# Patient Record
Sex: Male | Born: 1980 | Race: Black or African American | Hispanic: No | Smoking: Never smoker
Health system: Southern US, Community
[De-identification: ages and names within clinical notes are randomized; demographics above are authoritative.]

---

## 2019-09-28 DIAGNOSIS — E876 Hypokalemia: Secondary | ICD-10-CM | POA: Insufficient documentation

## 2019-09-28 DIAGNOSIS — I214 Non-ST elevation (NSTEMI) myocardial infarction: Secondary | ICD-10-CM | POA: Insufficient documentation

## 2019-09-28 DIAGNOSIS — I248 Other forms of acute ischemic heart disease: Secondary | ICD-10-CM | POA: Insufficient documentation

## 2019-09-28 HISTORY — DX: Non-ST elevation (NSTEMI) myocardial infarction: I21.4

## 2019-10-04 DIAGNOSIS — G009 Bacterial meningitis, unspecified: Secondary | ICD-10-CM | POA: Insufficient documentation

## 2019-11-03 ENCOUNTER — Encounter: Payer: Self-pay | Admitting: Neurology

## 2019-11-20 ENCOUNTER — Ambulatory Visit: Payer: Self-pay | Admitting: Neurology

## 2019-12-08 NOTE — Progress Notes (Signed)
NEUROLOGY CONSULTATION NOTE  Isaac [name] MRN: [id number] DOB: June 10, 1980  Referring provider: Andreas Blower, MD Primary care provider: No PCP  Reason for consult:  Bacterial meningitis  HISTORY OF PRESENT ILLNESS: Isaac [name]. [name] is a 39 year old right-handed male with history of cocaine use presents for bacterial meningitis.  History supplemented by hospital records.  He is accompanied by his fiancee who provides history.    He presented to the [facility] ED on 09/28/2019 with altered mental status and fever of 104 F, WBC 17.4 and lactate 4.7 after a day of generalized malaise, nausea, vomiting, weakness and headache.  He had elevated troponins over 5000.  Due to combativeness, he required sedation and subsequent intubation and transferred to [facility] where he underwent LP and diagnosed with pneumococcal meningitis.  He was started on ceftriaxone.  Due to possible seizure, he was started on Keppra and placed on continuous EEG which was negative for seizure activity.  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  He received a 5 day course of SoluMedrol for cerebral edema.  He then developed exteensor posturing with tachycardia and hypertension concerning for elevated intracranial pressure.  He underwent Bolt ICP monitoring which demonstrated no elevated ICP.  Follow up CT head and subsequent repeat MRI of brain showed small subacute hemorrhage in the right suboccipital lobe, either hemorrhagic conversion of small ischemic stroke vs extension of previously seen microhemorrhage, as well as hypoxic injury involving the bilateral caudate and putamen.  He also sustained a NSTEMI.  He was extubated on 10/10/2019 and underwent slow taper of Dilaudid to minimize withdrawal symptoms.  A G-tube was inserted on 10/16/2019 and he was discharged on 10/18/2019 under the care of his mother who is a CNA.    He has been going to outpatient PT/OT and has  been getting stronger.  He is still taking food and medication via G-tube.  He has not been able to establish care with a PCP yet.  He is easily angry and has kicked his mother.  He does not talk much and often when he speaks, it may be a curse word.  He is still incontinent.  He requires assistance with essential ADLs such as bathing, dressing and using toilet.  He sleeps well.    Testing: 07/25-26/2021 SERUM LABS:  Lactic acid 4.7, ammonia 20, ethanol negative, troponins 5105, CK 682, CK-MB 5.92, HIV nonreactive, Syphilis screen negative, COVID-19 PCR negative, Cryptococcal antigen negative, C3 and C4 102 and 29.1,  09/28/2019 Urine:  UDS positive for cannabinoids and benzodiazepines 09/28/2019 CT HEAD/CERVICAL SPINE:  1. No acute intracranial abnormalities. 2. No evidence for cervical spine fracture or dislocation.  09/28/2019 ECHOCARDIOGRAM:  1. The left ventricular systolic function is borderline, LVEF is visually estimated at 50%.  2. Definity contrast would clarify LV function.  3. Right ventricle is not well visualized but probably normal.  4. No evidence of an interatrial communication or intrapulmonary shunt.  5. No significant valvular abnormalities.  6. Technically difficult study due to body habitus.  09/29/2019 CSF:  Cell count 3560, 93% neutrophil, protein 187, glucose 13, gram stain  And culture negative, acid fast stain negative, VDRL nonreactive, cryptococcal ag negative, fungal culture negative, VZV PCR negative, ACE negative, HSV 1 & 2 PCR negative, cytology with severe acute inflammation but negative for malignant cells. 09/29/2019 CT H EAD WO:  New multifocal cerebral edema with progressive mild effacement of the ventricles. Recommend correlation with forthcoming  MRI.  09/29/2019 MRI BRAIN W WO:  1.Widespread acute microhemorrhagic leukoencephalopathy. Overall pattern is unusual, but given the perivascular distribution, is likely the result of infectious vasculitis.  2.Superimposed  findings of meningitis, including leptomeningeal signal abnormality and enhancement.  09/29/2019 CTA/CTV HEAD W WO:  1. Unremarkable CTA of the head. Specifically, no CT angiogram evidence of small or medium vessel vasculitis.  2. No venous thromboses.  3. Extensive supratentorial white matter hypodensities are noted and correspond to the extensive parenchymal abnormalities seen on recent brain MRI. 09/30/2019 VIDEO EEG:  Severe diffuse cerebral dysfunction, initially slightly worse over the left hemisphere, which is nonspecific for etiology but could be due in part to sedative effect in addition to known bilateral ischemic lesions   As the recording progressed, there is slight improvement in  background activity.  No seizures or epileptiform discharges.  There were occasional extensor posturing movements of both arms associated with up gaze lasting up to several minutes and no associated EEG change other than evidence of reactivity. Most of these episodes occurred in response to stimulation  10/11/2019 CT HEAD WO:  1.26 x 18 x 30 mm acute hemorrhage in the right parietal lobe the hemorrhage extends to the ependyma of the posterior right lateral ventricle. No intraventricular hemorrhage or subarachnoid hemorrhage is seen.  2.Tiny acute hemorrhage in the right frontal cortex and the tiny acute hemorrhage in the cortex of the posterior superior left temporal lobe. 10/12/2019 MRI BRAIN W WO:  1. Acute parenchymal hemorrhages right parietal lobe and superficial right frontal lobe similar to recent CT scan. Increase in number of superficial supratentorial microhemorrhages.  2. Development of restricted diffusion and T2/FLAIR hyperintensity bilateral basal ganglia indicating acute to early subacute insult such as hypoxic injury or toxic/metabolic insult. Appearance is not suggestive of parenchymal changes of infection.  3. Maturation of the extensive restricted diffusion throughout the supratentorial white matter now  seen as regions of enhancement and T2/FLAIR hyperintensity.  4. Leptomeningeal enhancement posteriorly at the vertex consistent with known meningitis; no abscess or space-occupying infected fluid collection.  10/15/2019 CT HEAD WO:  1. Evolving right parietal lobe parenchymal hematoma. The previously seen right frontal lobe hemorrhage is no longer definitively visualized. No new parenchymal hemorrhage.  2. Subtle areas of hypodensity within periventricular and subcortical white matter, corresponding to some of the areas of restricted diffusion and abnormal enhancement seen on prior MRI. However, these areas of white matter abnormality are much less conspicuous on CT compared to prior MR imaging.  3. No CT evidence of acute cortically based infarct.  10/15/2019 VIDEO EEG:  This EEG is abnormal secondary to moderate diffuse slowing.    PAST MEDICAL HISTORY: Past Medical History:  Diagnosis Date  . Bacterial meningitis 09/28/2019    PAST SURGICAL HISTORY: History reviewed. No pertinent surgical history.  MEDICATIONS: Keppra 750mg  twice daily  ALLERGIES: Not on File  FAMILY HISTORY: History reviewed. No pertinent family history..  SOCIAL HISTORY: Social History   Socioeconomic History  . Marital status: Significant Other    Spouse name: Not on file  . Number of children: Not on file  . Years of education: Not on file  . Highest education level: Not on file  Occupational History  . Not on file  Tobacco Use  . Smoking status: Never Smoker  . Smokeless tobacco: Never Used  Substance and Sexual Activity  . Alcohol use: Not Currently  . Drug use: Not Currently  . Sexual activity: Not on file  Other Topics Concern  .  Not on file  Social History Narrative   Right handed   Lives with mother right now in one story home.   Social Determinants of Health   Financial Resource Strain:   . Difficulty of Paying Living Expenses: Not on file  Food Insecurity:   . Worried About Community education officer in the Last Year: Not on file  . Ran Out of Food in the Last Year: Not on file  Transportation Needs:   . Lack of Transportation (Medical): Not on file  . Lack of Transportation (Non-Medical): Not on file  Physical Activity:   . Days of Exercise per Week: Not on file  . Minutes of Exercise per Session: Not on file  Stress:   . Feeling of Stress : Not on file  Social Connections:   . Frequency of Communication with Friends and Family: Not on file  . Frequency of Social Gatherings with Friends and Family: Not on file  . Attends Religious Services: Not on file  . Active Member of Clubs or Organizations: Not on file  . Attends Banker Meetings: Not on file  . Marital Status: Not on file  Intimate Partner Violence:   . Fear of Current or Ex-Partner: Not on file  . Emotionally Abused: Not on file  . Physically Abused: Not on file  . Sexually Abused: Not on file   PHYSICAL EXAM: Blood pressure 117/75, pulse 71, height 6\' 2"  (1.88 m), weight 180 lb (81.6 kg). General: No acute distress.  Patient appears well-groomed.  Head:  Normocephalic/atraumatic Eyes:  fundi examined but not visualized Neck: supple, no paraspinal tenderness, full range of motion Back: No paraspinal tenderness Heart: regular rate and rhythm Lungs: Clear to auscultation bilaterally. Vascular: No carotid bruits. Neurological Exam: Mental status: awake and alert.  Does not speak or follow commands. Cranial nerves: CN I: not tested CN II: pupils equal, round and reactive to light, blinks to threat bilaterally CN III, IV, VI:  full range of motion, no nystagmus, no ptosis CN V: unable to assess CN VII: upper and lower face symmetric CN VIII: unable to assess CN IX, X: unable to assess CN XI: unable to assess CN XII: unable to assess Bulk & Tone: mildly increased, no fasciculations. Motor:  Moves all extremities against gravity Sensation:  Unable to assess Deep Tendon Reflexes:  3+  throughout, 2+ lower extremities, toes downgoing Finger to nose testing:  Unable to assess Heel to shin:  Unable to assess Gait:  Deferred  IMPRESSION: Bacterial meningoencephalitis with chronic encephalopathy  PLAN: 1.  Continue Keppra 750mg  twice daily for now.  Will check routine EEG.  If there are epileptiform discharges, we will continue AED but would switch to lamotrigine (as Keppra may be contributing to irritability).  If EEG reveals no epileptiform discharges, we can try discontinuing AED but may also remain on it given scarring on brain from stroke (fiancee will discuss with patient's mother regarding whether to remain on AED or not) 2.  Will refer to physical medicine and rehab.  Will likely need a modified barium swallow to reassess swallowing and determine if G-tube can be discontinued. 3.  Continue PT/OT 4.  Patient must establish care with a PCP ASAP. 5.  Follow up in 4 months.  Thank you for allowing me to take part in the care of this patient.  , DO

## 2019-12-09 ENCOUNTER — Other Ambulatory Visit: Payer: Self-pay

## 2019-12-09 ENCOUNTER — Encounter: Payer: Self-pay | Admitting: Neurology

## 2019-12-09 ENCOUNTER — Ambulatory Visit (INDEPENDENT_AMBULATORY_CARE_PROVIDER_SITE_OTHER): Payer: Self-pay | Admitting: Neurology

## 2019-12-09 VITALS — BP 117/75 | HR 71 | Ht 74.0 in | Wt 180.0 lb

## 2019-12-09 DIAGNOSIS — G042 Bacterial meningoencephalitis and meningomyelitis, not elsewhere classified: Secondary | ICD-10-CM

## 2019-12-09 NOTE — Patient Instructions (Signed)
1.  Continue Keppra for now.  Will check an EEG.  If the EEG does not reveal any abnormalities that would suggest increased risk for seizures, we can stop the seizure medication.  If you wish to remain on a seizure medication, then I would switch to another seizure medication such as lamotrigine (as Keppra may cause irritability) 2.  I will refer you to physical medicine and rehabilitation 3.  Continue physical therapy and occupational therapy 4.  Follow up in 4 months.

## 2019-12-10 ENCOUNTER — Ambulatory Visit (INDEPENDENT_AMBULATORY_CARE_PROVIDER_SITE_OTHER): Payer: Self-pay | Admitting: Neurology

## 2019-12-10 DIAGNOSIS — G042 Bacterial meningoencephalitis and meningomyelitis, not elsewhere classified: Secondary | ICD-10-CM

## 2019-12-12 ENCOUNTER — Telehealth: Payer: Self-pay | Admitting: Neurology

## 2019-12-12 NOTE — Telephone Encounter (Signed)
Advised to call back Monday for results.

## 2019-12-12 NOTE — Telephone Encounter (Signed)
Patient's mom called to see if EEG results are ready from 12/10/19.

## 2019-12-15 NOTE — Progress Notes (Signed)
ELECTROENCEPHALOGRAM REPORT  Date of Study: 12/10/2019  Patient's Name: Isaac [name] MRN: [id number] Date of Birth: [birth year]   Clinical History: 39 year old male with recent bacterial meningoencephalitis and stroke.  Medications: Keppra   Technical Summary: A multichannel digital EEG recording measured by the international 10-20 system with electrodes applied with paste and impedances below 5000 ohms performed in our laboratory with EKG monitoring in an awake and drowsy patient.  Hyperventilation was not performed as patient wearing face mask due to the COVID-19 pandemic.  Photic stimulation was performed.  The digital EEG was referentially recorded, reformatted, and digitally filtered in a variety of bipolar and referential montages for optimal display.    Description: The patient is awake and drowsy during the recording.  During maximal wakefulness, there is a symmetric, medium voltage 4 Hz posterior dominant rhythm that attenuates with eye opening.  The record is symmetric with diffuse 3-4 Hz delta slowing intermixed with theta slowing.  Stage 2 sleep is not seen.  There were no epileptiform discharges or electrographic seizures seen.  Photic stimulation did not produce abnormalities.  EKG lead was unremarkable.  Impression: This awake and drowsy EEG is abnormal due to generalized slowing.    Clinical Correlation: The above findings may be seen in encephalopathy or other diffuse cerebral dysfunction.   Shon Millet, DO

## 2019-12-15 NOTE — Telephone Encounter (Signed)
Patient's mom called in again to see about getting the EEG results

## 2019-12-15 NOTE — Telephone Encounter (Signed)
EEG showed slowing of the brain waves due to the meningitis but did not reveal any seizures or abnormalities specifically suggesting increased risk for seizures.

## 2019-12-16 ENCOUNTER — Other Ambulatory Visit: Payer: Self-pay | Admitting: Neurology

## 2019-12-16 NOTE — Telephone Encounter (Signed)
Mother would like remain on AED medication, but change the [place].

## 2019-12-16 NOTE — Telephone Encounter (Signed)
Pt mother wanted  To know if DR. Everlena Cooper is stopping the AED medication or not.   Please advise?

## 2019-12-16 NOTE — Telephone Encounter (Signed)
His fiancee said that she was going to discuss it with his mother about what to do.  There is no right answer.  If everybody would rather he not take the AED, we can stop it, although it may increase risk for a seizure.  If everybody feels more comfortable that he remain on an AED, then I would change from Keppra to a different medication as the Keppra may be contributing to irritability.

## 2019-12-16 NOTE — Telephone Encounter (Signed)
Patient's mother called in and left a message. She wanted to get EEG results

## 2019-12-16 NOTE — Telephone Encounter (Signed)
He will remain on Keppra for now. Start lamotrigine 25mg  tablet:         Take 1 tablet daily for 14 days         Then 1 tablet twice daily for 14 days         Then 2 tablets twice daily 3.  Once he starts 2 tablets twice daily, they are to contact for refill and instructions on how to taper off of Keppra.

## 2019-12-17 ENCOUNTER — Other Ambulatory Visit: Payer: Self-pay | Admitting: Neurology

## 2019-12-17 MED ORDER — LEVETIRACETAM 100 MG/ML PO SOLN
ORAL | 1 refills | Status: DC
Start: 2019-12-17 — End: 2020-02-23

## 2019-12-17 NOTE — Telephone Encounter (Signed)
Pharmacy listed.

## 2019-12-17 NOTE — Telephone Encounter (Signed)
Prescription for Keppra sent.

## 2019-12-17 NOTE — Telephone Encounter (Signed)
The lamictal tablet can be crushed and given via feeding tube.  We can refill the keppra however there is no pharmacy listed.

## 2019-12-17 NOTE — Telephone Encounter (Signed)
Telephone call to the pt mother. Pt has a feeding tube. Pt unable to swallow right now. Is there a way to have the medication in a solution  form, as well as he needs a refill on the Keppra?

## 2019-12-18 ENCOUNTER — Other Ambulatory Visit: Payer: Self-pay | Admitting: Neurology

## 2019-12-18 MED ORDER — LAMOTRIGINE 25 MG PO TABS: ORAL_TABLET | ORAL | 0 refills | Status: DC

## 2019-12-18 NOTE — Telephone Encounter (Signed)
Script for lamictal sent.

## 2019-12-18 NOTE — Telephone Encounter (Signed)
Per last note Dr.Jaffe needs to send a script for Lamictal

## 2019-12-19 ENCOUNTER — Encounter: Payer: Self-pay | Admitting: Physical Medicine and Rehabilitation

## 2019-12-24 ENCOUNTER — Telehealth: Payer: Self-pay | Admitting: Neurology

## 2019-12-24 NOTE — Telephone Encounter (Signed)
Patient's mom called and said, "Isaac [name] has been really hard to handle since he started taking Lamotrigine 25 MG. He is overly aggressive. Yesterday, we could not get him out of the care for physical therapy. I need some help."

## 2019-12-25 NOTE — Telephone Encounter (Signed)
Advised pt mother of Dr. Everlena Cooper as well as to take Keppra once a day for a week then stop.

## 2019-12-25 NOTE — Telephone Encounter (Signed)
Pt mother called back to see what they need to do. The  Patient has become more aggressive towards them since starting the new medication.   Please advise.

## 2019-12-25 NOTE — Telephone Encounter (Signed)
Lamictal is a mood stabilizer.  It should actually help his mood.  I think it is still the Keppra that is contributing to the agitation.  If possible, I would like to continue titrating up on the lamictal and then we can take him off of the Keppra and see how he does.

## 2019-12-29 ENCOUNTER — Telehealth: Payer: Self-pay | Admitting: Neurology

## 2019-12-30 ENCOUNTER — Other Ambulatory Visit: Payer: Self-pay | Admitting: Neurology

## 2019-12-30 MED ORDER — QUETIAPINE FUMARATE 25 MG PO TABS: ORAL_TABLET | ORAL | 5 refills | Status: DC

## 2019-12-30 NOTE — Telephone Encounter (Signed)
I called and spoke with Mr. Hommel mother.  I reviewed his hospital encounter.  I have sent a prescription for Seroquel 25mg  twice daily.  She has indicated that it is difficult to care for him and she needs help.  We will look into options

## 2019-12-30 NOTE — Telephone Encounter (Signed)
Patient's mom Chyrl Civatte called to report the patient was started on 25 MG of generic Seroquel while in the hospital recently. She'd like to know if it is okay for the patient to continue taking it. If so, he will need a prescription for the medication.   Patient's mom also said, "We are really at our ropes end and not sure what to do for [facility]. Is there any way a nurse or the doctor can call me directly?"  [facility] in [place]

## 2019-12-31 DIAGNOSIS — Z931 Gastrostomy status: Secondary | ICD-10-CM

## 2019-12-31 DIAGNOSIS — R4689 Other symptoms and signs involving appearance and behavior: Secondary | ICD-10-CM | POA: Diagnosis present

## 2019-12-31 DIAGNOSIS — Z978 Presence of other specified devices: Secondary | ICD-10-CM | POA: Insufficient documentation

## 2020-01-01 ENCOUNTER — Emergency Department (HOSPITAL_COMMUNITY): Payer: Medicaid Other

## 2020-01-01 ENCOUNTER — Encounter (HOSPITAL_COMMUNITY): Payer: Self-pay

## 2020-01-01 ENCOUNTER — Inpatient Hospital Stay (HOSPITAL_COMMUNITY)
Admission: EM | Admit: 2020-01-01 | Discharge: 2020-02-23 | DRG: 071 | Disposition: A | Discharge status: Home / Self Care | Payer: Medicaid Other | Attending: Internal Medicine | Admitting: Internal Medicine

## 2020-01-01 DIAGNOSIS — W06XXXA Fall from bed, initial encounter: Secondary | ICD-10-CM | POA: Diagnosis not present

## 2020-01-01 DIAGNOSIS — R1310 Dysphagia, unspecified: Secondary | ICD-10-CM | POA: Diagnosis present

## 2020-01-01 DIAGNOSIS — Z79899 Other long term (current) drug therapy: Secondary | ICD-10-CM

## 2020-01-01 DIAGNOSIS — N390 Urinary tract infection, site not specified: Secondary | ICD-10-CM | POA: Diagnosis present

## 2020-01-01 DIAGNOSIS — R296 Repeated falls: Secondary | ICD-10-CM | POA: Diagnosis present

## 2020-01-01 DIAGNOSIS — G931 Anoxic brain damage, not elsewhere classified: Secondary | ICD-10-CM | POA: Diagnosis present

## 2020-01-01 DIAGNOSIS — I252 Old myocardial infarction: Secondary | ICD-10-CM | POA: Diagnosis not present

## 2020-01-01 DIAGNOSIS — F919 Conduct disorder, unspecified: Secondary | ICD-10-CM | POA: Diagnosis present

## 2020-01-01 DIAGNOSIS — R9431 Abnormal electrocardiogram [ECG] [EKG]: Secondary | ICD-10-CM | POA: Diagnosis present

## 2020-01-01 DIAGNOSIS — R4689 Other symptoms and signs involving appearance and behavior: Secondary | ICD-10-CM | POA: Diagnosis not present

## 2020-01-01 DIAGNOSIS — I471 Supraventricular tachycardia: Secondary | ICD-10-CM | POA: Diagnosis not present

## 2020-01-01 DIAGNOSIS — R627 Adult failure to thrive: Secondary | ICD-10-CM | POA: Diagnosis present

## 2020-01-01 DIAGNOSIS — Z978 Presence of other specified devices: Secondary | ICD-10-CM | POA: Diagnosis not present

## 2020-01-01 DIAGNOSIS — Z6823 Body mass index (BMI) 23.0-23.9, adult: Secondary | ICD-10-CM

## 2020-01-01 DIAGNOSIS — Z931 Gastrostomy status: Secondary | ICD-10-CM | POA: Diagnosis not present

## 2020-01-01 DIAGNOSIS — N179 Acute kidney failure, unspecified: Secondary | ICD-10-CM | POA: Diagnosis present

## 2020-01-01 DIAGNOSIS — Z20822 Contact with and (suspected) exposure to covid-19: Secondary | ICD-10-CM | POA: Diagnosis present

## 2020-01-01 DIAGNOSIS — R451 Restlessness and agitation: Secondary | ICD-10-CM

## 2020-01-01 DIAGNOSIS — Z8661 Personal history of infections of the central nervous system: Secondary | ICD-10-CM | POA: Diagnosis not present

## 2020-01-01 DIAGNOSIS — W19XXXA Unspecified fall, initial encounter: Secondary | ICD-10-CM

## 2020-01-01 DIAGNOSIS — F1410 Cocaine abuse, uncomplicated: Secondary | ICD-10-CM | POA: Clinically undetermined

## 2020-01-01 DIAGNOSIS — D509 Iron deficiency anemia, unspecified: Secondary | ICD-10-CM | POA: Diagnosis not present

## 2020-01-01 DIAGNOSIS — I69319 Unspecified symptoms and signs involving cognitive functions following cerebral infarction: Secondary | ICD-10-CM

## 2020-01-01 DIAGNOSIS — M25522 Pain in left elbow: Secondary | ICD-10-CM | POA: Diagnosis not present

## 2020-01-01 DIAGNOSIS — N3001 Acute cystitis with hematuria: Secondary | ICD-10-CM

## 2020-01-01 DIAGNOSIS — G9341 Metabolic encephalopathy: Principal | ICD-10-CM | POA: Diagnosis present

## 2020-01-01 DIAGNOSIS — I10 Essential (primary) hypertension: Secondary | ICD-10-CM | POA: Diagnosis present

## 2020-01-01 DIAGNOSIS — D649 Anemia, unspecified: Secondary | ICD-10-CM | POA: Diagnosis present

## 2020-01-01 DIAGNOSIS — Z781 Physical restraint status: Secondary | ICD-10-CM

## 2020-01-01 HISTORY — DX: Personal history of infections of the central nervous system: Z86.61

## 2020-01-01 LAB — COMPREHENSIVE METABOLIC PANEL
ALT: 15 U/L (ref 0–44)
AST: 14 U/L — ABNORMAL LOW (ref 15–41)
Albumin: 4 g/dL (ref 3.5–5.0)
Alkaline Phosphatase: 46 U/L (ref 38–126)
Anion gap: 12 (ref 5–15)
BUN: 7 mg/dL (ref 6–20)
CO2: 26 mmol/L (ref 22–32)
Calcium: 9.3 mg/dL (ref 8.9–10.3)
Chloride: 98 mmol/L (ref 98–111)
Creatinine, Ser: 0.6 mg/dL — ABNORMAL LOW (ref 0.61–1.24)
GFR, Estimated: >60 mL/min (ref >60)
Glucose, Bld: 83 mg/dL (ref 70–99)
Potassium: 4 mmol/L (ref 3.5–5.1)
Sodium: 136 mmol/L (ref 135–145)
Total Bilirubin: 0.5 mg/dL (ref 0.3–1.2)
Total Protein: 7.3 g/dL (ref 6.5–8.1)

## 2020-01-01 LAB — CBC WITH DIFFERENTIAL/PLATELET
Abs Immature Granulocytes: 0.02 10*3/uL (ref 0.00–0.07)
Basophils Absolute: 0 10*3/uL (ref 0.0–0.1)
Basophils Relative: 1 %
Eosinophils Absolute: 0.2 10*3/uL (ref 0.0–0.5)
Eosinophils Relative: 3 %
HCT: 34.7 % — ABNORMAL LOW (ref 39.0–52.0)
Hemoglobin: 11.2 g/dL — ABNORMAL LOW (ref 13.0–17.0)
Immature Granulocytes: 0 %
Lymphocytes Relative: 36 %
Lymphs Abs: 2.5 10*3/uL (ref 0.7–4.0)
MCH: 29.5 pg (ref 26.0–34.0)
MCHC: 32.3 g/dL (ref 30.0–36.0)
MCV: 91.3 fL (ref 80.0–100.0)
Monocytes Absolute: 0.7 10*3/uL (ref 0.1–1.0)
Monocytes Relative: 10 %
Neutro Abs: 3.6 10*3/uL (ref 1.7–7.7)
Neutrophils Relative %: 50 %
Platelets: 280 10*3/uL (ref 150–400)
RBC: 3.8 MIL/uL — ABNORMAL LOW (ref 4.22–5.81)
RDW: 13.3 % (ref 11.5–15.5)
WBC: 7 10*3/uL (ref 4.0–10.5)
nRBC: 0 % (ref 0.0–0.2)

## 2020-01-01 LAB — URINALYSIS, ROUTINE W REFLEX MICROSCOPIC
Bilirubin Urine: NEGATIVE
Bilirubin Urine: NEGATIVE
Glucose, UA: NEGATIVE mg/dL
Glucose, UA: NEGATIVE mg/dL
Ketones, ur: 5 mg/dL — AB
Ketones, ur: 20 mg/dL — AB
Nitrite: POSITIVE — AB
Nitrite: NEGATIVE
Protein, ur: NEGATIVE mg/dL
Protein, ur: 30 mg/dL — AB
Specific Gravity, Urine: 1.003 — ABNORMAL LOW (ref 1.005–1.030)
Specific Gravity, Urine: 1.003 — ABNORMAL LOW (ref 1.005–1.030)
pH: 7 (ref 5.0–8.0)
pH: 6 (ref 5.0–8.0)

## 2020-01-01 LAB — LACTIC ACID, PLASMA
Lactic Acid, Venous: 1 mmol/L (ref 0.5–1.9)
Lactic Acid, Venous: 1.2 mmol/L (ref 0.5–1.9)

## 2020-01-01 LAB — TROPONIN I (HIGH SENSITIVITY): Troponin I (High Sensitivity): 3 ng/L (ref <18)

## 2020-01-01 LAB — RESPIRATORY PANEL BY RT PCR (FLU A&B, COVID)
Influenza A by PCR: NEGATIVE
Influenza B by PCR: NEGATIVE
SARS Coronavirus 2 by RT PCR: NEGATIVE

## 2020-01-01 LAB — PHOSPHORUS: Phosphorus: 4.5 mg/dL (ref 2.5–4.6)

## 2020-01-01 LAB — MAGNESIUM: Magnesium: 2.1 mg/dL (ref 1.7–2.4)

## 2020-01-01 MED ORDER — SODIUM CHLORIDE 0.9 % IV SOLN: INTRAVENOUS | Status: DC

## 2020-01-01 MED ORDER — DEXTROSE-NACL 5-0.9 % IV SOLN: INTRAVENOUS | Status: DC

## 2020-01-01 MED ORDER — ENOXAPARIN SODIUM 40 MG/0.4ML ~~LOC~~ SOLN
40.0000 mg | SUBCUTANEOUS | Status: DC
  Administered 2020-01-03 – 2020-02-01 (×10): 40 mg via SUBCUTANEOUS
  Filled 2020-01-01 (×24): qty 0.4

## 2020-01-01 MED ORDER — STERILE WATER FOR INJECTION IJ SOLN
INTRAMUSCULAR | Status: AC
  Filled 2020-01-01: qty 10

## 2020-01-01 MED ORDER — LORAZEPAM 2 MG/ML IJ SOLN
0.5000 mg | Freq: Once | INTRAMUSCULAR | Status: AC
  Administered 2020-01-01: 0.5 mg via INTRAVENOUS
  Filled 2020-01-01: qty 1

## 2020-01-01 MED ORDER — SODIUM CHLORIDE 0.9 % IV SOLN
1.0000 g | INTRAVENOUS | Status: DC
  Administered 2020-01-04 (×2): 1 g via INTRAVENOUS
  Filled 2020-01-01 (×4): qty 10

## 2020-01-01 MED ORDER — ZIPRASIDONE MESYLATE 20 MG IM SOLR
20.0000 mg | Freq: Once | INTRAMUSCULAR | Status: AC
  Administered 2020-01-01: 20 mg via INTRAMUSCULAR
  Filled 2020-01-01: qty 20

## 2020-01-01 MED ORDER — IOHEXOL 350 MG/ML SOLN
100.0000 mL | Freq: Once | INTRAVENOUS | Status: AC | PRN
  Administered 2020-01-01: 100 mL via INTRAVENOUS

## 2020-01-01 MED ORDER — ONDANSETRON HCL 4 MG/2ML IJ SOLN: 4.0000 mg | Freq: Four times a day (QID) | INTRAMUSCULAR | Status: DC | PRN

## 2020-01-01 MED ORDER — ONDANSETRON HCL 4 MG PO TABS: 4.0000 mg | ORAL_TABLET | Freq: Four times a day (QID) | ORAL | Status: DC | PRN

## 2020-01-01 MED ORDER — ACETAMINOPHEN 650 MG RE SUPP: 650.0000 mg | Freq: Four times a day (QID) | RECTAL | Status: DC | PRN

## 2020-01-01 MED ORDER — LACTATED RINGERS IV BOLUS
500.0000 mL | Freq: Once | INTRAVENOUS | Status: AC
  Administered 2020-01-01: 500 mL via INTRAVENOUS

## 2020-01-01 MED ORDER — LACTATED RINGERS IV BOLUS
1000.0000 mL | Freq: Once | INTRAVENOUS | Status: AC
  Administered 2020-01-02: 1000 mL via INTRAVENOUS

## 2020-01-01 MED ORDER — SODIUM CHLORIDE 0.9 % IV SOLN
1.0000 g | Freq: Once | INTRAVENOUS | Status: AC
  Administered 2020-01-01: 1 g via INTRAVENOUS
  Filled 2020-01-01: qty 10

## 2020-01-01 MED ORDER — ACETAMINOPHEN 325 MG PO TABS
650.0000 mg | ORAL_TABLET | Freq: Four times a day (QID) | ORAL | Status: DC | PRN
  Administered 2020-01-12 – 2020-02-14 (×9): 650 mg via ORAL
  Filled 2020-01-01 (×9): qty 2

## 2020-01-01 NOTE — BHH Counselor (Signed)
TTS attempted to see patient, he is non verbal and patient was not roused at this time.

## 2020-01-01 NOTE — ED Notes (Signed)
Call patient's mother with updates. [phone]

## 2020-01-01 NOTE — Telephone Encounter (Signed)
Telephone call to pt Mother Chyrl Civatte, Choice Care Navigator information given.
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
## 2020-01-01 NOTE — ED Notes (Signed)
Attempted to in and out cath pt for urine sample. He yells and curses at staff and swings his fists. Aborted cath attempt and provider notified
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
## 2020-01-02 LAB — CBC
HCT: 32.3 % — ABNORMAL LOW (ref 39.0–52.0)
Hemoglobin: 10.4 g/dL — ABNORMAL LOW (ref 13.0–17.0)
MCH: 30.1 pg (ref 26.0–34.0)
MCHC: 32.2 g/dL (ref 30.0–36.0)
MCV: 93.4 fL (ref 80.0–100.0)
Platelets: 273 10*3/uL (ref 150–400)
RBC: 3.46 MIL/uL — ABNORMAL LOW (ref 4.22–5.81)
RDW: 13.4 % (ref 11.5–15.5)
WBC: 8.5 10*3/uL (ref 4.0–10.5)
nRBC: 0 % (ref 0.0–0.2)

## 2020-01-02 LAB — HIV ANTIBODY (ROUTINE TESTING W REFLEX): HIV Screen 4th Generation wRfx: NONREACTIVE

## 2020-01-02 LAB — TROPONIN I (HIGH SENSITIVITY): Troponin I (High Sensitivity): 3 ng/L (ref <18)

## 2020-01-02 MED ORDER — HALOPERIDOL LACTATE 5 MG/ML IJ SOLN
5.0000 mg | Freq: Four times a day (QID) | INTRAMUSCULAR | Status: DC | PRN
  Administered 2020-01-06 – 2020-02-07 (×16): 5 mg via INTRAMUSCULAR
  Filled 2020-01-02 (×18): qty 1

## 2020-01-02 MED ORDER — ARIPIPRAZOLE 5 MG PO TABS: 5.0000 mg | ORAL_TABLET | Freq: Every day | ORAL | Status: DC

## 2020-01-02 MED ORDER — LAMOTRIGINE 100 MG PO TABS
100.0000 mg | ORAL_TABLET | Freq: Every day | ORAL | Status: DC
  Administered 2020-01-04 – 2020-02-23 (×51): 100 mg via ORAL
  Filled 2020-01-02 (×55): qty 1

## 2020-01-02 MED ORDER — LORAZEPAM 2 MG/ML IJ SOLN
1.0000 mg | Freq: Four times a day (QID) | INTRAMUSCULAR | Status: DC | PRN
  Administered 2020-01-03: 1 mg via INTRAVENOUS
  Filled 2020-01-02: qty 1

## 2020-01-02 MED ORDER — QUETIAPINE FUMARATE 25 MG PO TABS: 25.0000 mg | ORAL_TABLET | Freq: Every day | ORAL | Status: DC

## 2020-01-02 MED ORDER — ARIPIPRAZOLE 5 MG PO TABS
5.0000 mg | ORAL_TABLET | Freq: Two times a day (BID) | ORAL | Status: DC
  Administered 2020-01-04 – 2020-02-03 (×59): 5 mg via ORAL
  Filled 2020-01-02 (×63): qty 1

## 2020-01-02 NOTE — Progress Notes (Signed)
Unable to administer scheduled medications due to IV infiltration/removal and pt hypersensitivity to touch.

## 2020-01-02 NOTE — TOC Progression Note (Addendum)
Transition of Care [facility]) - Progression Note   Patient Details  Name: Saif Peter MRN: [id number] Date of Birth: 10/14/1980  [facility] of [facility]) CM/SW Contact  Ewing Schlein, LCSW Phone Number: 01/02/2020, 1:16 PM  Clinical Narrative: CSW spoke with Lajoyce Lauber with DSS regarding the Medicaid and social security applications status. Per Ms. Minter, she can only release information to the patient's mother. CSW spoke with mother and requested that she follow up regarding both applications. CSW updated by mother the Medicaid and disability applications are both pending.  CSW spoke with Eunice Blase with [facility] regarding referral for LTC. Per Eunice Blase, the [facility] will need to be reviewed by corporate. CSW spoke with Jill Side with [facility] and [facility] unable to make bed offer while disability is pending. CSW spoke with [facility]. Per Ms. Emelia Loron, she will review the FL2, but the patient will need an LOG and requested CSW fax the referral to [facility]. CSW left voicemail for Melissa in admissions with [facility] regarding referrals. CSW received voicemail from [facility] that [facility] will review the referral. CSW called [facility] and left a voicemail for [employer] in admissions.  FL2 completed and faxed to [facility], [facility], [facility], [facility], and the [facility] facilities in the hub. TOC to follow.  Addendum: 1:44pm-CSW notified by Reyne Dumas the patient is not a good fit for their facilities.  4:25pm: CSW received call from [facility] with [facility]. Per Ms. Ross, the facility is unable to take new patients at this time and requested that TOC follow up with Tanya in admissions at a later date.  Barriers to Discharge: ED Facility/Family Refusing to Allow Patient to Return  Readmission Risk Interventions No flowsheet data found.

## 2020-01-02 NOTE — Progress Notes (Signed)
Initial Nutrition Assessment  DOCUMENTATION CODES:   Not applicable  INTERVENTION:  If unable to advance diet, recommend resuming bolus feedings -1 carton (237 ml) Osmolite 1.5 every 4 hours with 45 ml Prosource TF BID via PEG tube -80 ml free water flush before and after each feeding  This regimen will provide 2210 kcal, 111 grams protein, and 1086 free water from formula (2046 ml total free water with flushes)   NUTRITION DIAGNOSIS:   Inadequate oral intake related to inability to eat as evidenced by  (PEG tube dependent for nutrition/hydration and medications s/p multiple subacute strokes with residual weakness secondary to recent bacterial meningitis).    GOAL:   Patient will meet greater than or equal to 90% of their needs    MONITOR:   PO intake, Weight trends, Labs, Diet advancement, I & O's  REASON FOR ASSESSMENT:   Rounds    ASSESSMENT:  39 year old male with history of recent hospitalization for bacterial meningitis (7/24-8/14). 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 Pt presented with progressively worsening mental status, increased agitation and family reports unable to safely manage him at home.  Pt admitted with acute UTI  Pt calm and awake this afternoon, mittens and safety restraints in place. Mother of pt present at bedside. Lunch tray in room, mother reports pt is PEG dependent for nutrition/hydration and medications. Pt tolerates tube feedings well, he denies abdominal discomfort, nausea, vomiting, or diarrhea. Mother states hopeful of pt progression to po intake and having feeding tube removed, however due to increased agitation pt has not been participating therapy. Spoke with MD via secure chat, have changed diet to NPO pending SLP evaluation. If unable to advance diet, recommend resuming bolus feedings with  regimen provided above.   Mother endorsed ~30 lb wt loss during prior hospitalization, recalls usual wt around 215 lbs. He has been maintaining 180-185 lb since initiating tube feedings. He appears well nourished, no moderate/severe depletions, however limited exam performed d/t pt level of comfort when being touched.   Home TF regimen: Jevity 1.5 x 6 cartons/day with 90 ml water flush before and after each feeding. -Provides 2130 kcal, 91 grams of protein, and 1080 ml H20 from formula (1800 ml total water with flushes). Jevity 1.5 is not stocked at [facility], if bolus feedings are resumed, will substitute with Osmolite 1.5.  Medications reviewed and include: Rocephin D5 NaCl @ 100 ml/hr Labs reviewed   NUTRITION - FOCUSED PHYSICAL EXAM: Mild orbital and buccal fat depletion  Diet Order:   Diet Order            Diet NPO time specified  Diet effective now                 EDUCATION NEEDS:   Education needs have been addressed  Skin:  Skin Assessment: Reviewed RN Assessment  Last BM:  pta  Height:   Ht Readings from Last 1 Encounters:  01/02/20 6\' 2"  (1.88 m)    Weight:   Wt Readings from Last 1 Encounters:  01/02/20 83.2 kg    BMI:  Body mass index is 23.55 kg/m.  Estimated Nutritional Needs:   Kcal:  2080-2330  Protein:  105-115  Fluid:  >/= 2 L/day   06-10-1970, RD, LDN Clinical Nutrition After Hours/Weekend Pager # in Amion

## 2020-01-02 NOTE — Progress Notes (Signed)
Pt mother called to check on pt, advised pt resting and no combative activity

## 2020-01-02 NOTE — NC FL2 (Signed)
[place] MEDICAID FL2 LEVEL OF CARE SCREENING TOOL     IDENTIFICATION  Patient Name: Isaac [name] Birthdate: February 19, 1981 Sex: male Admission Date (Current Location): 01/01/2020  [facility] and [place] Number:  Engineer, maintenance and Address:  [facility],  [address] [place] [address]      Provider Number: [id number]  Attending Physician Name and Address:  Shon Hale, MD  Relative Name and Phone Number:  Kristine Garbe (mother) Ph: [phone]    Current Level of Care: Hospital Recommended Level of Care: Other (Comment) (Long-term care facility) Prior Approval Number:    Date Approved/Denied:   PASRR Number:    Discharge Plan: Other (Comment) (Long-term care facility)    Current Diagnoses: Patient Active Problem List   Diagnosis Date Noted  . Acute urinary tract infection 01/01/2020  . History of bacterial meningitis 01/01/2020  . Abnormal ECG 01/01/2020  . Normocytic anemia 01/01/2020  . Combative behavior 12/31/2019  . Uses feeding tube 12/31/2019  . Bacterial meningitis 10/04/2019  . NSTEMI (non-ST elevated myocardial infarction) (HCC) 09/28/2019  . Demand ischemia (HCC) 09/28/2019    Orientation RESPIRATION BLADDER Height & Weight      (Unable to assess as patient is nonverbal)  Normal Incontinent Weight: 183 lb 6.8 oz (83.2 kg) Height:  6\' 2"  (188 cm)  BEHAVIORAL SYMPTOMS/MOOD NEUROLOGICAL BOWEL NUTRITION STATUS  Other (Comment) (Mother reports some combativeness, but this was not observed in the hospital)  (Patient has history of bacterial meningitis) Incontinent Diet, Feeding tube (Heart healthy diet; patient has PEG tube)  AMBULATORY STATUS COMMUNICATION OF NEEDS Skin   Total Care Does not communicate Normal                       Personal Care Assistance Level of Assistance  Bathing, Feeding, Dressing, Total care Bathing Assistance: Maximum assistance Feeding assistance: Maximum assistance Dressing Assistance:  Maximum assistance Total Care Assistance: Maximum assistance   Functional Limitations Info  Sight, Hearing, Speech Sight Info: Adequate Hearing Info: Adequate Speech Info: Impaired (Patient is nonverbal)    SPECIAL CARE FACTORS FREQUENCY                       Contractures Contractures Info: Not present    Additional Factors Info  Code Status, Allergies Code Status Info: Full Allergies Info: NKA           Current Medications (01/02/2020):  This is the current hospital active medication list Current Facility-Administered Medications  Medication Dose Route Frequency Provider Last Rate Last Admin  . acetaminophen (TYLENOL) tablet 650 mg  650 mg Oral Q6H PRN 01/04/2020, MD       Or  . acetaminophen (TYLENOL) suppository 650 mg  650 mg Rectal Q6H PRN Bobette Mo, MD      . cefTRIAXone (ROCEPHIN) 1 g in sodium chloride 0.9 % 100 mL IVPB  1 g Intravenous Q24H Bobette Mo, MD      . dextrose 5 %-0.9 % sodium chloride infusion   Intravenous Continuous Bobette Mo, MD 100 mL/hr at 01/02/20 0338 New Bag at 01/02/20 01/04/20  . enoxaparin (LOVENOX) injection 40 mg  40 mg Subcutaneous Q24H 6314, MD      . ondansetron [facility]) tablet 4 mg  4 mg Oral Q6H PRN [facility], MD       Or  . ondansetron [facility]) injection 4 mg  4 mg Intravenous Q6H PRN [facility], MD  Discharge Medications: Please see discharge summary for a list of discharge medications.  Relevant Imaging Results:  Relevant Lab Results:   Additional Information SSN: [id number]  Ewing Schlein, LCSW

## 2020-01-02 NOTE — [facility] Counselor (Signed)
Contacted [facility] Charge RN- she confirms patient is to be moved upstairs. Requested TTS consult be pulled until medically clear.

## 2020-01-02 NOTE — Progress Notes (Signed)
Patient Demographics:    Isaac [name], is a 39 y.o. male, DOB - 1981-02-12, LGX:[id number]  Admit date - 01/01/2020   Admitting Physician Bobette Mo, MD  Outpatient Primary MD for the patient is Pcp, No  LOS - 1   Chief Complaint  Patient presents with  . 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 - -Also had history of NSTEMI due to demand ischemia during his meningitis hospitalization   -In July 2021 at [facility] where he underwent LP and diagnosed with pneumococcal meningitis, while at [facility] in July 2021 for pneumococcal meningitis patient was found to have 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 - He is easily angry, gets agitated and may kick and punch.   He does not talk much.  He is still incontinent.  He requires  assistance with essential ADLs such as bathing, dressing and using toilet.   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 pending further evaluation by speech pathologist and dietitian  4)Social/Ethics-patient's mother was a trained CNA is no longer able to manage him at home due to escalating behaviors --Patient remains total care- He is still taking food and medication via G-tube. - He is easily angry, gets agitated and may kick and punch.   He does not talk much.  He is still incontinent.  He requires assistance with essential ADLs such as bathing, dressing and using toilet -Family requesting possible placement to facility -Social work consult to help with placement requested  5) chronic anemia-multifactorial , partly nutritional, expect further drop in H&H with hydration/hemodilution  6) presumed UTI--patient does not meet sepsis criteria -treat empirically with IV Rocephin pending culture  data  Disposition/Need for in-Hospital Stay- patient unable to be discharged at this time due to --UTI requiring IV antibiotics and behavioral problems requiring medication adjustments, --At this time patient does not have a safe discharge plan, patient's mother unable to manage him at home, needs SNF facility  Status is: Inpatient  Remains inpatient appropriate because:UTI  requiring IV antibiotics and behavioral problems requiring medication adjustments,   Disposition: The patient is from: Home              Anticipated d/c is to: SNF              Anticipated d/c date is: 2 days              Patient currently is not medically stable to d/c. Barriers: Not Clinically Stable- -UTI requiring IV antibiotics and behavioral problems requiring medication adjustments, --At this time patient does not have a safe discharge plan, patient's mother unable to manage him at home, needs SNF facility  Code Status : Full   Family Communication: Discussed with patient's mother at bedside Consults  :  na  DVT Prophylaxis  :  Lovenox -   - SCDs   Lab Results  Component Value Date   PLT 273 01/02/2020    Inpatient Medications  Scheduled Meds: . ARIPiprazole  5 mg Oral QHS  . enoxaparin (LOVENOX) injection  40 mg Subcutaneous Q24H   Continuous Infusions: . cefTRIAXone (ROCEPHIN)  IV    . dextrose 5 % and 0.9% NaCl 100 mL/hr at 01/02/20 0338   PRN Meds:.acetaminophen **OR** acetaminophen, haloperidol lactate, LORazepam, ondansetron **OR** ondansetron (ZOFRAN) IV    Anti-infectives (From admission, onward)   Start     Dose/Rate Route Frequency Ordered Stop   01/02/20 2200  cefTRIAXone (ROCEPHIN) 1 g in sodium chloride 0.9 % 100 mL IVPB        1 g 200 mL/hr over 30 Minutes Intravenous Every 24 hours 01/01/20 2228     01/01/20 2200  cefTRIAXone (ROCEPHIN) 1 g in sodium chloride 0.9 % 100 mL IVPB        1 g 200 mL/hr over 30 Minutes Intravenous  Once 01/01/20 2149 01/02/20 0026        Objective:   Vitals:   01/02/20 0800 01/02/20 0830 01/02/20 0900 01/02/20 1121  BP: 102/77 106/70 101/64 107/70  Pulse:    72  Resp: 16 15 14 18   Temp:    99.5 F (37.5 C)  TempSrc:    Oral  SpO2:    100%  Weight:    83.2 kg  Height:    6\' 2"  (1.88 m)    Wt Readings from Last 3 Encounters:  01/02/20 83.2 kg  12/09/19 81.6 kg     Intake/Output Summary (Last 24 hours)  at 01/02/2020 2013 Last data filed at 01/02/2020 1858 Gross per 24 hour  Intake 2100 ml  Output 150 ml  Net 1950 ml    Physical Exam  Gen:- Awake Alert, nonverbal HEENT:- [place].AT, No sclera icterus Neck-Supple Neck,No JVD,.  Lungs-  CTAB , fair symmetrical air movement CV- S1, S2 normal, regular  Abd-  +ve B.Sounds, Abd Soft, No tenderness, PEG tube in situ    Extremity/Skin:- No  edema, pedal pulses present  Psych-severe cognitive and speech deficits Neuro-chronic neuromuscular deficits from recent strokes/meningitis   Data Review:   Micro Results Recent Results (from the past 240 hour(s))  Respiratory Panel by RT PCR (Flu A&B, Covid) - Nasopharyngeal Swab     Status:  None   Collection Time: 01/01/20  4:30 PM   Specimen: Nasopharyngeal Swab  Result Value Ref Range Status   SARS Coronavirus 2 by RT PCR NEGATIVE NEGATIVE Final    Comment: (NOTE) SARS-CoV-2 target nucleic acids are NOT DETECTED.  The SARS-CoV-2 RNA is generally detectable in upper respiratoy specimens during the acute phase of infection. The lowest concentration of SARS-CoV-2 viral copies this assay can detect is 131 copies/mL. A negative result does not preclude SARS-Cov-2 infection and should not be used as the sole basis for treatment or other patient management decisions. A negative result may occur with  improper specimen collection/handling, submission of specimen other than nasopharyngeal swab, presence of viral mutation(s) within the areas targeted by this assay, and inadequate number of viral copies (<131 copies/mL). A negative result must be combined with clinical observations, patient history, and epidemiological information. The expected result is Negative.  Fact Sheet for Patients:  https://www.moore.com/  Fact Sheet for Healthcare Providers:  https://www.young.biz/  This test is no t yet approved or cleared by the [place] FDA and  has been  authorized for detection and/or diagnosis of SARS-CoV-2 by FDA under an Emergency Use Authorization (EUA). This EUA will remain  in effect (meaning this test can be used) for the duration of the COVID-19 declaration under Section 564(b)(1) of the Act, 21 U.S.C. section 360bbb-3(b)(1), unless the authorization is terminated or revoked sooner.     Influenza A by PCR NEGATIVE NEGATIVE Final   Influenza B by PCR NEGATIVE NEGATIVE Final    Comment: (NOTE) The Xpert Xpress SARS-CoV-2/FLU/RSV assay is intended as an aid in  the diagnosis of influenza from Nasopharyngeal swab specimens and  should not be used as a sole basis for treatment. Nasal washings and  aspirates are unacceptable for Xpert Xpress SARS-CoV-2/FLU/RSV  testing.  Fact Sheet for Patients: https://www.moore.com/  Fact Sheet for Healthcare Providers: https://www.young.biz/  This test is not yet approved or cleared by the [place] FDA and  has been authorized for detection and/or diagnosis of SARS-CoV-2 by  FDA under an Emergency Use Authorization (EUA). This EUA will remain  in effect (meaning this test can be used) for the duration of the  Covid-19 declaration under Section 564(b)(1) of the Act, 21  U.S.C. section 360bbb-3(b)(1), unless the authorization is  terminated or revoked. Performed at [facility], [address] [place], [place] [address]     Radiology Reports CT Head Wo Contrast  Result Date: 01/01/2020 CLINICAL DATA:  Altered mental status. EXAM: CT HEAD WITHOUT CONTRAST TECHNIQUE: Contiguous axial images were obtained from the base of the skull through the vertex without intravenous contrast. COMPARISON:  December 28, 2019 FINDINGS: Brain: There is mild cerebral atrophy with widening of the extra-axial spaces and ventricular dilatation. There are areas of decreased attenuation within the white matter tracts of the supratentorial brain, consistent with microvascular  disease changes. Vascular: No hyperdense vessel or unexpected calcification. Skull: Normal. Negative for fracture or focal lesion. Sinuses/Orbits: A 1.2 cm x 1.1 cm left maxillary sinus polyp versus mucous retention cyst is seen. Other: None. IMPRESSION: 1. Generalized cerebral atrophy. 2. No acute intracranial abnormality. 3. Left maxillary sinus polyp versus mucous retention cyst. Electronically Signed   By: Aram Candela M.D.   On: 01/01/2020 21:33   CT Angio Chest PE W/Cm &/Or Wo Cm  Result Date: 01/01/2020 CLINICAL DATA:  Abnormal chest x-ray and tachycardia EXAM: CT ANGIOGRAPHY CHEST WITH CONTRAST TECHNIQUE: Multidetector CT imaging of the chest was performed using the  standard protocol during bolus administration of intravenous contrast. Multiplanar CT image reconstructions and MIPs were obtained to evaluate the vascular anatomy. CONTRAST:  OMNIPAQUE IOHEXOL 350 MG/ML SOLN COMPARISON:  Radiograph same day FINDINGS: Cardiovascular: There is a optimal opacification of the pulmonary arteries. There is no central,segmental, or subsegmental filling defects within the pulmonary arteries. The heart is normal in size. No pericardial effusion or thickening. No evidence right heart strain. There is normal three-vessel brachiocephalic anatomy without proximal stenosis. The thoracic aorta is normal in appearance. Mediastinum/Nodes: No hilar, mediastinal, or axillary adenopathy. Thyroid gland, trachea, and esophagus demonstrate no significant findings. Lungs/Pleura: The lungs are clear. No pleural effusion or pneumothorax. No airspace consolidation. Upper Abdomen: No acute abnormalities present in the visualized portions of the upper abdomen. Percutaneous gastrotomy tube seen within the mid body of the stomach. Musculoskeletal: No chest wall abnormality. No acute or significant osseous findings. Review of the MIP images confirms the above findings. IMPRESSION: No central, segmental, or subsegmental  pulmonary embolism. No other acute intrathoracic pathology to explain the patient's symptoms. Electronically Signed   By: Jonna Clark M.D.   On: 01/01/2020 21:33   DG Chest Portable 1 View  Result Date: 01/01/2020 CLINICAL DATA:  Increased agitation for 4 days, history of brain injury EXAM: PORTABLE CHEST 1 VIEW COMPARISON:  Portable exam 1550 hours compared to 12/28/2019 and 09/28/2019 FINDINGS: Normal heart size, mediastinal contours, and pulmonary vascularity. New somewhat focal but ill-defined opacity in the RIGHT mid lung question pneumonia versus superimposed artifact. Remaining lungs clear. No pleural effusion or pneumothorax. Osseous structures unremarkable. IMPRESSION: New somewhat focal but ill-defined opacity in RIGHT mid lung question pneumonia; this was not seen on the prior study of 12/28/2019 nor on an earlier exam from 09/28/2019. Radiographic follow-up until resolution recommended to exclude pulmonary nodule. Electronically Signed   By: Ulyses Southward M.D.   On: 01/01/2020 16:05     CBC Recent Labs  Lab 01/01/20 1522 01/02/20 0145  WBC 7.0 8.5  HGB 11.2* 10.4*  HCT 34.7* 32.3*  PLT 280 273  MCV 91.3 93.4  MCH 29.5 30.1  MCHC 32.3 32.2  RDW 13.3 13.4  LYMPHSABS 2.5  --   MONOABS 0.7  --   EOSABS 0.2  --   BASOSABS 0.0  --     Chemistries  Recent Labs  Lab 01/01/20 1522 01/01/20 2238  NA 136  --   K 4.0  --   CL 98  --   CO2 26  --   GLUCOSE 83  --   BUN 7  --   CREATININE 0.60*  --   CALCIUM 9.3  --   MG  --  2.1  AST 14*  --   ALT 15  --   ALKPHOS 46  --   BILITOT 0.5  --    ------------------------------------------------------------------------------------------------------------------ No results for input(s): CHOL, HDL, LDLCALC, TRIG, CHOLHDL, LDLDIRECT in the last 72 hours.  No results found for: HGBA1C ------------------------------------------------------------------------------------------------------------------ No results for input(s): TSH,  T4TOTAL, T3FREE, THYROIDAB in the last 72 hours.  Invalid input(s): FREET3 ------------------------------------------------------------------------------------------------------------------ No results for input(s): VITAMINB12, FOLATE, FERRITIN, TIBC, IRON, RETICCTPCT in the last 72 hours.  Coagulation profile No results for input(s): INR, PROTIME in the last 168 hours.  No results for input(s): DDIMER in the last 72 hours.  Cardiac Enzymes No results for input(s): CKMB, TROPONINI, MYOGLOBIN in the last 168 hours.  Invalid input(s): CK ------------------------------------------------------------------------------------------------------------------ No results found for: BNP   Shon Hale M.D on 01/02/2020  at 8:13 PM  Go to [email or web address] - for contact info  [facility] - Office  [phone]

## 2020-01-03 DIAGNOSIS — N390 Urinary tract infection, site not specified: Secondary | ICD-10-CM

## 2020-01-03 DIAGNOSIS — I69319 Unspecified symptoms and signs involving cognitive functions following cerebral infarction: Secondary | ICD-10-CM

## 2020-01-03 DIAGNOSIS — Z978 Presence of other specified devices: Secondary | ICD-10-CM

## 2020-01-03 DIAGNOSIS — Z8661 Personal history of infections of the central nervous system: Secondary | ICD-10-CM

## 2020-01-03 DIAGNOSIS — R4689 Other symptoms and signs involving appearance and behavior: Secondary | ICD-10-CM

## 2020-01-03 MED ORDER — STERILE WATER FOR INJECTION IJ SOLN
INTRAMUSCULAR | Status: AC
  Filled 2020-01-03: qty 10

## 2020-01-03 MED ORDER — LORAZEPAM 2 MG/ML IJ SOLN
1.0000 mg | INTRAMUSCULAR | Status: DC | PRN
  Administered 2020-01-05 – 2020-01-06 (×2): 2 mg via INTRAVENOUS
  Filled 2020-01-03 (×4): qty 1

## 2020-01-03 MED ORDER — ZIPRASIDONE MESYLATE 20 MG IM SOLR
20.0000 mg | Freq: Once | INTRAMUSCULAR | Status: AC
  Administered 2020-01-03: 20 mg via INTRAMUSCULAR
  Filled 2020-01-03 (×2): qty 20

## 2020-01-03 MED ORDER — POLYVINYL ALCOHOL 1.4 % OP SOLN: 1.0000 [drp] | Freq: Four times a day (QID) | OPHTHALMIC | Status: DC | PRN

## 2020-01-03 MED ORDER — QUETIAPINE FUMARATE 25 MG PO TABS
50.0000 mg | ORAL_TABLET | Freq: Two times a day (BID) | ORAL | Status: DC
  Administered 2020-01-04 – 2020-01-06 (×5): 50 mg
  Filled 2020-01-03 (×7): qty 2

## 2020-01-03 NOTE — Progress Notes (Signed)
Patient Demographics:    Isaac [name], is a 39 y.o. male, DOB - 28-Aug-1980, NTZ:[id number]  Admit date - 01/01/2020   Admitting Physician Bobette Mo, MD  Outpatient Primary MD for the patient is Pcp, No  LOS - 2   Chief Complaint  Patient presents with  . Altered Mental Status        Subjective:     Isaac [name] appears calm and cooperative earlier in the morning during my visit.  He denies any significant pain or shortness of breath.  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 - -Also had history of NSTEMI due to demand ischemia during his meningitis hospitalization   -In July 2021 at [facility] where he underwent LP and diagnosed with pneumococcal meningitis, while at [facility] in July 2021 for pneumococcal meningitis patient was found to have 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 - He is easily angry, gets agitated and may kick and punch.   He does not talk much.  He is still incontinent.  He requires assistance with essential ADLs such as  bathing, dressing and using toilet.   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 pending further evaluation by speech pathologist and dietitian  4)Social/Ethics-patient's mother was a trained CNA is no longer able to manage him at home due to escalating behaviors --Patient remains total care- He is still taking food and medication via G-tube. - He is easily angry, gets agitated and may kick and punch.   He does not talk much.  He is still incontinent.  He requires assistance with essential ADLs such as bathing, dressing and using toilet -Family requesting possible placement to facility -Social work consult to help with placement requested  5) chronic anemia-multifactorial , partly nutritional, expect further drop in H&H with hydration/hemodilution  6) presumed UTI--patient does not meet sepsis criteria -  treat empirically with IV Rocephin pending culture data  Disposition/Need for in-Hospital Stay- patient unable to be discharged at this time due to --UTI requiring IV antibiotics and behavioral problems requiring medication adjustments, --At this time patient does not have a safe discharge plan, patient's mother unable to manage him at home, needs  SNF facility  Status is: Inpatient  Remains inpatient appropriate because:UTI requiring IV antibiotics and behavioral problems requiring medication adjustments,   Disposition: The patient is from: Home              Anticipated d/c is to: SNF              Anticipated d/c date is: 2 days              Patient currently is not medically stable to d/c. Barriers: Not Clinically Stable- -UTI requiring IV antibiotics and behavioral problems requiring medication adjustments, --At this time patient does not have a safe discharge plan, patient's mother unable to manage him at home, needs SNF facility  Code Status : Full   Family Communication: Discussed with patient's mother over the phone Consults  :  na  DVT Prophylaxis  :  Lovenox -   - SCDs   Lab Results  Component Value Date   PLT 273 01/02/2020    Inpatient Medications  Scheduled Meds: . ARIPiprazole  5 mg Oral BID  . enoxaparin (LOVENOX) injection  40 mg Subcutaneous Q24H  . lamoTRIgine  100 mg Oral Daily  . QUEtiapine  50 mg Per Tube BID  . sterile water (preservative free)       Continuous Infusions: . cefTRIAXone (ROCEPHIN)  IV    . dextrose 5 % and 0.9% NaCl 100 mL/hr at 01/02/20 0338   PRN Meds:.acetaminophen **OR** acetaminophen, haloperidol lactate, LORazepam, ondansetron **OR** ondansetron (ZOFRAN) IV, polyvinyl alcohol    Anti-infectives (From admission, onward)   Start     Dose/Rate Route Frequency Ordered Stop   01/02/20 2200  cefTRIAXone (ROCEPHIN) 1 g in sodium chloride 0.9 % 100 mL IVPB        1 g 200 mL/hr over 30 Minutes Intravenous Every 24 hours 01/01/20 2228     01/01/20 2200  cefTRIAXone (ROCEPHIN) 1 g in sodium chloride 0.9 % 100 mL IVPB        1 g 200 mL/hr over 30 Minutes Intravenous  Once 01/01/20 2149 01/02/20 0026        Objective:   Vitals:   01/02/20 0900 01/02/20 1121 01/02/20 2156 01/03/20 1355  BP: 101/64 107/70 132/74 123/71  Pulse:  72 80 79  Resp: Temp:  99.5 F  (37.5 C)  98.1 F (36.7 C)  TempSrc:  Oral  Oral  SpO2:  100% 100%   Weight:  83.2 kg    Height:   (1.88 m)      Wt Readings from Last 3 Encounters:  01/02/20 83.2 kg  12/09/19 81.6 kg    No intake or output data in the 24 hours ending 01/03/20 2000  Physical Exam  General exam: Alert, awake, no distress Respiratory system: Clear to auscultation. Respiratory effort normal. Cardiovascular system:RRR. No murmurs, rubs, gallops. Gastrointestinal system: Abdomen is nondistended, soft and nontender. No organomegaly or masses felt. Normal bowel sounds heard. PEG tube in place Central nervous system: no new deficits Extremities: No C/C/E, +pedal pulses Skin: No rashes, lesions or ulcers Psychiatry:calm and cooperative    Data Review:   Micro Results Recent Results (from the  past 240 hour(s))  Respiratory Panel by RT PCR (Flu A&B, Covid) - Nasopharyngeal Swab     Status: None   Collection Time: 01/01/20  4:30 PM   Specimen: Nasopharyngeal Swab  Result Value Ref Range Status   SARS Coronavirus 2 by RT PCR NEGATIVE NEGATIVE Final    Comment: (NOTE) SARS-CoV-2 target nucleic acids are NOT DETECTED.  The SARS-CoV-2 RNA is generally detectable in upper respiratoy specimens during the acute phase of infection. The lowest concentration of SARS-CoV-2 viral copies this assay can detect is 131 copies/mL. A negative result does not preclude SARS-Cov-2 infection and should not be used as the sole basis for treatment or other patient management decisions. A negative result may occur with  improper specimen collection/handling, submission of specimen other than nasopharyngeal swab, presence of viral mutation(s) within the areas targeted by this assay, and inadequate number of viral copies (<131 copies/mL). A negative result must be combined with clinical observations, patient history, and epidemiological information. The expected result is Negative.  Fact Sheet for Patients:   https://www.moore.com/https://www.fda.gov/media/142436/download  Fact Sheet for Healthcare Providers:  https://www.young.biz/https://www.fda.gov/media/142435/download  This test is no t yet approved or cleared by the [place] FDA and  has been authorized for detection and/or diagnosis of SARS-CoV-2 by FDA under an Emergency Use Authorization (EUA). This EUA will remain  in effect (meaning this test can be used) for the duration of the COVID-19 declaration under Section 564(b)(1) of the Act, 21 U.S.C. section 360bbb-3(b)(1), unless the authorization is terminated or revoked sooner.     Influenza A by PCR NEGATIVE NEGATIVE Final   Influenza B by PCR NEGATIVE NEGATIVE Final    Comment: (NOTE) The Xpert Xpress SARS-CoV-2/FLU/RSV assay is intended as an aid in  the diagnosis of influenza from Nasopharyngeal swab specimens and  should not be used as a sole basis for treatment. Nasal washings and  aspirates are unacceptable for Xpert Xpress SARS-CoV-2/FLU/RSV  testing.  Fact Sheet for Patients: https://www.moore.com/https://www.fda.gov/media/142436/download  Fact Sheet for Healthcare Providers: https://www.young.biz/https://www.fda.gov/media/142435/download  This test is not yet approved or cleared by the [place] FDA and  has been authorized for detection and/or diagnosis of SARS-CoV-2 by  FDA under an Emergency Use Authorization (EUA). This EUA will remain  in effect (meaning this test can be used) for the duration of the  Covid-19 declaration under Section 564(b)(1) of the Act, 21  U.S.C. section 360bbb-3(b)(1), unless the authorization is  terminated or revoked. Performed at [facility], [address] [place], [place] [address]     Radiology Reports CT Head Wo Contrast  Result Date: 01/01/2020 CLINICAL DATA:  Altered mental status. EXAM: CT HEAD WITHOUT CONTRAST TECHNIQUE: Contiguous axial images were obtained from the base of the skull through the vertex without intravenous contrast. COMPARISON:  December 28, 2019 FINDINGS: Brain: There is mild  cerebral atrophy with widening of the extra-axial spaces and ventricular dilatation. There are areas of decreased attenuation within the white matter tracts of the supratentorial brain, consistent with microvascular disease changes. Vascular: No hyperdense vessel or unexpected calcification. Skull: Normal. Negative for fracture or focal lesion. Sinuses/Orbits: A 1.2 cm x 1.1 cm left maxillary sinus polyp versus mucous retention cyst is seen. Other: None. IMPRESSION: 1. Generalized cerebral atrophy. 2. No acute intracranial abnormality. 3. Left maxillary sinus polyp versus mucous retention cyst. Electronically Signed   By: Aram Candelahaddeus  Houston M.D.   On: 01/01/2020 21:33   CT Angio Chest PE W/Cm &/Or Wo Cm  Result Date: 01/01/2020 CLINICAL DATA:  Abnormal chest  x-ray and tachycardia EXAM: CT ANGIOGRAPHY CHEST WITH CONTRAST TECHNIQUE: Multidetector CT imaging of the chest was performed using the standard protocol during bolus administration of intravenous contrast. Multiplanar CT image reconstructions and MIPs were obtained to evaluate the vascular anatomy. CONTRAST:  OMNIPAQUE IOHEXOL 350 MG/ML SOLN COMPARISON:  Radiograph same day FINDINGS: Cardiovascular: There is a optimal opacification of the pulmonary arteries. There is no central,segmental, or subsegmental filling defects within the pulmonary arteries. The heart is normal in size. No pericardial effusion or thickening. No evidence right heart strain. There is normal three-vessel brachiocephalic anatomy without proximal stenosis. The thoracic aorta is normal in appearance. Mediastinum/Nodes: No hilar, mediastinal, or axillary adenopathy. Thyroid gland, trachea, and esophagus demonstrate no significant findings. Lungs/Pleura: The lungs are clear. No pleural effusion or pneumothorax. No airspace consolidation. Upper Abdomen: No acute abnormalities present in the visualized portions of the upper abdomen. Percutaneous gastrotomy tube seen within the mid body  of the stomach. Musculoskeletal: No chest wall abnormality. No acute or significant osseous findings. Review of the MIP images confirms the above findings. IMPRESSION: No central, segmental, or subsegmental pulmonary embolism. No other acute intrathoracic pathology to explain the patient's symptoms. Electronically Signed   By: Jonna Clark M.D.   On: 01/01/2020 21:33   DG Chest Portable 1 View  Result Date: 01/01/2020 CLINICAL DATA:  Increased agitation for 4 days, history of brain injury EXAM: PORTABLE CHEST 1 VIEW COMPARISON:  Portable exam 1550 hours compared to 12/28/2019 and 09/28/2019 FINDINGS: Normal heart size, mediastinal contours, and pulmonary vascularity. New somewhat focal but ill-defined opacity in the RIGHT mid lung question pneumonia versus superimposed artifact. Remaining lungs clear. No pleural effusion or pneumothorax. Osseous structures unremarkable. IMPRESSION: New somewhat focal but ill-defined opacity in RIGHT mid lung question pneumonia; this was not seen on the prior study of 12/28/2019 nor on an earlier exam from 09/28/2019. Radiographic follow-up until resolution recommended to exclude pulmonary nodule. Electronically Signed   By: Ulyses Southward M.D.   On: 01/01/2020 16:05     CBC Recent Labs  Lab 01/01/20 1522 01/02/20 0145  WBC 7.0 8.5  HGB 11.2* 10.4*  HCT 34.7* 32.3*  PLT 280 273  MCV 91.3 93.4  MCH 29.5 30.1  MCHC 32.3 32.2  RDW 13.3 13.4  LYMPHSABS 2.5  --   MONOABS 0.7  --   EOSABS 0.2  --   BASOSABS 0.0  --     Chemistries  Recent Labs  Lab 01/01/20 1522 01/01/20 2238  NA 136  --   K 4.0  --   CL 98  --   CO2 26  --   GLUCOSE 83  --   BUN 7  --   CREATININE 0.60*  --   CALCIUM 9.3  --   MG  --  2.1  AST 14*  --   ALT 15  --   ALKPHOS 46  --   BILITOT 0.5  --    ------------------------------------------------------------------------------------------------------------------ No results for input(s): CHOL, HDL, LDLCALC, TRIG, CHOLHDL,  LDLDIRECT in the last 72 hours.  No results found for: HGBA1C ------------------------------------------------------------------------------------------------------------------ No results for input(s): TSH, T4TOTAL, T3FREE, THYROIDAB in the last 72 hours.  Invalid input(s): FREET3 ------------------------------------------------------------------------------------------------------------------ No results for input(s): VITAMINB12, FOLATE, FERRITIN, TIBC, IRON, RETICCTPCT in the last 72 hours.  Coagulation profile No results for input(s): INR, PROTIME in the last 168 hours.  No results for input(s): DDIMER in the last 72 hours.  Cardiac Enzymes No results for input(s): CKMB, TROPONINI, MYOGLOBIN in the  last 168 hours.  Invalid input(s): CK ------------------------------------------------------------------------------------------------------------------ No results found for: BNP   Erick Blinks M.D on 01/03/2020 at 8:00 PM  Go to [email or web address] - for contact info  [facility] - Office  [phone]

## 2020-01-04 ENCOUNTER — Other Ambulatory Visit: Payer: Self-pay

## 2020-01-04 DIAGNOSIS — D649 Anemia, unspecified: Secondary | ICD-10-CM

## 2020-01-04 NOTE — Progress Notes (Signed)
Patient extremely agitated and verbally aggressive with several attempts to be physical with staff. MD notified and received order for ziprasidone (Geodon) injection 20 mg intramuscular once.   Geodon has been administered to patient per order.

## 2020-01-04 NOTE — Progress Notes (Signed)
Patient Demographics:    Isaac [name], is a 39 y.o. male, DOB - April 02, 1980, ZOX:[id number]  Admit date - 01/01/2020   Admitting Physician Bobette Mo, MD  Outpatient Primary MD for the patient is Pcp, No  LOS - 3   Chief Complaint  Patient presents with  . Altered Mental Status        Subjective:     Isaac [name] currently appears calm.  Restraints were removed yesterday afternoon and were not replaced.  No significant events overnight.  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 - -Also had history of NSTEMI due to demand ischemia during his meningitis hospitalization   -In July 2021 at [facility] where he underwent LP and diagnosed with pneumococcal meningitis, while at [facility] in July 2021 for pneumococcal meningitis patient was found to have 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 - He is easily angry, gets agitated and may kick and punch.   He does not talk much.  He is still incontinent.  He requires assistance with essential ADLs such as bathing, dressing and using toilet.   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 pending further evaluation by speech pathologist and dietitian  4)Social/Ethics-patient's mother was a trained CNA is no longer able to manage him at home due to escalating behaviors --Patient remains total care- He is still taking food and medication via G-tube. - He is easily angry, gets agitated and may kick and punch.   He does not talk much.  He is still incontinent.  He requires assistance with essential ADLs such as bathing, dressing and using toilet -Family requesting possible placement to facility -Social work consult to help with placement requested  5) chronic anemia-multifactorial , partly nutritional, expect further drop in H&H with hydration/hemodilution  6) presumed UTI--patient does not meet sepsis criteria -treat empirically with IV Rocephin pending culture data  Disposition/Need for in-Hospital Stay- patient unable to be discharged at this time due to --UTI requiring IV antibiotics and  behavioral problems requiring medication adjustments, --At this time patient does not have a safe discharge plan, patient's mother unable to manage him at home, needs SNF facility  Status is: Inpatient  Remains inpatient appropriate because:UTI requiring IV antibiotics and behavioral problems requiring medication  adjustments,   Disposition: The patient is from: Home              Anticipated d/c is to: SNF              Anticipated d/c date is: 2 days              Patient currently is not medically stable to d/c. Barriers: Not Clinically Stable- -UTI requiring IV antibiotics and behavioral problems requiring medication adjustments, --At this time patient does not have a safe discharge plan, patient's mother unable to manage him at home, needs SNF facility  Code Status : Full   Family Communication: Discussed with patient's mother over the phone Consults  :  na  DVT Prophylaxis  :  Lovenox -   - SCDs   Lab Results  Component Value Date   PLT 273 01/02/2020    Inpatient Medications  Scheduled Meds: . ARIPiprazole  5 mg Oral BID  . enoxaparin (LOVENOX) injection  40 mg Subcutaneous Q24H  . lamoTRIgine  100 mg Oral Daily  . QUEtiapine  50 mg Per Tube BID   Continuous Infusions: . cefTRIAXone (ROCEPHIN)  IV 1 g (01/04/20 0034)  . dextrose 5 % and 0.9% NaCl 100 mL/hr at 01/02/20 0338   PRN Meds:.acetaminophen **OR** acetaminophen, haloperidol lactate, LORazepam, ondansetron **OR** ondansetron (ZOFRAN) IV, polyvinyl alcohol    Anti-infectives (From admission, onward)   Start     Dose/Rate Route Frequency Ordered Stop   01/02/20 2200  cefTRIAXone (ROCEPHIN) 1 g in sodium chloride 0.9 % 100 mL IVPB        1 g 200 mL/hr over 30 Minutes Intravenous Every 24 hours 01/01/20 2228     01/01/20 2200  cefTRIAXone (ROCEPHIN) 1 g in sodium chloride 0.9 % 100 mL IVPB        1 g 200 mL/hr over 30 Minutes Intravenous  Once 01/01/20 2149 01/02/20 0026        Objective:   Vitals:   01/03/20 1355 01/03/20 2200 01/04/20 0600 01/04/20 1419  BP: 123/71 116/79 121/74 117/75  Pulse: 79   73  Resp: 16 17 16 17   Temp: 98.1 F (36.7 C) 98.1 F (36.7 C)  98.5 F (36.9 C)  TempSrc: Oral Axillary  Oral  SpO2:  100% 100% 98%  Weight:      Height:        Wt Readings from Last 3 Encounters:   01/02/20 83.2 kg  12/09/19 81.6 kg     Intake/Output Summary (Last 24 hours) at 01/04/2020 1800 Last data filed at 01/04/2020 1500 Gross per 24 hour  Intake 100 ml  Output 800 ml  Net -700 ml    Physical Exam  General exam: Alert, awake, no distress Respiratory system: Clear to auscultation. Respiratory effort normal. Cardiovascular system:RRR. No murmurs, rubs, gallops. Gastrointestinal system: Abdomen is nondistended, soft and nontender. No organomegaly or masses felt. Normal bowel sounds heard.  PEG tube in place Central nervous system: No new neurologic deficits Extremities: No C/C/E, +pedal pulses Skin: No rashes, lesions or ulcers Psychiatry: Mostly calm, but easily gets agitated     Data Review:   Micro Results Recent Results (from the past 240 hour(s))  Respiratory Panel by RT PCR (  Flu A&B, Covid) - Nasopharyngeal Swab     Status: None   Collection Time: 01/01/20  4:30 PM   Specimen: Nasopharyngeal Swab  Result Value Ref Range Status   SARS Coronavirus 2 by RT PCR NEGATIVE NEGATIVE Final    Comment: (NOTE) SARS-CoV-2 target nucleic acids are NOT DETECTED.  The SARS-CoV-2 RNA is generally detectable in upper respiratoy specimens during the acute phase of infection. The lowest concentration of SARS-CoV-2 viral copies this assay can detect is 131 copies/mL. A negative result does not preclude SARS-Cov-2 infection and should not be used as the sole basis for treatment or other patient management decisions. A negative result may occur with  improper specimen collection/handling, submission of specimen other than nasopharyngeal swab, presence of viral mutation(s) within the areas targeted by this assay, and inadequate number of viral copies (<131 copies/mL). A negative result must be combined with clinical observations, patient history, and epidemiological information. The expected result is Negative.  Fact Sheet for Patients:   https://www.moore.com/  Fact Sheet for Healthcare Providers:  https://www.young.biz/  This test is no t yet approved or cleared by the [place] FDA and  has been authorized for detection and/or diagnosis of SARS-CoV-2 by FDA under an Emergency Use Authorization (EUA). This EUA will remain  in effect (meaning this test can be used) for the duration of the COVID-19 declaration under Section 564(b)(1) of the Act, 21 U.S.C. section 360bbb-3(b)(1), unless the authorization is terminated or revoked sooner.     Influenza A by PCR NEGATIVE NEGATIVE Final   Influenza B by PCR NEGATIVE NEGATIVE Final    Comment: (NOTE) The Xpert Xpress SARS-CoV-2/FLU/RSV assay is intended as an aid in  the diagnosis of influenza from Nasopharyngeal swab specimens and  should not be used as a sole basis for treatment. Nasal washings and  aspirates are unacceptable for Xpert Xpress SARS-CoV-2/FLU/RSV  testing.  Fact Sheet for Patients: https://www.moore.com/  Fact Sheet for Healthcare Providers: https://www.young.biz/  This test is not yet approved or cleared by the [place] FDA and  has been authorized for detection and/or diagnosis of SARS-CoV-2 by  FDA under an Emergency Use Authorization (EUA). This EUA will remain  in effect (meaning this test can be used) for the duration of the  Covid-19 declaration under Section 564(b)(1) of the Act, 21  U.S.C. section 360bbb-3(b)(1), unless the authorization is  terminated or revoked. Performed at [facility], [address], [place], [place] [address]     Radiology Reports CT Head Wo Contrast  Result Date: 01/01/2020 CLINICAL DATA:  Altered mental status. EXAM: CT HEAD WITHOUT CONTRAST TECHNIQUE: Contiguous axial images were obtained from the base of the skull through the vertex without intravenous contrast. COMPARISON:  December 28, 2019 FINDINGS: Brain: There is mild  cerebral atrophy with widening of the extra-axial spaces and ventricular dilatation. There are areas of decreased attenuation within the white matter tracts of the supratentorial brain, consistent with microvascular disease changes. Vascular: No hyperdense vessel or unexpected calcification. Skull: Normal. Negative for fracture or focal lesion. Sinuses/Orbits: A 1.2 cm x 1.1 cm left maxillary sinus polyp versus mucous retention cyst is seen. Other: None. IMPRESSION: 1. Generalized cerebral atrophy. 2. No acute intracranial abnormality. 3. Left maxillary sinus polyp versus mucous retention cyst. Electronically Signed   By: Aram Candela M.D.   On: 01/01/2020 21:33   CT Angio Chest PE W/Cm &/Or Wo Cm  Result Date: 01/01/2020 CLINICAL DATA:  Abnormal chest x-ray and tachycardia EXAM: CT ANGIOGRAPHY CHEST WITH CONTRAST  TECHNIQUE: Multidetector CT imaging of the chest was performed using the standard protocol during bolus administration of intravenous contrast. Multiplanar CT image reconstructions and MIPs were obtained to evaluate the vascular anatomy. CONTRAST:  OMNIPAQUE IOHEXOL 350 MG/ML SOLN COMPARISON:  Radiograph same day FINDINGS: Cardiovascular: There is a optimal opacification of the pulmonary arteries. There is no central,segmental, or subsegmental filling defects within the pulmonary arteries. The heart is normal in size. No pericardial effusion or thickening. No evidence right heart strain. There is normal three-vessel brachiocephalic anatomy without proximal stenosis. The thoracic aorta is normal in appearance. Mediastinum/Nodes: No hilar, mediastinal, or axillary adenopathy. Thyroid gland, trachea, and esophagus demonstrate no significant findings. Lungs/Pleura: The lungs are clear. No pleural effusion or pneumothorax. No airspace consolidation. Upper Abdomen: No acute abnormalities present in the visualized portions of the upper abdomen. Percutaneous gastrotomy tube seen within the mid body  of the stomach. Musculoskeletal: No chest wall abnormality. No acute or significant osseous findings. Review of the MIP images confirms the above findings. IMPRESSION: No central, segmental, or subsegmental pulmonary embolism. No other acute intrathoracic pathology to explain the patient's symptoms. Electronically Signed   By: Jonna Clark M.D.   On: 01/01/2020 21:33   DG Chest Portable 1 View  Result Date: 01/01/2020 CLINICAL DATA:  Increased agitation for 4 days, history of brain injury EXAM: PORTABLE CHEST 1 VIEW COMPARISON:  Portable exam 1550 hours compared to 12/28/2019 and 09/28/2019 FINDINGS: Normal heart size, mediastinal contours, and pulmonary vascularity. New somewhat focal but ill-defined opacity in the RIGHT mid lung question pneumonia versus superimposed artifact. Remaining lungs clear. No pleural effusion or pneumothorax. Osseous structures unremarkable. IMPRESSION: New somewhat focal but ill-defined opacity in RIGHT mid lung question pneumonia; this was not seen on the prior study of 12/28/2019 nor on an earlier exam from 09/28/2019. Radiographic follow-up until resolution recommended to exclude pulmonary nodule. Electronically Signed   By: Ulyses Southward M.D.   On: 01/01/2020 16:05     CBC Recent Labs  Lab 01/01/20 1522 01/02/20 0145  WBC 7.0 8.5  HGB 11.2* 10.4*  HCT 34.7* 32.3*  PLT 280 273  MCV 91.3 93.4  MCH 29.5 30.1  MCHC 32.3 32.2  RDW 13.3 13.4  LYMPHSABS 2.5  --   MONOABS 0.7  --   EOSABS 0.2  --   BASOSABS 0.0  --     Chemistries  Recent Labs  Lab 01/01/20 1522 01/01/20 2238  NA 136  --   K 4.0  --   CL 98  --   CO2 26  --   GLUCOSE 83  --   BUN 7  --   CREATININE 0.60*  --   CALCIUM 9.3  --   MG  --  2.1  AST 14*  --   ALT 15  --   ALKPHOS 46  --   BILITOT 0.5  --    ------------------------------------------------------------------------------------------------------------------ No results for input(s): CHOL, HDL, LDLCALC, TRIG, CHOLHDL,  LDLDIRECT in the last 72 hours.  No results found for: HGBA1C ------------------------------------------------------------------------------------------------------------------ No results for input(s): TSH, T4TOTAL, T3FREE, THYROIDAB in the last 72 hours.  Invalid input(s): FREET3 ------------------------------------------------------------------------------------------------------------------ No results for input(s): VITAMINB12, FOLATE, FERRITIN, TIBC, IRON, RETICCTPCT in the last 72 hours.  Coagulation profile No results for input(s): INR, PROTIME in the last 168 hours.  No results for input(s): DDIMER in the last 72 hours.  Cardiac Enzymes No results for input(s): CKMB, TROPONINI, MYOGLOBIN in the last 168 hours.  Invalid input(s): CK ------------------------------------------------------------------------------------------------------------------ No  results found for: BNP   Erick BlinksJehanzeb Permelia Bamba M.D on 01/04/2020 at 6:00 PM  Go to [email or web address] - for contact info  [facility] - Office  [phone]

## 2020-01-05 DIAGNOSIS — W06XXXA Fall from bed, initial encounter: Secondary | ICD-10-CM | POA: Diagnosis not present

## 2020-01-05 DIAGNOSIS — M25522 Pain in left elbow: Secondary | ICD-10-CM | POA: Diagnosis not present

## 2020-01-05 DIAGNOSIS — D509 Iron deficiency anemia, unspecified: Secondary | ICD-10-CM | POA: Diagnosis present

## 2020-01-05 DIAGNOSIS — N3001 Acute cystitis with hematuria: Secondary | ICD-10-CM | POA: Diagnosis not present

## 2020-01-05 DIAGNOSIS — F919 Conduct disorder, unspecified: Secondary | ICD-10-CM | POA: Diagnosis present

## 2020-01-05 DIAGNOSIS — R1310 Dysphagia, unspecified: Secondary | ICD-10-CM | POA: Diagnosis present

## 2020-01-05 DIAGNOSIS — Z8661 Personal history of infections of the central nervous system: Secondary | ICD-10-CM | POA: Diagnosis not present

## 2020-01-05 DIAGNOSIS — I10 Essential (primary) hypertension: Secondary | ICD-10-CM | POA: Diagnosis present

## 2020-01-05 DIAGNOSIS — I69319 Unspecified symptoms and signs involving cognitive functions following cerebral infarction: Secondary | ICD-10-CM | POA: Diagnosis not present

## 2020-01-05 DIAGNOSIS — Z781 Physical restraint status: Secondary | ICD-10-CM | POA: Diagnosis not present

## 2020-01-05 DIAGNOSIS — N390 Urinary tract infection, site not specified: Secondary | ICD-10-CM | POA: Diagnosis present

## 2020-01-05 DIAGNOSIS — G9341 Metabolic encephalopathy: Secondary | ICD-10-CM | POA: Diagnosis present

## 2020-01-05 DIAGNOSIS — R9431 Abnormal electrocardiogram [ECG] [EKG]: Secondary | ICD-10-CM | POA: Diagnosis not present

## 2020-01-05 DIAGNOSIS — R627 Adult failure to thrive: Secondary | ICD-10-CM | POA: Diagnosis present

## 2020-01-05 DIAGNOSIS — I252 Old myocardial infarction: Secondary | ICD-10-CM | POA: Diagnosis not present

## 2020-01-05 DIAGNOSIS — R4689 Other symptoms and signs involving appearance and behavior: Secondary | ICD-10-CM | POA: Diagnosis not present

## 2020-01-05 DIAGNOSIS — N179 Acute kidney failure, unspecified: Secondary | ICD-10-CM | POA: Diagnosis present

## 2020-01-05 DIAGNOSIS — R296 Repeated falls: Secondary | ICD-10-CM | POA: Diagnosis present

## 2020-01-05 DIAGNOSIS — Z6823 Body mass index (BMI) 23.0-23.9, adult: Secondary | ICD-10-CM | POA: Diagnosis not present

## 2020-01-05 DIAGNOSIS — R451 Restlessness and agitation: Secondary | ICD-10-CM | POA: Diagnosis present

## 2020-01-05 DIAGNOSIS — Z20822 Contact with and (suspected) exposure to covid-19: Secondary | ICD-10-CM | POA: Diagnosis present

## 2020-01-05 DIAGNOSIS — Z931 Gastrostomy status: Secondary | ICD-10-CM | POA: Diagnosis not present

## 2020-01-05 DIAGNOSIS — Z79899 Other long term (current) drug therapy: Secondary | ICD-10-CM | POA: Diagnosis not present

## 2020-01-05 DIAGNOSIS — I471 Supraventricular tachycardia: Secondary | ICD-10-CM | POA: Diagnosis not present

## 2020-01-05 DIAGNOSIS — G931 Anoxic brain damage, not elsewhere classified: Secondary | ICD-10-CM | POA: Diagnosis present

## 2020-01-05 LAB — URINE CULTURE: Culture: NO GROWTH

## 2020-01-05 NOTE — Progress Notes (Signed)
Patient Demographics:    Isaac [name], is a 39 y.o. male, DOB - 04/23/1980, WUJ:[id number]  Admit date - 01/01/2020   Admitting Physician Bobette Moavid Manuel Ortiz, MD  Outpatient Primary MD for the patient is Pcp, No  LOS - 4   Chief Complaint  Patient presents with  . Altered Mental Status        Subjective:     Isaac HeckSamuel Flenner had no acute events overnight. 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 - He is easily angry, gets agitated and may kick and punch.   He does not talk much.  He is still incontinent.  He requires assistance with essential ADLs such as bathing, dressing and using toilet.   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 pending further evaluation by speech pathologist and dietitian  4)Social/Ethics-patient's mother was a trained CNA is no longer able to manage him at home due to escalating behaviors --Patient remains total care- He is still taking food and medication via G-tube. - He is easily angry, gets agitated and may kick and punch.   He does not talk much.  He is still incontinent.  He requires assistance with essential ADLs such as bathing, dressing and using toilet -Family requesting possible placement to facility -Social work consult to help with placement requested  5) chronic anemia-multifactorial , partly nutritional, expect further drop in H&H with hydration/hemodilution  6) presumed UTI--patient does not meet sepsis criteria -treat empirically with IV Rocephin pending culture data  Disposition/Need for in-Hospital Stay- patient unable to be discharged at this time due to --UTI requiring IV antibiotics and behavioral problems requiring medication adjustments, --At this time  patient does not have a safe discharge plan, patient's mother unable to manage him at home, needs SNF facility  Status is: Inpatient  Remains inpatient appropriate because:UTI requiring IV antibiotics and behavioral problems requiring medication adjustments,   Disposition: The patient is from: Home               Anticipated d/c is to: SNF              Anticipated d/c date is: 2 days              Patient currently is medically stable to d/c. Barriers: Not Clinically Stable- -UTI requiring IV antibiotics and behavioral problems requiring medication adjustments, --At this time patient does not have a safe discharge plan, patient's mother unable to manage him at home, needs SNF facility  Code Status : Full   Family Communication: Discussed with patient's mother over the phone Consults  :  na  DVT Prophylaxis  :  Lovenox -   - SCDs   Lab Results  Component Value Date   PLT 273 01/02/2020    Inpatient Medications  Scheduled Meds: . ARIPiprazole  5 mg Oral BID  . enoxaparin (LOVENOX) injection  40 mg Subcutaneous Q24H  . lamoTRIgine  100 mg Oral Daily  . QUEtiapine  50 mg Per Tube BID   Continuous Infusions: . cefTRIAXone (ROCEPHIN)  IV 1 g (01/04/20 2017)  . dextrose 5 % and 0.9% NaCl 100 mL/hr at 01/02/20 0338   PRN Meds:.acetaminophen **OR** acetaminophen, haloperidol lactate, LORazepam, ondansetron **OR** ondansetron (ZOFRAN) IV, polyvinyl alcohol    Anti-infectives (From admission, onward)   Start     Dose/Rate Route Frequency Ordered Stop   01/02/20 2200  cefTRIAXone (ROCEPHIN) 1 g in sodium chloride 0.9 % 100 mL IVPB        1 g 200 mL/hr over 30 Minutes Intravenous Every 24 hours 01/01/20 2228     01/01/20 2200  cefTRIAXone (ROCEPHIN) 1 g in sodium chloride 0.9 % 100 mL IVPB        1 g 200 mL/hr over 30 Minutes Intravenous  Once 01/01/20 2149 01/02/20 0026        Objective:   Vitals:   01/03/20 1355 01/03/20 2200 01/04/20 0600 01/04/20 1419  BP: 123/71 116/79 121/74 117/75  Pulse: 79   73  Resp: 16 17 16 17   Temp: 98.1 F (36.7 C) 98.1 F (36.7 C)  98.5 F (36.9 C)  TempSrc: Oral Axillary  Oral  SpO2:  100% 100% 98%  Weight:      Height:        Wt Readings from Last 3 Encounters:  01/02/20 83.2 kg  12/09/19 81.6 kg     Intake/Output Summary (Last 24  hours) at 01/05/2020 1916 Last data filed at 01/04/2020 1939 Gross per 24 hour  Intake --  Output 250 ml  Net -250 ml    Physical Exam  General exam: Alert, awake, no distress Respiratory system: Clear to auscultation. Respiratory effort normal. Cardiovascular system:RRR. No murmurs, rubs, gallops. Gastrointestinal system: Abdomen is nondistended, soft and nontender. No organomegaly or masses felt. Normal bowel sounds heard. Central nervous system: No new neurologic deficits Extremities: No C/C/E, +pedal pulses Skin: No rashes, lesions or ulcers Psychiatry: Calm, cooperative      Data Review:   Micro Results Recent Results (from the past 240 hour(s))  Respiratory Panel by RT PCR (Flu A&B, Covid) - Nasopharyngeal Swab     Status: None   Collection Time: 01/01/20  4:30 PM   Specimen: Nasopharyngeal Swab  Result Value Ref Range Status   SARS Coronavirus 2 by RT PCR NEGATIVE NEGATIVE Final    Comment: (NOTE) SARS-CoV-2 target nucleic acids are NOT DETECTED.  The SARS-CoV-2 RNA is generally detectable in upper respiratoy specimens during the acute phase of infection. The lowest concentration of SARS-CoV-2 viral copies this assay can detect is 131 copies/mL. A negative result does not preclude SARS-Cov-2 infection and should not be used as the sole basis for treatment or other patient management decisions. A negative result may occur with  improper specimen collection/handling, submission of specimen other than nasopharyngeal swab, presence of viral mutation(s) within the areas targeted by this assay, and inadequate number of viral copies (<131 copies/mL). A negative result must be combined with clinical observations, patient history, and epidemiological information. The expected result is Negative.  Fact Sheet for Patients:  https://www.moore.com/  Fact Sheet for Healthcare Providers:  https://www.young.biz/  This test is no t yet  approved or cleared by the [place] FDA and  has been authorized for detection and/or diagnosis of SARS-CoV-2 by FDA under an Emergency Use Authorization (EUA). This EUA will remain  in effect (meaning this test can be used) for the duration of the COVID-19 declaration under Section 564(b)(1) of the Act, 21 U.S.C. section 360bbb-3(b)(1), unless the authorization is terminated or revoked sooner.     Influenza A by PCR NEGATIVE NEGATIVE Final   Influenza B by PCR NEGATIVE NEGATIVE Final    Comment: (NOTE) The Xpert Xpress SARS-CoV-2/FLU/RSV assay is intended as an aid in  the diagnosis of influenza from Nasopharyngeal swab specimens and  should not be used as a sole basis for treatment. Nasal washings and  aspirates are unacceptable for Xpert Xpress SARS-CoV-2/FLU/RSV  testing.  Fact Sheet for Patients: https://www.moore.com/  Fact Sheet for Healthcare Providers: https://www.young.biz/  This test is not yet approved or cleared by the [place] FDA and  has been authorized for detection and/or diagnosis of SARS-CoV-2 by  FDA under an Emergency Use Authorization (EUA). This EUA will remain  in effect (meaning this test can be used) for the duration of the  Covid-19 declaration under Section 564(b)(1) of the Act, 21  U.S.C. section 360bbb-3(b)(1), unless the authorization is  terminated or revoked. Performed at [facility], [address] [place], [place] [address]     Radiology Reports CT Head Wo Contrast  Result Date: 01/01/2020 CLINICAL DATA:  Altered mental status. EXAM: CT HEAD WITHOUT CONTRAST TECHNIQUE: Contiguous axial images were obtained from the base of the skull through the vertex without intravenous contrast. COMPARISON:  December 28, 2019 FINDINGS: Brain: There is mild cerebral atrophy with widening of the extra-axial spaces and ventricular dilatation. There are areas of decreased attenuation within the white matter tracts  of the supratentorial brain, consistent with microvascular disease changes. Vascular: No hyperdense vessel or unexpected calcification. Skull: Normal. Negative for fracture or focal lesion. Sinuses/Orbits: A 1.2 cm x 1.1 cm left maxillary sinus polyp versus mucous retention cyst is seen. Other: None. IMPRESSION: 1. Generalized cerebral atrophy. 2. No acute intracranial abnormality. 3. Left maxillary sinus polyp versus mucous retention cyst. Electronically Signed   By: Aram Candela M.D.   On: 01/01/2020 21:33   CT Angio Chest PE W/Cm &/Or Wo Cm  Result Date: 01/01/2020 CLINICAL DATA:  Abnormal chest x-ray and tachycardia EXAM: CT ANGIOGRAPHY CHEST WITH CONTRAST TECHNIQUE: Multidetector CT imaging of the chest was performed using the standard protocol during bolus administration of intravenous  contrast. Multiplanar CT image reconstructions and MIPs were obtained to evaluate the vascular anatomy. CONTRAST:  OMNIPAQUE IOHEXOL 350 MG/ML SOLN COMPARISON:  Radiograph same day FINDINGS: Cardiovascular: There is a optimal opacification of the pulmonary arteries. There is no central,segmental, or subsegmental filling defects within the pulmonary arteries. The heart is normal in size. No pericardial effusion or thickening. No evidence right heart strain. There is normal three-vessel brachiocephalic anatomy without proximal stenosis. The thoracic aorta is normal in appearance. Mediastinum/Nodes: No hilar, mediastinal, or axillary adenopathy. Thyroid gland, trachea, and esophagus demonstrate no significant findings. Lungs/Pleura: The lungs are clear. No pleural effusion or pneumothorax. No airspace consolidation. Upper Abdomen: No acute abnormalities present in the visualized portions of the upper abdomen. Percutaneous gastrotomy tube seen within the mid body of the stomach. Musculoskeletal: No chest wall abnormality. No acute or significant osseous findings. Review of the MIP images confirms the above findings.  IMPRESSION: No central, segmental, or subsegmental pulmonary embolism. No other acute intrathoracic pathology to explain the patient's symptoms. Electronically Signed   By: Jonna Clark M.D.   On: 01/01/2020 21:33   DG Chest Portable 1 View  Result Date: 01/01/2020 CLINICAL DATA:  Increased agitation for 4 days, history of brain injury EXAM: PORTABLE CHEST 1 VIEW COMPARISON:  Portable exam 1550 hours compared to 12/28/2019 and 09/28/2019 FINDINGS: Normal heart size, mediastinal contours, and pulmonary vascularity. New somewhat focal but ill-defined opacity in the RIGHT mid lung question pneumonia versus superimposed artifact. Remaining lungs clear. No pleural effusion or pneumothorax. Osseous structures unremarkable. IMPRESSION: New somewhat focal but ill-defined opacity in RIGHT mid lung question pneumonia; this was not seen on the prior study of 12/28/2019 nor on an earlier exam from 09/28/2019. Radiographic follow-up until resolution recommended to exclude pulmonary nodule. Electronically Signed   By: Ulyses Southward M.D.   On: 01/01/2020 16:05     CBC Recent Labs  Lab 01/01/20 1522 01/02/20 0145  WBC 7.0 8.5  HGB 11.2* 10.4*  HCT 34.7* 32.3*  PLT 280 273  MCV 91.3 93.4  MCH 29.5 30.1  MCHC 32.3 32.2  RDW 13.3 13.4  LYMPHSABS 2.5  --   MONOABS 0.7  --   EOSABS 0.2  --   BASOSABS 0.0  --     Chemistries  Recent Labs  Lab 01/01/20 1522 01/01/20 2238  NA 136  --   K 4.0  --   CL 98  --   CO2 26  --   GLUCOSE 83  --   BUN 7  --   CREATININE 0.60*  --   CALCIUM 9.3  --   MG  --  2.1  AST 14*  --   ALT 15  --   ALKPHOS 46  --   BILITOT 0.5  --    ------------------------------------------------------------------------------------------------------------------ No results for input(s): CHOL, HDL, LDLCALC, TRIG, CHOLHDL, LDLDIRECT in the last 72 hours.  No results found for:  HGBA1C ------------------------------------------------------------------------------------------------------------------ No results for input(s): TSH, T4TOTAL, T3FREE, THYROIDAB in the last 72 hours.  Invalid input(s): FREET3 ------------------------------------------------------------------------------------------------------------------ No results for input(s): VITAMINB12, FOLATE, FERRITIN, TIBC, IRON, RETICCTPCT in the last 72 hours.  Coagulation profile No results for input(s): INR, PROTIME in the last 168 hours.  No results for input(s): DDIMER in the last 72 hours.  Cardiac Enzymes No results for input(s): CKMB, TROPONINI, MYOGLOBIN in the last 168 hours.  Invalid input(s): CK ------------------------------------------------------------------------------------------------------------------ No results found for: BNP   Erick Blinks M.D on 01/05/2020 at 7:16 PM  Go to [email or web address] -  for contact info  [facility] - Office  [phone]

## 2020-01-05 NOTE — TOC Progression Note (Signed)
Transition of Care [facility]) - Progression Note    Patient Details  Name: Isaac [name] MRN: [id number] Date of Birth: [birth year]  Transition of Care [facility]) CM/SW Contact  Karn Cassis, [place] Phone Number: 01/05/2020, 2:30 PM  Clinical Narrative:  LCSW discussed pt with supervisor. LCSW followed up with below facilities regarding referral. - [facility]: Reviewing referral and will return call. - [facility]: Declined - [facility]: Left voicemail for Velna Hatchet. - [facility]: Debbie following up with corporate. - [facility]: Left voicemail for admissions. - [facility]: Declined - [facility]: Declined - [facility]: Declined - [facility]: Declined - [facility]: Declined  TOC will continue to follow and assist with d/c planning.         Barriers to Discharge: Inadequate or no insurance, SNF Pending payor source - LOG, SNF Pending Medicaid, SNF Pending bed offer  Expected Discharge Plan and Services                                                 Social Determinants of Health (SDOH) Interventions    Readmission Risk Interventions No flowsheet data found.

## 2020-01-05 NOTE — Progress Notes (Signed)
Peri care and Mady Haagensen has been performed on patient and all linens changed. Patient now resting comfortably at this time.

## 2020-01-05 NOTE — Plan of Care (Signed)
  Problem: Education: Goal: Knowledge of General Education information will improve Description Including pain rating scale, medication(s)/side effects and non-pharmacologic comfort measures Outcome: Progressing   Problem: Health Behavior/Discharge Planning: Goal: Ability to manage health-related needs will improve Outcome: Progressing   

## 2020-01-05 NOTE — Progress Notes (Signed)
Patient increasingly agitated and verbally aggressive. Patient has removed safety mitts and yellow socks. Patient increasingly physically aggressive and refuses to be touched. Lorazepam (ativan) injection 2 mg, lamotrigine tablet 100 mg, and Aripiprazole tablet 5 mg administered to patient at this time. Several staff members present in room at this time. Will reassess patient.

## 2020-01-06 MED ORDER — QUETIAPINE FUMARATE 100 MG PO TABS
100.0000 mg | ORAL_TABLET | Freq: Two times a day (BID) | ORAL | Status: DC
  Administered 2020-01-06 – 2020-01-09 (×5): 100 mg
  Filled 2020-01-06 (×5): qty 1

## 2020-01-06 NOTE — Plan of Care (Signed)

## 2020-01-06 NOTE — Evaluation (Signed)
Clinical/Bedside Swallow Evaluation Patient Details  Name: Isaac [name] MRN: [id number] Date of Birth: 02/13/1981  Today's Date: 01/06/2020 Time: SLP Start Time (ACUTE ONLY): 1724 SLP Stop Time (ACUTE ONLY): 1759 SLP Time Calculation (min) (ACUTE ONLY): 35 min  Past Medical History:  Past Medical History:  Diagnosis Date  . History of bacterial meningitis 01/01/2020  . NSTEMI (non-ST elevated myocardial infarction) (HCC) 09/28/2019   Past Surgical History: History reviewed. No pertinent surgical history. 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 He had a G-tube placed on 10/16/2019 was discharged from [facility] on 10/18/2019. He has been receiving PT/OT as an outpatient, but I cannot find any SLP records. BSE requested.   Assessment / Plan / Recommendation Clinical Impression  Pt seen for a clinical swallow evaluation. No family present. Pt was asleep upon arrival and was awakened for evaluation. He was minimally verbally responsive with mostly unintelligible speech. Pt agreeable to ice chips, sips of water, and orange sherbet. Pt presents with cognitive based dysphagia with reduced attention to task (tunes in and out, also lethargy), but no overt signs of reduced airway protection. Pt with occasional labial spillage due to variable alertness. He consumed 240 ml water via straw sips and a container of orange sherbet. Care  Everywhere notes reviewed and it appears that Pt is only receiving PT/OT and that his PCP wanted MBSS. Unsure whether Pt would participate in MBSS, however we can try. Can also initiate po given current performance on clinical evaluation today, but will need feeder assist for D2/ground and thin liquids. Will initiate diet and follow up with Pt tomorrow. Pt must be fully alert for all po and will need 100% supervision/assist due to cognitive deficits.    SLP Visit Diagnosis: Dysphagia, unspecified (R13.10)    Aspiration Risk  Mild aspiration risk;Risk for inadequate nutrition/hydration    Diet Recommendation NPO;Dysphagia 2 (Fine chop);Thin liquid (Pt has PEG)   Liquid Administration via: Straw Medication Administration: Via alternative means Supervision: Staff to assist with self feeding;Full supervision/cueing for compensatory strategies Compensations: Slow rate;Follow solids with liquid Postural Changes: Remain upright for at least 30 minutes after po intake;Seated upright at 90 degrees    Other  Recommendations Oral Care Recommendations: Oral care BID;Staff/trained caregiver to provide oral care Other Recommendations: Clarify dietary restrictions   Follow up Recommendations Outpatient SLP      Frequency and Duration min 2x/week  1 week       Prognosis Prognosis for Safe Diet Advancement: Fair Barriers to Reach Goals: Cognitive deficits      Swallow Study   General Date of Onset: 01/01/20 HPI: Isaac [name] is a 39 yo male who [facility] and diagnosed with pneumococcal meningitis, while at [facility] in July 2021 for pneumococcal meningitis patient was found to have 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 He had a G-tube placed on 10/16/2019 was discharged from [facility] on 10/18/2019. He has been receiving PT/OT as an outpatient, but I cannot find any SLP records. BSE requested. Type of Study: Bedside Swallow Evaluation Previous Swallow Assessment: None on record Diet Prior to this Study: NPO;PEG tube Temperature Spikes Noted: No Respiratory Status: Room air History of Recent Intubation: No Behavior/Cognition: Requires cueing;Cooperative;Lethargic/Drowsy Oral Cavity Assessment: Within Functional Limits Oral Care Completed by SLP: No Oral Cavity - Dentition: Adequate natural dentition Self-Feeding Abilities: Total assist Patient Positioning: Upright in bed Baseline Vocal Quality: Normal Volitional Cough: Cognitively unable to elicit Volitional Swallow: Unable to elicit    Oral/Motor/Sensory Function Overall Oral Motor/Sensory Function: Within functional limits (appears WFL, Pt minimally following commands)   Ice Chips Ice chips: Within functional limits Presentation: Spoon   Thin Liquid Thin Liquid: Within functional limits Presentation: Straw    Nectar Thick Nectar Thick Liquid: Impaired (sherbet) Presentation: Spoon Oral Phase Impairments: Reduced labial seal;Reduced lingual movement/coordination Oral phase functional implications: Oral holding   Honey Thick Honey Thick Liquid: Not tested   Puree Puree: Not tested   Solid     Solid: Not tested     Thank you,  Havery Moros, CCC-SLP [phone]  Lucyle Alumbaugh 01/06/2020,6:10 PM

## 2020-01-06 NOTE — Progress Notes (Signed)
Pt verbally and physically aggressive. Continues to exit the bed unable to redirect Physician notified. Family at bedside

## 2020-01-06 NOTE — Progress Notes (Signed)
Patient Demographics:    Isaac Isaac [name], is a 39 y.o. Isaac [name], DOB - [birth year], UEA:[id number]  Admit date - 01/01/2020   Admitting Physician Bobette Mo, MD  Outpatient Primary MD for the patient is Pcp, No  LOS - 5   Chief Complaint  Patient presents with  . Altered Mental Status        Subjective:     Isaac Isaac [name] was increasingly agitated today.  He had to be placed in mitts and soft wrist restraints.  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 - -Also had history of NSTEMI due to demand ischemia during his meningitis hospitalization   -In July 2021 at [facility] where he underwent LP and diagnosed with pneumococcal meningitis, while at [facility] in July 2021 for pneumococcal meningitis patient was found to have 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 -More recently, he has been becoming increasingly agitated and combative. He requires assistance with essential ADLs such as bathing, dressing and using toilet.   -He was initially admitted to [facility] due to  concerns for possible UTI and escalating behaviors -UTI since and ruled out -Suspect that his mental status is approaching his new baseline -Since family is unable to provide adequate care at home, attempts are being made to place patient in a skilled facility  A/p 1) acute metabolic encephalopathy--- worsening behavioral problems superimposed on underlying anoxic brain injury -Add Abilify 5 mg twice daily -Continue Lamictal 100 mg daily -Keppra was recently discontinued as patient's EEG was nonepileptiform --Lorazepam or Haldol as needed agitation -seroquel dose is being adjusted to help with periods of agitation -I suspect that his overall mental status with periods of agitation is approaching baseline  2) 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 - He is easily angry, gets agitated and may kick and punch.   He does not talk much.  He is still incontinent.  He requires assistance with essential ADLs such as bathing, dressing and using toilet -Family does not feel that they can safely manage the patient at home and are requesting placement to facility -Social work consult to help  with placement requested  5) chronic anemia-multifactorial , partly nutritional, expect further drop in H&H with hydration/hemodilution  6) presumed UTI--patient does not meet sepsis criteria -treated empirically with IV Rocephin  -urine culture negative -antibiotics have been discontinued  Disposition/Need for in-Hospital Stay- patient unable to be discharged at this time due to -behavioral  problems requiring medication adjustments, --At this time patient does not have a safe discharge plan, patient's mother unable to manage him at home, needs SNF facility  Status is: Inpatient  Remains inpatient appropriate because:behavioral problems requiring medication adjustments,   Disposition: The patient is from: Home              Anticipated d/c is to: SNF              Anticipated d/c date is: 2 days              Patient currently is medically stable to d/c. Barriers: Not Clinically Stable- -behavioral problems requiring medication adjustments, --At this time patient does not have a safe discharge plan, patient's mother unable to manage him at home, needs SNF facility  Code Status : Full   Family Communication: Discussed with patient's mother over the phone Consults  :  na  DVT Prophylaxis  :  Lovenox -   - SCDs   Lab Results  Component Value Date   PLT 273 01/02/2020    Inpatient Medications  Scheduled Meds: . ARIPiprazole  5 mg Oral BID  . enoxaparin (LOVENOX) injection  40 mg Subcutaneous Q24H  . lamoTRIgine  100 mg Oral Daily  . QUEtiapine  50 mg Per Tube BID   Continuous Infusions: . dextrose 5 % and 0.9% NaCl 100 mL/hr at 01/02/20 0338   PRN Meds:.acetaminophen **OR** acetaminophen, haloperidol lactate, LORazepam, ondansetron **OR** ondansetron (ZOFRAN) IV, polyvinyl alcohol    Anti-infectives (From admission, onward)   Start     Dose/Rate Route Frequency Ordered Stop   01/02/20 2200  cefTRIAXone (ROCEPHIN) 1 g in sodium chloride 0.9 % 100 mL IVPB  Status:  Discontinued        1 g 200 mL/hr over 30 Minutes Intravenous Every 24 hours 01/01/20 2228 01/06/20 0854   01/01/20 2200  cefTRIAXone (ROCEPHIN) 1 g in sodium chloride 0.9 % 100 mL IVPB        1 g 200 mL/hr over 30 Minutes Intravenous  Once 01/01/20 2149 01/02/20 0026        Objective:   Vitals:   01/03/20 2200 01/04/20 0600 01/04/20 1419 01/06/20 1316  BP: 116/79 121/74 117/75 126/70  Pulse:    73 77  Resp: 17 16 17 20   Temp:   98.5 F (36.9 C) 98.4 F (36.9 C)  TempSrc: Axillary  Oral Oral  SpO2: 100% 100% 98%   Weight:      Height:        Wt Readings from Last 3 Encounters:  01/02/20 83.2 kg  12/09/19 81.6 kg    No intake or output data in the 24 hours ending 01/06/20 2006  Physical Exam  General exam: somnolent Respiratory system: Clear to auscultation. Respiratory effort normal. Cardiovascular system:RRR. No murmurs, rubs, gallops. Gastrointestinal system: Abdomen is nondistended, soft and nontender. No organomegaly or masses felt. Normal bowel sounds heard. PEG tube in place Central nervous system: somnolent, does not participate in exam Extremities: No C/C/E, +pedal pulses Skin: No rashes, lesions or ulcers Psychiatry: somnolent     Data Review:   Micro Results Recent Results (from the past 240 hour(s))  Respiratory Panel by RT PCR (Flu A&B, Covid) - Nasopharyngeal Swab     Status: None   Collection Time: 01/01/20  4:30 PM   Specimen: Nasopharyngeal Swab  Result Value Ref Range Status   SARS Coronavirus 2 by RT PCR NEGATIVE NEGATIVE Final    Comment: (NOTE) SARS-CoV-2 target nucleic acids are NOT DETECTED.  The SARS-CoV-2 RNA is generally detectable in upper respiratoy specimens during the acute phase of infection. The lowest concentration of SARS-CoV-2 viral copies this assay can detect is 131 copies/mL. A negative result does not preclude SARS-Cov-2 infection and should not be used as the sole basis for treatment or other patient management decisions. A negative result may occur with  improper specimen collection/handling, submission of specimen other than nasopharyngeal swab, presence of viral mutation(s) within the areas targeted by this assay, and inadequate number of viral copies (<131 copies/mL). A negative result must be combined with clinical observations, patient history, and epidemiological information. The expected result is  Negative.  Fact Sheet for Patients:  https://www.moore.com/  Fact Sheet for Healthcare Providers:  https://www.young.biz/  This test is no t yet approved or cleared by the [place] FDA and  has been authorized for detection and/or diagnosis of SARS-CoV-2 by FDA under an Emergency Use Authorization (EUA). This EUA will remain  in effect (meaning this test can be used) for the duration of the COVID-19 declaration under Section 564(b)(1) of the Act, 21 U.S.C. section 360bbb-3(b)(1), unless the authorization is terminated or revoked sooner.     Influenza A by PCR NEGATIVE NEGATIVE Final   Influenza B by PCR NEGATIVE NEGATIVE Final    Comment: (NOTE) The Xpert Xpress SARS-CoV-2/FLU/RSV assay is intended as an aid in  the diagnosis of influenza from Nasopharyngeal swab specimens and  should not be used as a sole basis for treatment. Nasal washings and  aspirates are unacceptable for Xpert Xpress SARS-CoV-2/FLU/RSV  testing.  Fact Sheet for Patients: https://www.moore.com/  Fact Sheet for Healthcare Providers: https://www.young.biz/  This test is not yet approved or cleared by the [place] FDA and  has been authorized for detection and/or diagnosis of SARS-CoV-2 by  FDA under an Emergency Use Authorization (EUA). This EUA will remain  in effect (meaning this test can be used) for the duration of the  Covid-19 declaration under Section 564(b)(1) of the Act, 21  U.S.C. section 360bbb-3(b)(1), unless the authorization is  terminated or revoked. Performed at [facility], [address], [place], [place] [address]   Culture, Urine     Status: None   Collection Time: 01/03/20  3:35 PM   Specimen: Urine, Clean Catch  Result Value Ref Range Status   Specimen Description   Final    URINE, CLEAN CATCH Performed at [facility], [address] [place], [place] [address]    Special Requests   Final     NONE Performed at [facility], [address] [place], [place] [address]    Culture   Final    NO GROWTH Performed at [facility], [address], [place], [place] [address]    Report Status 01/05/2020 FINAL  Final    Radiology Reports CT Head Wo Contrast  Result Date: 01/01/2020 CLINICAL DATA:  Altered mental status. EXAM: CT HEAD WITHOUT CONTRAST TECHNIQUE: Contiguous axial images were obtained from the base of the skull through the vertex without intravenous contrast. COMPARISON:  December 28, 2019 FINDINGS: Brain: There is mild cerebral atrophy with widening of the extra-axial spaces and ventricular dilatation.  There are areas of decreased attenuation within the white matter tracts of the supratentorial brain, consistent with microvascular disease changes. Vascular: No hyperdense vessel or unexpected calcification. Skull: Normal. Negative for fracture or focal lesion. Sinuses/Orbits: A 1.2 cm x 1.1 cm left maxillary sinus polyp versus mucous retention cyst is seen. Other: None. IMPRESSION: 1. Generalized cerebral atrophy. 2. No acute intracranial abnormality. 3. Left maxillary sinus polyp versus mucous retention cyst. Electronically Signed   By: Aram Candela M.D.   On: 01/01/2020 21:33   CT Angio Chest PE W/Cm &/Or Wo Cm  Result Date: 01/01/2020 CLINICAL DATA:  Abnormal chest x-ray and tachycardia EXAM: CT ANGIOGRAPHY CHEST WITH CONTRAST TECHNIQUE: Multidetector CT imaging of the chest was performed using the standard protocol during bolus administration of intravenous contrast. Multiplanar CT image reconstructions and MIPs were obtained to evaluate the vascular anatomy. CONTRAST:  OMNIPAQUE IOHEXOL 350 MG/ML SOLN COMPARISON:  Radiograph same day FINDINGS: Cardiovascular: There is a optimal opacification of the pulmonary arteries. There is no central,segmental, or subsegmental filling defects within the pulmonary arteries. The heart is normal in size. No pericardial effusion  or thickening. No evidence right heart strain. There is normal three-vessel brachiocephalic anatomy without proximal stenosis. The thoracic aorta is normal in appearance. Mediastinum/Nodes: No hilar, mediastinal, or axillary adenopathy. Thyroid gland, trachea, and esophagus demonstrate no significant findings. Lungs/Pleura: The lungs are clear. No pleural effusion or pneumothorax. No airspace consolidation. Upper Abdomen: No acute abnormalities present in the visualized portions of the upper abdomen. Percutaneous gastrotomy tube seen within the mid body of the stomach. Musculoskeletal: No chest wall abnormality. No acute or significant osseous findings. Review of the MIP images confirms the above findings. IMPRESSION: No central, segmental, or subsegmental pulmonary embolism. No other acute intrathoracic pathology to explain the patient's symptoms. Electronically Signed   By: Jonna Clark M.D.   On: 01/01/2020 21:33   DG Chest Portable 1 View  Result Date: 01/01/2020 CLINICAL DATA:  Increased agitation for 4 days, history of brain injury EXAM: PORTABLE CHEST 1 VIEW COMPARISON:  Portable exam 1550 hours compared to 12/28/2019 and 09/28/2019 FINDINGS: Normal heart size, mediastinal contours, and pulmonary vascularity. New somewhat focal but ill-defined opacity in the RIGHT mid lung question pneumonia versus superimposed artifact. Remaining lungs clear. No pleural effusion or pneumothorax. Osseous structures unremarkable. IMPRESSION: New somewhat focal but ill-defined opacity in RIGHT mid lung question pneumonia; this was not seen on the prior study of 12/28/2019 nor on an earlier exam from 09/28/2019. Radiographic follow-up until resolution recommended to exclude pulmonary nodule. Electronically Signed   By: Ulyses Southward M.D.   On: 01/01/2020 16:05     CBC Recent Labs  Lab 01/01/20 1522 01/02/20 0145  WBC Isaac.0 8.5  HGB 11.2* 10.4*  HCT 34.Isaac* 32.3*  PLT 280 273  MCV 91.3 93.4  MCH 29.5 30.1  MCHC 32.3  32.2  RDW 13.3 13.4  LYMPHSABS 2.5  --   MONOABS 0.Isaac  --   EOSABS 0.2  --   BASOSABS 0.0  --     Chemistries  Recent Labs  Lab 01/01/20 1522 01/01/20 2238  NA 136  --   K 4.0  --   CL 98  --   CO2 26  --   GLUCOSE 83  --   BUN Isaac  --   CREATININE 0.60*  --   CALCIUM 9.3  --   MG  --  2.1  AST 14*  --   ALT 15  --  ALKPHOS 46  --   BILITOT 0.5  --    ------------------------------------------------------------------------------------------------------------------ No results for input(s): CHOL, HDL, LDLCALC, TRIG, CHOLHDL, LDLDIRECT in the last 72 hours.  No results found for: HGBA1C ------------------------------------------------------------------------------------------------------------------ No results for input(s): TSH, T4TOTAL, T3FREE, THYROIDAB in the last 72 hours.  Invalid input(s): FREET3 ------------------------------------------------------------------------------------------------------------------ No results for input(s): VITAMINB12, FOLATE, FERRITIN, TIBC, IRON, RETICCTPCT in the last 72 hours.  Coagulation profile No results for input(s): INR, PROTIME in the last 168 hours.  No results for input(s): DDIMER in the last 72 hours.  Cardiac Enzymes No results for input(s): CKMB, TROPONINI, MYOGLOBIN in the last 168 hours.  Invalid input(s): CK ------------------------------------------------------------------------------------------------------------------ No results found for: BNP   Fidelis Loth MemErick Blinkson M.D on 01/06/2020 at 8:06 PM  Go to [email or web address] - for contact info  [facility] - Office  [phone]

## 2020-01-06 NOTE — TOC Progression Note (Signed)
Transition of Care [facility]) - Progression Note    Patient Details  Name: Isaac [name] MRN: [id number] Date of Birth: [birth year]  Transition of Care [facility] CM/SW Contact  Isaac Gault, LCSW Phone Number: 01/06/2020, 1:41 PM  Clinical Narrative:     TOC following. Spoke with Isaac [name] at [facility] regarding possible referral. Per Isaac [name], they do not have long term beds right now. Isaac [name] mentioned one of their sister facilities might be able to assist. Isaac [name] provided Research scientist (physical sciences), Isaac [name], contact information. This LCSW called Isaac [name] [phone])  to discuss. Per Isaac [name], referral can be faxed [phone]) to her and she can review for all of their regional facilities. Isaac [name] stated that if [facility] were to offer an LOG, it would have to be for a 2-3 year period while the medicaid disability is pending and until it is fully approved. Updated TOC supervisor who will let TOC know if the referral should be faxed to [facility] or not.  At this time, the only facilities that have not declined patient are [facility], [address], [place]. Decisions from these facilities are pending. 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   TOC will follow.    Barriers to Discharge: Inadequate or no insurance, SNF Pending payor source - LOG, SNF Pending Medicaid, SNF Pending bed offer  Expected Discharge Plan and Services                                                 Social Determinants of Health (SDOH) Interventions    Readmission Risk Interventions No flowsheet data found.

## 2020-01-07 DIAGNOSIS — R451 Restlessness and agitation: Secondary | ICD-10-CM

## 2020-01-07 DIAGNOSIS — I69319 Unspecified symptoms and signs involving cognitive functions following cerebral infarction: Secondary | ICD-10-CM

## 2020-01-07 MED ORDER — CEFDINIR 300 MG PO CAPS
300.0000 mg | ORAL_CAPSULE | Freq: Two times a day (BID) | ORAL | Status: DC
  Administered 2020-01-07 – 2020-01-13 (×13): 300 mg via ORAL
  Filled 2020-01-07 (×14): qty 1

## 2020-01-07 MED ORDER — DIAZEPAM 5 MG PO TABS
5.0000 mg | ORAL_TABLET | Freq: Two times a day (BID) | ORAL | Status: DC
  Administered 2020-01-08 – 2020-02-23 (×92): 5 mg via ORAL
  Filled 2020-01-07 (×93): qty 1

## 2020-01-07 MED ORDER — DIAZEPAM 5 MG PO TABS: 5.0000 mg | ORAL_TABLET | Freq: Every day | ORAL | Status: DC

## 2020-01-07 MED ORDER — SODIUM CHLORIDE 0.9 % IV SOLN: 1.0000 g | INTRAVENOUS | Status: DC

## 2020-01-07 NOTE — Progress Notes (Signed)
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  He had elevated troponins over 5000.  Due to combativeness, he required sedation and subsequent intubation and transferred to [facility] where he underwent LP and diagnosed with pneumococcal meningitis.  He was started on ceftriaxone.  Due to possible seizure, he was started on Keppra and placed on continuous EEG which was negative for seizure activity.  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  He received a 5 day course of SoluMedrol for cerebral edema.  He then developed exteensor posturing with tachycardia and hypertension concerning for elevated intracranial pressure.  He underwent Bolt ICP monitoring which demonstrated no elevated ICP.  Follow up CT head and subsequent repeat MRI of brain showed small subacute hemorrhage in the right suboccipital lobe, either hemorrhagic conversion of small ischemic stroke vs extension of previously seen microhemorrhage, as well as hypoxic injury involving the bilateral caudate and putamen.  He also sustained a NSTEMI.  He was extubated on 10/10/2019 and underwent slow taper of Dilaudid to minimize withdrawal symptoms.  A G-tube was inserted on 10/16/2019 and he was discharged on 10/18/2019 under the care of his mother who is a CNA.    He has been going to outpatient PT/OT and has been getting stronger.  He is still taking food and medication via G-tube.  He has not been able to establish care with a PCP yet.  He is  easily angry and has kicked his mother.  He does not talk much and often when he speaks, it may be a curse word.  He is still incontinent.  He requires assistance with essential ADLs such as bathing, dressing and using toilet.  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--- worsening behavioral problems superimposed on underlying anoxic brain injury -Add Abilify 5 mg twice daily -Continue Lamictal 100 mg daily -Keppra was recently discontinued as patient's EEG was nonepileptiform and may have been contributing to increased agitation --Lorazepam or Haldol as needed agitation -continue to gradually titrate up seroquel -I have discussed the risks/benefits of seroquel including its "black box warning" with patient's mother  -she expresses understanding in light of the patient's behavioral issues -She expresses that other medications and change of venue/scenery have been tried to try to provide care for the patient -in light of her inability to care for the patient and failure of other modalities, she accepts the risks with seroquel in hopes that his behavior can be controlled to allow for care of the patient -I suspect that his overall mental status with periods of agitation is approaching baseline -add valium  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 -He is  easily angry, gets agitated and may kick and punch.  He does not talk much. He is still incontinent. He requires assistance with essential ADLs such as bathing, dressing and using toilet -Family does not feel that  they can safely manage the patient at home and are requesting placement to facility -Social work consult to help with placement requested  chronic anemia -multifactorial , partly nutritional, expect further drop in H&H with hydration/hemodilution  -check iron/tibc, ferritin, B12, folate  presumed UTI --patient does not meet sepsis criteria -treated empirically with IV Rocephin x 3 days  Disposition/Need for in-Hospital Stay- patient unable to be discharged at this time due to -behavioral problems requiring medication adjustments, --At this time patient does not have a safe discharge plan, patient's mother unable to manage him at home, needs SNF facility  Status is: Inpatient  Remains inpatient appropriate because:behavioral problems requiring medication adjustments     Status is: Inpatient  Remains inpatient appropriate because:Unsafe d/c plan   Dispo: The patient is from: Home              Anticipated d/c is to: SNF              Anticipated d/c date is: 2 days              Patient currently is not medically stable to d/c.        Family Communication:   Mother updated 11/3  Consultants:  none  Code Status:  FULL   DVT Prophylaxis:  [place] Lovenox   Procedures: As Listed in Progress Note Above  Antibiotics: None      Subjective: Patient intermittently answers questions.  Denies f/c, cp, sob.  Remainder ROS not obtainable  Objective: Vitals:   01/06/20 1316 01/06/20 2133 01/07/20 0500 01/07/20 1531  BP: 126/70 109/76 112/82 123/73  Pulse: 77 68 (!) 106 100  Resp: Temp: 98.4 F (36.9 C) (!) 97.5 F (36.4 C) 97.7 F (36.5 C) 97.9 F (36.6 C)  TempSrc: Oral Axillary Axillary Axillary  SpO2:  100%  100%  Weight:      Height:        Intake/Output Summary (Last 24 hours) at 01/07/2020 1650 Last data filed at 01/07/2020 1300 Gross per 24 hour  Intake 360 ml  Output --  Net 360 ml   Weight change:  Exam:   General:  Pt is alert,  intermittently follows commands appropriately, not in acute distress  HEENT: No icterus, No thrush, No neck mass, Foley/AT  Cardiovascular: RRR, S1/S2, no rubs, no gallops  Respiratory:  Poor inspiratory effort  Abdomen: Soft/+BS, non tender, non distended, no guarding  Extremities: No edema, No lymphangitis, No petechiae, No rashes, no synovitis   Data Reviewed: I have personally reviewed following labs and imaging studies Basic Metabolic Panel: Recent Labs  Lab 01/01/20 1522 01/01/20 2238  NA 136  --   K 4.0  --   CL 98  --   CO2 26  --   GLUCOSE 83  --   BUN 7  --   CREATININE 0.60*  --   CALCIUM 9.3  --   MG  --  2.1  PHOS  --  4.5   Liver Function Tests: Recent Labs  Lab 01/01/20 1522  AST 14*  ALT 15  ALKPHOS 46  BILITOT 0.5  PROT 7.3  ALBUMIN 4.0   No results for input(s): LIPASE, AMYLASE in the last 168 hours. No results for input(s): AMMONIA in  the last 168 hours. Coagulation Profile: No results for input(s): INR, PROTIME in the last 168 hours. CBC: Recent Labs  Lab 01/01/20 1522 01/02/20 0145  WBC 7.0 8.5  NEUTROABS 3.6  --   HGB 11.2* 10.4*  HCT 34.7* 32.3*  MCV 91.3 93.4  PLT 280 273   Cardiac Enzymes: No results for input(s): CKTOTAL, CKMB, CKMBINDEX, TROPONINI in the last 168 hours. BNP: Invalid input(s): POCBNP CBG: No results for input(s): GLUCAP in the last 168 hours. HbA1C: No results for input(s): HGBA1C in the last 72 hours. Urine analysis:    Component Value Date/Time   COLORURINE YELLOW 01/01/2020 2133   APPEARANCEUR HAZY (A) 01/01/2020 2133   LABSPEC 1.003 (L) 01/01/2020 2133   PHURINE 6.0 01/01/2020 2133   GLUCOSEU NEGATIVE 01/01/2020 2133   HGBUR LARGE (A) 01/01/2020 2133   BILIRUBINUR NEGATIVE 01/01/2020 2133   KETONESUR 20 (A) 01/01/2020 2133   PROTEINUR 30 (A) 01/01/2020 2133   NITRITE NEGATIVE 01/01/2020 2133   LEUKOCYTESUR LARGE (A) 01/01/2020 2133   Sepsis  Labs: @LABRCNTIP (procalcitonin:4,lacticidven:4) ) Recent Results (from the past 240 hour(s))  Respiratory Panel by RT PCR (Flu A&B, Covid) - Nasopharyngeal Swab     Status: None   Collection Time: 01/01/20  4:30 PM   Specimen: Nasopharyngeal Swab  Result Value Ref Range Status   SARS Coronavirus 2 by RT PCR NEGATIVE NEGATIVE Final    Comment: (NOTE) SARS-CoV-2 target nucleic acids are NOT DETECTED.  The SARS-CoV-2 RNA is generally detectable in upper respiratoy specimens during the acute phase of infection. The lowest concentration of SARS-CoV-2 viral copies this assay can detect is 131 copies/mL. A negative result does not preclude SARS-Cov-2 infection and should not be used as the sole basis for treatment or other patient management decisions. A negative result may occur with  improper specimen collection/handling, submission of specimen other than nasopharyngeal swab, presence of viral mutation(s) within the areas targeted by this assay, and inadequate number of viral copies (<131 copies/mL). A negative result must be combined with clinical observations, patient history, and epidemiological information. The expected result is Negative.  Fact Sheet for Patients:  01/03/20  Fact Sheet for Healthcare Providers:  https://www.moore.com/  This test is no t yet approved or cleared by the https://www.young.biz/ FDA and  has been authorized for detection and/or diagnosis of SARS-CoV-2 by FDA under an Emergency Use Authorization (EUA). This EUA will remain  in effect (meaning this test can be used) for the duration of the COVID-19 declaration under Section 564(b)(1) of the Act, 21 U.S.C. section 360bbb-3(b)(1), unless the authorization is terminated or revoked sooner.     Influenza A by PCR NEGATIVE NEGATIVE Final   Influenza B by PCR NEGATIVE NEGATIVE Final    Comment: (NOTE) The Xpert Xpress SARS-CoV-2/FLU/RSV assay is intended as an  aid in  the diagnosis of influenza from Nasopharyngeal swab specimens and  should not be used as a sole basis for treatment. Nasal washings and  aspirates are unacceptable for Xpert Xpress SARS-CoV-2/FLU/RSV  testing.  Fact Sheet for Patients: [place]  Fact Sheet for Healthcare Providers: https://www.moore.com/  This test is not yet approved or cleared by the https://www.young.biz/ FDA and  has been authorized for detection and/or diagnosis of SARS-CoV-2 by  FDA under an Emergency Use Authorization (EUA). This EUA will remain  in effect (meaning this test can be used) for the duration of the  Covid-19 declaration under Section 564(b)(1) of the Act, 21  U.S.C. section 360bbb-3(b)(1), unless the authorization is  terminated or revoked. Performed at [facility], [address], [place], [place] [address]   Culture, Urine     Status: None   Collection Time: 01/03/20  3:35 PM   Specimen: Urine, Clean Catch  Result Value Ref Range Status   Specimen Description   Final    URINE, CLEAN CATCH Performed at [facility], [address], [place], [place] [address]    Special Requests   Final    NONE Performed at [facility], [address], [place], [place] [address]    Culture   Final    NO GROWTH Performed at [facility], [address] [place], [place] [address]    Report Status 01/05/2020 FINAL  Final     Scheduled Meds: . ARIPiprazole  5 mg Oral BID  . diazepam  5 mg Oral Q12H  . enoxaparin (LOVENOX) injection  40 mg Subcutaneous Q24H  . lamoTRIgine  100 mg Oral Daily  . QUEtiapine  100 mg Per Tube BID   Continuous Infusions: . dextrose 5 % and 0.9% NaCl 100 mL/hr at 01/02/20 2458    Procedures/Studies: CT Head Wo Contrast  Result Date: 01/01/2020 CLINICAL DATA:  Altered mental status. EXAM: CT HEAD WITHOUT CONTRAST TECHNIQUE: Contiguous axial images were obtained from the base of the skull through the vertex without  intravenous contrast. COMPARISON:  December 28, 2019 FINDINGS: Brain: There is mild cerebral atrophy with widening of the extra-axial spaces and ventricular dilatation. There are areas of decreased attenuation within the white matter tracts of the supratentorial brain, consistent with microvascular disease changes. Vascular: No hyperdense vessel or unexpected calcification. Skull: Normal. Negative for fracture or focal lesion. Sinuses/Orbits: A 1.2 cm x 1.1 cm left maxillary sinus polyp versus mucous retention cyst is seen. Other: None. IMPRESSION: 1. Generalized cerebral atrophy. 2. No acute intracranial abnormality. 3. Left maxillary sinus polyp versus mucous retention cyst. Electronically Signed   By: Aram Candela M.D.   On: 01/01/2020 21:33   CT Angio Chest PE W/Cm &/Or Wo Cm  Result Date: 01/01/2020 CLINICAL DATA:  Abnormal chest x-ray and tachycardia EXAM: CT ANGIOGRAPHY CHEST WITH CONTRAST TECHNIQUE: Multidetector CT imaging of the chest was performed using the standard protocol during bolus administration of intravenous contrast. Multiplanar CT image reconstructions and MIPs were obtained to evaluate the vascular anatomy. CONTRAST:  OMNIPAQUE IOHEXOL 350 MG/ML SOLN COMPARISON:  Radiograph same day FINDINGS: Cardiovascular: There is a optimal opacification of the pulmonary arteries. There is no central,segmental, or subsegmental filling defects within the pulmonary arteries. The heart is normal in size. No pericardial effusion or thickening. No evidence right heart strain. There is normal three-vessel brachiocephalic anatomy without proximal stenosis. The thoracic aorta is normal in appearance. Mediastinum/Nodes: No hilar, mediastinal, or axillary adenopathy. Thyroid gland, trachea, and esophagus demonstrate no significant findings. Lungs/Pleura: The lungs are clear. No pleural effusion or pneumothorax. No airspace consolidation. Upper Abdomen: No acute abnormalities present in the visualized  portions of the upper abdomen. Percutaneous gastrotomy tube seen within the mid body of the stomach. Musculoskeletal: No chest wall abnormality. No acute or significant osseous findings. Review of the MIP images confirms the above findings. IMPRESSION: No central, segmental, or subsegmental pulmonary embolism. No other acute intrathoracic pathology to explain the patient's symptoms. Electronically Signed   By: Jonna Clark M.D.   On: 01/01/2020 21:33   DG Chest Portable 1 View  Result Date: 01/01/2020 CLINICAL DATA:  Increased agitation for 4 days, history of brain injury EXAM: PORTABLE CHEST  1 VIEW COMPARISON:  Portable exam 1550 hours compared to 12/28/2019 and 09/28/2019 FINDINGS: Normal heart size, mediastinal contours, and pulmonary vascularity. New somewhat focal but ill-defined opacity in the RIGHT mid lung question pneumonia versus superimposed artifact. Remaining lungs clear. No pleural effusion or pneumothorax. Osseous structures unremarkable. IMPRESSION: New somewhat focal but ill-defined opacity in RIGHT mid lung question pneumonia; this was not seen on the prior study of 12/28/2019 nor on an earlier exam from 09/28/2019. Radiographic follow-up until resolution recommended to exclude pulmonary nodule. Electronically Signed   By: Ulyses SouthwardMark  Boles M.D.   On: 01/01/2020 16:05    Catarina Hartshornavid Samiksha Pellicano, DO  [facility]  If 7PM-7AM, please contact night-coverage [email or web address] Password [email or web address] 01/07/2020, 4:50 PM   LOS: 6 days

## 2020-01-07 NOTE — Progress Notes (Signed)
  Speech Language Pathology Treatment: Dysphagia  Patient Details Name: Isaac [name] MRN: [id number] DOB: [birth year] Today's Date: 01/07/2020 Time: 0900-0920 SLP Time Calculation (min) (ACUTE ONLY): 20 min  Assessment / Plan / Recommendation Clinical Impression  Pt seen for ongoing dysphagia intervention following BSE completed yesterday. Pt alert, but easily agitated today. He initially smiled and says he remembers me from yesterday (listened to music), however then adamantly refused po trials. Breakfast tray was untouched. SLP sat with Pt from a distance and offered food/drink, however Pt repeated, "No man!". Pt's mother arrived and SLP gathered additional information regarding PEG tube. It appears that Pt has not had an objective swallow evaluation and PEG was placed due to inability to participate in po intake while hospitalized. He has not had anything by mouth since hospitalized (July 2021) and did not receive follow up SLP services after discharge. Pt continues to present with cognitive deficits which impact eating by mouth. Pt refused all trials today despite redirection and encouragement. Pt's mother stated that he often asked for food/drink at home, but she was afraid to give him anything. SLP encouraged Pt's mother to offer po and bring in food/drink she thinks he might like. Pt currently refusing. Continue diet as ordered. Pt's Mom asked if PEG could be removed and SLP explained that he would need to demonstrate adequate po intake over an extended period of time before that was considered. SLP will continue to follow. Above to RN.   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 He had a G-tube placed on 10/16/2019 was discharged from [facility] on 10/18/2019. He has been receiving PT/OT as an outpatient, but I cannot find any SLP records. BSE requested.      SLP Plan  Continue with current plan of care       Recommendations  Diet recommendations: Dysphagia 2 (fine chop);Thin liquid Liquids provided via: Straw;Cup Medication Administration: Via alternative means Supervision: Full supervision/cueing for compensatory strategies;Patient able to self feed;Staff to assist with self feeding Compensations: Slow rate;Follow solids with liquid Postural Changes and/or Swallow Maneuvers: Seated upright 90 degrees;Upright 30-60 min after meal                Oral Care Recommendations: Oral care BID;Staff/trained caregiver to provide oral care Follow up Recommendations: Outpatient SLP;Skilled Nursing facility SLP Visit Diagnosis: Dysphagia, unspecified (R13.10) Plan: Continue with current plan of care       Thank you,  Havery Moros, CCC-SLP [phone]                 Isaac [name] 01/07/2020, 10:40 AM

## 2020-01-08 DIAGNOSIS — N3001 Acute cystitis with hematuria: Secondary | ICD-10-CM

## 2020-01-08 LAB — T4, FREE: Free T4: 0.64 ng/dL (ref 0.61–1.12)

## 2020-01-08 LAB — FOLATE: Folate: 7.1 ng/mL (ref >5.9)

## 2020-01-08 LAB — IRON AND TIBC
Iron: 28 ug/dL — ABNORMAL LOW (ref 45–182)
Saturation Ratios: 9 % — ABNORMAL LOW (ref 17.9–39.5)
TIBC: 301 ug/dL (ref 250–450)
UIBC: 273 ug/dL

## 2020-01-08 LAB — COMPREHENSIVE METABOLIC PANEL
ALT: 14 U/L (ref 0–44)
AST: 18 U/L (ref 15–41)
Albumin: 3.9 g/dL (ref 3.5–5.0)
Alkaline Phosphatase: 49 U/L (ref 38–126)
Anion gap: 9 (ref 5–15)
BUN: 11 mg/dL (ref 6–20)
CO2: 27 mmol/L (ref 22–32)
Calcium: 8.8 mg/dL — ABNORMAL LOW (ref 8.9–10.3)
Chloride: 101 mmol/L (ref 98–111)
Creatinine, Ser: 0.9 mg/dL (ref 0.61–1.24)
GFR, Estimated: >60 mL/min (ref >60)
Glucose, Bld: 121 mg/dL — ABNORMAL HIGH (ref 70–99)
Potassium: 3.7 mmol/L (ref 3.5–5.1)
Sodium: 137 mmol/L (ref 135–145)
Total Bilirubin: 0.4 mg/dL (ref 0.3–1.2)
Total Protein: 6.8 g/dL (ref 6.5–8.1)

## 2020-01-08 LAB — CBC
HCT: 33.4 % — ABNORMAL LOW (ref 39.0–52.0)
Hemoglobin: 10.7 g/dL — ABNORMAL LOW (ref 13.0–17.0)
MCH: 29.2 pg (ref 26.0–34.0)
MCHC: 32 g/dL (ref 30.0–36.0)
MCV: 91.3 fL (ref 80.0–100.0)
Platelets: 372 10*3/uL (ref 150–400)
RBC: 3.66 MIL/uL — ABNORMAL LOW (ref 4.22–5.81)
RDW: 13.2 % (ref 11.5–15.5)
WBC: 6 10*3/uL (ref 4.0–10.5)
nRBC: 0 % (ref 0.0–0.2)

## 2020-01-08 LAB — FERRITIN: Ferritin: 105 ng/mL (ref 24–336)

## 2020-01-08 LAB — TSH: TSH: 0.375 u[IU]/mL (ref 0.350–4.500)

## 2020-01-08 LAB — VITAMIN B12: Vitamin B-12: 2310 pg/mL — ABNORMAL HIGH (ref 180–914)

## 2020-01-08 MED ORDER — ENSURE ENLIVE PO LIQD
237.0000 mL | Freq: Two times a day (BID) | ORAL | Status: DC
  Administered 2020-01-08 – 2020-02-21 (×72): 237 mL via ORAL

## 2020-01-08 MED ORDER — DIAZEPAM 5 MG PO TABS
5.0000 mg | ORAL_TABLET | Freq: Once | ORAL | Status: AC
  Administered 2020-01-08: 5 mg via ORAL
  Filled 2020-01-08: qty 1

## 2020-01-08 NOTE — TOC Progression Note (Signed)
Transition of Care [facility]) - Progression Note    Patient Details  Name: Isaac [name] MRN: [id number] Date of Birth: 1980-03-15  [facility] CM/SW Contact  Barry Brunner, LCSW Phone Number: 01/08/2020, 2:49 PM  Clinical Narrative:    CSW received patient's Medicaid number [id number] J. CSW refaxed SNF referrals with number in comment to facility. CSW LVM with Tresa Endo with updated number and request to revisit SNF referrals. TOC to follow.     Barriers to Discharge: Continued Medical Work up, SNF Pending bed offer, Inadequate or no insurance  Expected Discharge Plan and Services                                                 Social Determinants of Health (SDOH) Interventions    Readmission Risk Interventions No flowsheet data found.

## 2020-01-08 NOTE — Progress Notes (Signed)
Nutrition Follow-up  DOCUMENTATION CODES:   Not applicable  INTERVENTION:  48 hour calorie count initiated, will follow-up with day 1 results 11/05  Ensure Enlive po BID, each supplement provides 350 kcal and 20 grams of protein  Hormel Shake on breakfast tray, each supplement provides 520 kcal and 22 grams of protein  Magic cup BID with meals, each supplement provides 290 kcal and 9 grams of protein  Encouraged po intake of meals and supplements  Pt is at risk for refeeding, recommend monitoring magnesium, potassium, and phosphorus daily as po intake improves    NUTRITION DIAGNOSIS:   Inadequate oral intake related to inability to eat as evidenced by  (PEG tube dependent for nutrition/hydration and medications s/p multiple subacute strokes with residual weakness secondary to recent bacterial meningitis). -ongoing  GOAL:   Patient will meet greater than or equal to 90% of their needs -progressing  MONITOR:   PO intake, Weight trends, Labs, Diet advancement, I & O's  REASON FOR ASSESSMENT:   Rounds    ASSESSMENT:   39 year old male with history of recent hospitalization for bacterial meningitis (7/24-8/14). 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 Pt presented with progressively worsening mental status, increased agitation and family reports unable to safely manage him at home.  10/28-admit for acute UTI  Patient is s/p BSE in 11/2, diet advanced to DYS 2 (fine chop) with thin liquids. Pt has not received tube feedings since admission. RD ordered 48 hour calorie count to assess adequacy of po intake, envelope is on pt door with instructions.  Patient awake and alert this morning, wife present at bedside. RD explained calorie count and was encouraging of po intake. Pt agreeable to trying Ensure supplement today. He allowed RD to  hold Ensure while he took a sip from straw, says he liked it but did not want anymore at this time. Wife reports she will continue to encourage meal/supplement intake when visiting.   Medications reviewed and include: Abilify, Cefdinir, Valium, Seroquel  No new labs for review. Pt is at risk for refeeding given no nutrition x 5 days prior to DYS 2 diet advancement. Recommend monitoring magnesium, potassium, and phosphorus daily for at least 3 days, MD to replete as needed.   Diet Order:   Diet Order            DIET DYS 2 Room service appropriate? Yes; Fluid consistency: Thin  Diet effective now                 EDUCATION NEEDS:   Education needs have been addressed  Skin:  Skin Assessment: Reviewed RN Assessment  Last BM:  11/1 type 6  Height:   Ht Readings from Last 1 Encounters:  01/02/20 6\' 2"  (1.88 m)    Weight:   Wt Readings from Last 1 Encounters:  01/02/20 83.2 kg    BMI:  Body mass index is 23.55 kg/m.  Estimated Nutritional Needs:   Kcal:  2080-2330  Protein:  105-115  Fluid:  >/= 2 L/day   06-10-1970, RD, LDN Clinical Nutrition After Hours/Weekend Pager # in Amion

## 2020-01-08 NOTE — Progress Notes (Signed)
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 He had elevated troponins over 5000.Due to combativeness, he required sedation and subsequent intubation [facility] with [facility]. He was started on ceftriaxone. Due to possible seizure, he was started on Keppra and placed on continuous EEG which was negative for seizure activity. 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 He received a 5 day course of SoluMedrol for cerebral edema. He then developed exteensor posturing with tachycardia and hypertension concerning for elevated intracranial pressure. He underwent Bolt ICP monitoring which demonstrated no elevated ICP. Follow up CT head and subsequent repeat MRI of brain showed small subacute hemorrhage in the right suboccipital lobe, either hemorrhagic conversion of small ischemic stroke vs extension of previously seen microhemorrhage, as well as hypoxic injury involving the bilateral caudate and putamen. He also sustained a NSTEMI. He was extubated on 10/10/2019 and underwent slow taper of Dilaudid to minimize withdrawal symptoms. A G-tube was inserted on 10/16/2019 and he was discharged on 10/18/2019 under the care of his mother who is a CNA.   He has been going to outpatient PT/OT and has been getting stronger. He is still taking food and medication via G-tube. He has not been able to establish care with a PCP yet. He is  easily angry and has kicked his mother. He does not talk much and often when he speaks, it may be a curse word. He is still incontinent. He requires assistance with essential ADLs such as bathing, dressing and using toilet.  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--- worsening behavioral problems superimposed on underlying anoxic brain injury -Add Abilify 5 mg twice daily -Continue Lamictal 100 mg daily -Keppra was recently discontinued as patient's EEG was nonepileptiform and may have been contributing to increased agitation --Lorazepam or Haldol as needed agitation -continue to gradually titrate up seroquel -I have discussed the risks/benefits of seroquel including its "black box warning" with patient's mother  -she expresses understanding in light of the patient's behavioral issues -She expresses that other medications and change of venue/scenery have been tried to try to provide care for the patient -in light of her inability to care for the patient and failure of other modalities, she accepts the risks with seroquel in hopes that his behavior can be controlled to allow for care of the patient -I suspect that his overall mental status with periods of agitation is his usual baseline at home -continue valium bid -increased seroquel to 100 mg bid -11/4--gave additional valium pre-emptively-->pt allowed blood draw  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 -He  is easily  angry, gets agitated and may kick and punch. He does not talk much. He is still incontinent. He requires assistance with essential ADLs such as bathing, dressing and using toilet -Familydoes not feel that they can safely manage the patient at home and arerequesting placement to facility -Social work consult to help with placement requested  chronic anemia -multifactorial , partly nutritional, expect further drop in H&H with hydration/hemodilution  -check iron/tibc, ferritin, B12, folate  presumed UTI --patient does not meet sepsis criteria -treatedempirically with IV Rocephin x 3 days  Disposition/Need for in-Hospital Stay- patient unable to be discharged at this time due to -behavioral problems requiring medication adjustments, --At this time patient does not have a safe discharge plan, patient's mother unable to manage him at home, needs SNF facility  Status is: Inpatient  Remains inpatient appropriate because:behavioral problems requiring medication adjustments     Status is: Inpatient  Remains inpatient appropriate because:Unsafe d/c plan   Dispo: The patient is from: Home  Anticipated d/c is to: SNF  Anticipated d/c date is: 3 days  Patient currently is not medically stable to d/c.        Family Communication:   Mother updated 11/4  Consultants:  none  Code Status:  FULL   DVT Prophylaxis:  [place] Lovenox   Procedures: As Listed in Progress Note Above  Antibiotics: None   Subjective: Patient appears less agitated this day shift.  Denies f/c, cp, sob, abd pain  Objective: Vitals:   01/06/20 2133 01/07/20 0500 01/07/20 1531 01/08/20 1441  BP: 109/76 112/82 123/73 104/78  Pulse: 68 (!) 106 100 (!) 106  Resp: 20 18 18 18   Temp: (!) 97.5 F (36.4 C) 97.7 F (36.5 C) 97.9 F (36.6 C) 98.4 F (36.9 C)  TempSrc: Axillary Axillary Axillary   SpO2: 100%  100% 99%  Weight:      Height:         Intake/Output Summary (Last 24 hours) at 01/08/2020 1634 Last data filed at 01/08/2020 0800 Gross per 24 hour  Intake 400 ml  Output 200 ml  Net 200 ml   Weight change:  Exam:   General:  Pt is alert, follows commands appropriately, not in acute distress  HEENT: No icterus, No thrush, No neck mass, [place]/AT  Cardiovascular: RRR, S1/S2, no rubs, no gallops  Respiratory: fine bibasilar crackles. No wheeze  Abdomen: Soft/+BS, non tender, non distended, no guarding  Extremities: No edema, No lymphangitis, No petechiae, No rashes, no synovitis   Data Reviewed: I have personally reviewed following labs and imaging studies Basic Metabolic Panel: Recent Labs  Lab 01/01/20 2238  MG 2.1  PHOS 4.5   Liver Function Tests: No results for input(s): AST, ALT, ALKPHOS, BILITOT, PROT, ALBUMIN in the last 168 hours. No results for input(s): LIPASE, AMYLASE in the last 168 hours. No results for input(s): AMMONIA in the last 168 hours. Coagulation Profile: No results for input(s): INR, PROTIME in the last 168 hours. CBC: Recent Labs  Lab 01/02/20 0145  WBC 8.5  HGB 10.4*  HCT 32.3*  MCV 93.4  PLT 273   Cardiac Enzymes: No results for input(s): CKTOTAL, CKMB, CKMBINDEX, TROPONINI in the last 168 hours. BNP: Invalid input(s): POCBNP CBG: No results for input(s): GLUCAP in the last 168 hours. HbA1C: No results for input(s): HGBA1C in the last 72 hours. Urine analysis:    Component Value Date/Time   COLORURINE YELLOW 01/01/2020 2133   APPEARANCEUR HAZY (A) 01/01/2020 2133   LABSPEC 1.003 (  L) 01/01/2020 2133   PHURINE 6.0 01/01/2020 2133   GLUCOSEU NEGATIVE 01/01/2020 2133   HGBUR LARGE (A) 01/01/2020 2133   BILIRUBINUR NEGATIVE 01/01/2020 2133   KETONESUR 20 (A) 01/01/2020 2133   PROTEINUR 30 (A) 01/01/2020 2133   NITRITE NEGATIVE 01/01/2020 2133   LEUKOCYTESUR LARGE (A) 01/01/2020 2133   Sepsis Labs: @LABRCNTIP (procalcitonin:4,lacticidven:4) ) Recent Results  (from the past 240 hour(s))  Respiratory Panel by RT PCR (Flu A&B, Covid) - Nasopharyngeal Swab     Status: None   Collection Time: 01/01/20  4:30 PM   Specimen: Nasopharyngeal Swab  Result Value Ref Range Status   SARS Coronavirus 2 by RT PCR NEGATIVE NEGATIVE Final    Comment: (NOTE) SARS-CoV-2 target nucleic acids are NOT DETECTED.  The SARS-CoV-2 RNA is generally detectable in upper respiratoy specimens during the acute phase of infection. The lowest concentration of SARS-CoV-2 viral copies this assay can detect is 131 copies/mL. A negative result does not preclude SARS-Cov-2 infection and should not be used as the sole basis for treatment or other patient management decisions. A negative result may occur with  improper specimen collection/handling, submission of specimen other than nasopharyngeal swab, presence of viral mutation(s) within the areas targeted by this assay, and inadequate number of viral copies (<131 copies/mL). A negative result must be combined with clinical observations, patient history, and epidemiological information. The expected result is Negative.  Fact Sheet for Patients:  https://www.moore.com/https://www.fda.gov/media/142436/download  Fact Sheet for Healthcare Providers:  https://www.young.biz/https://www.fda.gov/media/142435/download  This test is no t yet approved or cleared by the [place] FDA and  has been authorized for detection and/or diagnosis of SARS-CoV-2 by FDA under an Emergency Use Authorization (EUA). This EUA will remain  in effect (meaning this test can be used) for the duration of the COVID-19 declaration under Section 564(b)(1) of the Act, 21 U.S.C. section 360bbb-3(b)(1), unless the authorization is terminated or revoked sooner.     Influenza A by PCR NEGATIVE NEGATIVE Final   Influenza B by PCR NEGATIVE NEGATIVE Final    Comment: (NOTE) The Xpert Xpress SARS-CoV-2/FLU/RSV assay is intended as an aid in  the diagnosis of influenza from Nasopharyngeal swab specimens  and  should not be used as a sole basis for treatment. Nasal washings and  aspirates are unacceptable for Xpert Xpress SARS-CoV-2/FLU/RSV  testing.  Fact Sheet for Patients: https://www.moore.com/https://www.fda.gov/media/142436/download  Fact Sheet for Healthcare Providers: https://www.young.biz/https://www.fda.gov/media/142435/download  This test is not yet approved or cleared by the [place] FDA and  has been authorized for detection and/or diagnosis of SARS-CoV-2 by  FDA under an Emergency Use Authorization (EUA). This EUA will remain  in effect (meaning this test can be used) for the duration of the  Covid-19 declaration under Section 564(b)(1) of the Act, 21  U.S.C. section 360bbb-3(b)(1), unless the authorization is  terminated or revoked. Performed at [facility], [address] [place], [place] [address]   Culture, Urine     Status: None   Collection Time: 01/03/20  3:35 PM   Specimen: Urine, Clean Catch  Result Value Ref Range Status   Specimen Description   Final    URINE, CLEAN CATCH Performed at [facility], [address] [place], [place] [address]    Special Requests   Final    NONE Performed at [facility], [address] [place], [place] [address]    Culture   Final    NO GROWTH Performed at [facility], [address] [place], [place] [address]  Report Status 01/05/2020 FINAL  Final     Scheduled Meds: . ARIPiprazole  5 mg Oral BID  . cefdinir  300 mg Oral Q12H  . diazepam  5 mg Oral Q12H  . enoxaparin (LOVENOX) injection  40 mg Subcutaneous Q24H  . feeding supplement  237 mL Oral BID BM  . lamoTRIgine  100 mg Oral Daily  . QUEtiapine  100 mg Per Tube BID   Continuous Infusions: . dextrose 5 % and 0.9% NaCl 100 mL/hr at 01/02/20 1610    Procedures/Studies: CT Head Wo Contrast  Result Date: 01/01/2020 CLINICAL DATA:  Altered mental status. EXAM: CT HEAD WITHOUT CONTRAST TECHNIQUE: Contiguous axial images were obtained from the base of the skull through the vertex  without intravenous contrast. COMPARISON:  December 28, 2019 FINDINGS: Brain: There is mild cerebral atrophy with widening of the extra-axial spaces and ventricular dilatation. There are areas of decreased attenuation within the white matter tracts of the supratentorial brain, consistent with microvascular disease changes. Vascular: No hyperdense vessel or unexpected calcification. Skull: Normal. Negative for fracture or focal lesion. Sinuses/Orbits: A 1.2 cm x 1.1 cm left maxillary sinus polyp versus mucous retention cyst is seen. Other: None. IMPRESSION: 1. Generalized cerebral atrophy. 2. No acute intracranial abnormality. 3. Left maxillary sinus polyp versus mucous retention cyst. Electronically Signed   By: Aram Candela M.D.   On: 01/01/2020 21:33   CT Angio Chest PE W/Cm &/Or Wo Cm  Result Date: 01/01/2020 CLINICAL DATA:  Abnormal chest x-ray and tachycardia EXAM: CT ANGIOGRAPHY CHEST WITH CONTRAST TECHNIQUE: Multidetector CT imaging of the chest was performed using the standard protocol during bolus administration of intravenous contrast. Multiplanar CT image reconstructions and MIPs were obtained to evaluate the vascular anatomy. CONTRAST:  OMNIPAQUE IOHEXOL 350 MG/ML SOLN COMPARISON:  Radiograph same day FINDINGS: Cardiovascular: There is a optimal opacification of the pulmonary arteries. There is no central,segmental, or subsegmental filling defects within the pulmonary arteries. The heart is normal in size. No pericardial effusion or thickening. No evidence right heart strain. There is normal three-vessel brachiocephalic anatomy without proximal stenosis. The thoracic aorta is normal in appearance. Mediastinum/Nodes: No hilar, mediastinal, or axillary adenopathy. Thyroid gland, trachea, and esophagus demonstrate no significant findings. Lungs/Pleura: The lungs are clear. No pleural effusion or pneumothorax. No airspace consolidation. Upper Abdomen: No acute abnormalities present in the  visualized portions of the upper abdomen. Percutaneous gastrotomy tube seen within the mid body of the stomach. Musculoskeletal: No chest wall abnormality. No acute or significant osseous findings. Review of the MIP images confirms the above findings. IMPRESSION: No central, segmental, or subsegmental pulmonary embolism. No other acute intrathoracic pathology to explain the patient's symptoms. Electronically Signed   By: Jonna Clark M.D.   On: 01/01/2020 21:33   DG Chest Portable 1 View  Result Date: 01/01/2020 CLINICAL DATA:  Increased agitation for 4 days, history of brain injury EXAM: PORTABLE CHEST 1 VIEW COMPARISON:  Portable exam 1550 hours compared to 12/28/2019 and 09/28/2019 FINDINGS: Normal heart size, mediastinal contours, and pulmonary vascularity. New somewhat focal but ill-defined opacity in the RIGHT mid lung question pneumonia versus superimposed artifact. Remaining lungs clear. No pleural effusion or pneumothorax. Osseous structures unremarkable. IMPRESSION: New somewhat focal but ill-defined opacity in RIGHT mid lung question pneumonia; this was not seen on the prior study of 12/28/2019 nor on an earlier exam from 09/28/2019. Radiographic follow-up until resolution recommended to exclude pulmonary nodule. Electronically Signed   By: Ulyses Southward M.D.   On: 01/01/2020  16:05    Catarina Hartshorn, DO  [facility]  If 7PM-7AM, please contact night-coverage [email or web address] Password [email or web address] 01/08/2020, 4:34 PM   LOS: 7 days

## 2020-01-08 NOTE — Progress Notes (Signed)
Patient with agitation and aggressive behavior requiring continued titration of medications -still requires restraints for safety

## 2020-01-08 NOTE — Progress Notes (Signed)
  Speech Language Pathology Treatment: Dysphagia  Patient Details Name: Isaac [name] MRN: [id number] DOB: 1980-10-01 Today's Date: 01/08/2020 Time: 9371-6967 SLP Time Calculation (min) (ACUTE ONLY): 24 min  Assessment / Plan / Recommendation Clinical Impression  Pt seen for ongoing dysphagia intervention. SLP spoke with [name] earlier today and shared the plan for starting him on food by mouth. She was encouraged that he ate ice cream with SLP previously. SLP suggested that family bring in foods that Pt might like and offer foods to him. SLP returned later in the afternoon and Pt agreeable to consume his lunch tray. SLP fed Pt due to mitts. He consume ground textures, regular textures (peanut butter crackers), and thin liquids without incident. Pt verbalized, "Yes M'am" and "thank you" frequently throughout the visit. Recommend advancing diet to regular textures and thin liquids with supervision during meals due to cognitive deficits, ok for po medication whole in puree. Above to Dr. Arbutus Leas. SLP will follow.     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 He had a G-tube placed on 10/16/2019 was discharged from [facility] on 10/18/2019. He has been receiving PT/OT as an outpatient, but I cannot find any SLP records. BSE requested.      SLP Plan  Continue with current plan of care       Recommendations  Diet recommendations:  Regular;Thin liquid Liquids provided via: Straw;Cup Medication Administration: Whole meds with puree Supervision: Full supervision/cueing for compensatory strategies;Patient able to self feed;Staff to assist with self feeding Compensations: Slow rate;Follow solids with liquid Postural Changes and/or Swallow Maneuvers: Seated upright 90 degrees;Upright 30-60 min after meal                Oral Care Recommendations: Oral care BID;Staff/trained caregiver to provide oral care Follow up Recommendations: Outpatient SLP;Skilled Nursing facility SLP Visit Diagnosis: Dysphagia, unspecified (R13.10) Plan: Continue with current plan of care       Thank you,  Isaac [name], CCC-SLP [phone]                 Isaac [name] 01/08/2020, 8:31 PM

## 2020-01-09 LAB — RPR: RPR Ser Ql: NONREACTIVE

## 2020-01-09 MED ORDER — FOLIC ACID 1 MG PO TABS
1.0000 mg | ORAL_TABLET | Freq: Every day | ORAL | Status: DC
  Administered 2020-01-09 – 2020-02-23 (×45): 1 mg via ORAL
  Filled 2020-01-09 (×47): qty 1

## 2020-01-09 MED ORDER — FERROUS SULFATE 325 (65 FE) MG PO TABS
325.0000 mg | ORAL_TABLET | Freq: Two times a day (BID) | ORAL | Status: DC
  Administered 2020-01-10 – 2020-02-04 (×42): 325 mg via ORAL
  Filled 2020-01-09 (×45): qty 1

## 2020-01-09 MED ORDER — QUETIAPINE FUMARATE 25 MG PO TABS
150.0000 mg | ORAL_TABLET | Freq: Two times a day (BID) | ORAL | Status: DC
  Administered 2020-01-09 – 2020-02-03 (×49): 150 mg
  Filled 2020-01-09 (×2): qty 1
  Filled 2020-01-09 (×12): qty 2
  Filled 2020-01-09: qty 1
  Filled 2020-01-09: qty 2
  Filled 2020-01-09: qty 1
  Filled 2020-01-09 (×13): qty 2
  Filled 2020-01-09: qty 1
  Filled 2020-01-09 (×13): qty 2
  Filled 2020-01-09: qty 1
  Filled 2020-01-09 (×6): qty 2

## 2020-01-09 NOTE — Progress Notes (Signed)
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 He had elevated troponins over 5000.Due to combativeness, he required sedation and subsequent intubation [facility]. He was started on ceftriaxone. Due to possible seizure, he was started on Keppra and placed on continuous EEG which was negative for seizure activity. 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 He received a 5 day course of SoluMedrol for cerebral edema. He then developed exteensor posturing with tachycardia and hypertension concerning for elevated intracranial pressure. He underwent Bolt ICP monitoring which demonstrated no elevated ICP. Follow up CT head and subsequent repeat MRI of brain showed small subacute hemorrhage in the right suboccipital lobe, either hemorrhagic conversion of small ischemic stroke vs extension of previously seen microhemorrhage, as well as hypoxic injury involving the bilateral caudate and putamen. He also sustained a NSTEMI. He was extubated on 10/10/2019 and underwent slow taper of Dilaudid to minimize withdrawal symptoms. A G-tube was inserted on 10/16/2019 and he was discharged on 10/18/2019 under the care of his mother who is a CNA.   He has been going to outpatient PT/OT and has been getting stronger. He is still taking food and medication via G-tube. He has not been able to establish care with a PCP yet. He  is easily angry and has kicked his mother. He does not talk much and often when he speaks, it may be a curse word. He is still incontinent. He requires assistance with essential ADLs such as bathing, dressing and using toilet.  Assessment/Plan: Acuteon chronicmetabolic encephalopathy--- worsening behavioral problems superimposed on underlying anoxic brain injury -Add Abilify 5 mg twice daily -Continue Lamictal 100 mg daily -Keppra was recently discontinued as patient's EEG was nonepileptiformand may have been contributing to increased agitation --Lorazepam or Haldol as needed agitation -continue to gradually titrate up seroquel -I have discussed the risks/benefits of seroquel including its "black box warning" with patient's mother  -she expresses understanding in light of the patient's behavioral issues -She expresses that other medications and change of venue/scenery have been tried to try to provide care for the patient -in light of her inability to care for the patient and failure of other modalities, she accepts the risks with seroquel in hopes that his behavior can be controlled to allow for care of the patient -I suspect that his overall mental status with periods of agitation is his usual baseline at home -continue valium bid -11/5 increased seroquel to 150 mg bid  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 -He is easily angry, gets agitated and may kick and punch. He  does not  talk much. He is still incontinent. He requires assistance with essential ADLs such as bathing, dressing and using toilet -Familydoes not feel that they can safely manage the patient at home and arerequesting placement to facility -Social work consult to help with placement requested  chronic anemia/iron deficiency anemia -multifactorial , partly nutritional, expect further drop in H&H with hydration/hemodilution -iron sat--9% -folate 7.1 -B12--2310 -start po iron and folate  presumed UTI --patient does not meet sepsis criteria -treatedempirically with IV Rocephinx 3 days  Disposition/Need for in-Hospital Stay- patient unable to be discharged at this time due to -behavioral problems requiring medication adjustments, --At this time patient does not have a safe discharge plan, patient's mother unable to manage him at home, needs SNF facility  Status is: Inpatient  Remains inpatient appropriate because:behavioral problems requiring medication adjustments     Status is: Inpatient  Remains inpatient appropriate because:Unsafe d/c plan   Dispo: The patient is from:Home Anticipated d/c is to:SNF Anticipated d/c date is: 3 days Patient currently is not medically stable to d/c.        Family Communication:Mother updated 11/4  Consultants:none  Code Status: FULL   DVT Prophylaxis: [place] Lovenox   Procedures: As Listed in Progress Note Above  Antibiotics: None    Subjective: Patient denies fevers, chills, headache, chest pain, dyspnea, nausea, vomiting, diarrhea, abdominal pain, dysuria, hematuria, hematochezia, and melena.   Objective: Vitals:   01/07/20 1531 01/08/20 1441 01/08/20 2025 01/09/20 0645  BP: 123/73 104/78  101/73  Pulse: 100 (!) 106  80  Resp: 18 18    Temp: 97.9 F (36.6 C) 98.4 F (36.9 C)  (!) 97.4 F (36.3 C)  TempSrc: Axillary   Axillary  SpO2: 100% 99% 97%  100%  Weight:      Height:        Intake/Output Summary (Last 24 hours) at 01/09/2020 1837 Last data filed at 01/09/2020 1753 Gross per 24 hour  Intake 1320 ml  Output 0 ml  Net 1320 ml   Weight change:  Exam:   General:  Pt is alert, follows commands appropriately, not in acute distress  HEENT: No icterus, No thrush, No neck mass, [place]/AT  Cardiovascular: RRR, S1/S2, no rubs, no gallops  Respiratory: CTA bilaterally, no wheezing, no crackles, no rhonchi  Abdomen: Soft/+BS, non tender, non distended, no guarding  Extremities: No edema, No lymphangitis, No petechiae, No rashes, no synovitis   Data Reviewed: I have personally reviewed following labs and imaging studies Basic Metabolic Panel: Recent Labs  Lab 01/08/20 1611  NA 137  K 3.7  CL 101  CO2 27  GLUCOSE 121*  BUN 11  CREATININE 0.90  CALCIUM 8.8*   Liver Function Tests: Recent Labs  Lab 01/08/20 1611  AST 18  ALT 14  ALKPHOS 49  BILITOT 0.4  PROT 6.8  ALBUMIN 3.9   No results for input(s): LIPASE, AMYLASE in the last 168 hours. No results for input(s): AMMONIA in the last 168 hours. Coagulation Profile: No results for input(s): INR, PROTIME in the last 168 hours. CBC: Recent Labs  Lab 01/08/20 1611  WBC 6.0  HGB 10.7*  HCT 33.4*  MCV 91.3  PLT 372   Cardiac Enzymes: No results for input(s): CKTOTAL, CKMB, CKMBINDEX, TROPONINI in the last 168 hours. BNP: Invalid input(s): POCBNP CBG: No results for input(s): GLUCAP in the last 168 hours. HbA1C: No results for input(s): HGBA1C in the last 72 hours. Urine analysis:    Component Value Date/Time  COLORURINE YELLOW 01/01/2020 2133   APPEARANCEUR HAZY (A) 01/01/2020 2133   LABSPEC 1.003 (L) 01/01/2020 2133   PHURINE 6.0 01/01/2020 2133   GLUCOSEU NEGATIVE 01/01/2020 2133   HGBUR LARGE (A) 01/01/2020 2133   BILIRUBINUR NEGATIVE 01/01/2020 2133   KETONESUR 20 (A) 01/01/2020 2133   PROTEINUR 30 (A) 01/01/2020 2133   NITRITE NEGATIVE  01/01/2020 2133   LEUKOCYTESUR LARGE (A) 01/01/2020 2133   Sepsis Labs: @LABRCNTIP (procalcitonin:4,lacticidven:4) ) Recent Results (from the past 240 hour(s))  Respiratory Panel by RT PCR (Flu A&B, Covid) - Nasopharyngeal Swab     Status: None   Collection Time: 01/01/20  4:30 PM   Specimen: Nasopharyngeal Swab  Result Value Ref Range Status   SARS Coronavirus 2 by RT PCR NEGATIVE NEGATIVE Final    Comment: (NOTE) SARS-CoV-2 target nucleic acids are NOT DETECTED.  The SARS-CoV-2 RNA is generally detectable in upper respiratoy specimens during the acute phase of infection. The lowest concentration of SARS-CoV-2 viral copies this assay can detect is 131 copies/mL. A negative result does not preclude SARS-Cov-2 infection and should not be used as the sole basis for treatment or other patient management decisions. A negative result may occur with  improper specimen collection/handling, submission of specimen other than nasopharyngeal swab, presence of viral mutation(s) within the areas targeted by this assay, and inadequate number of viral copies (<131 copies/mL). A negative result must be combined with clinical observations, patient history, and epidemiological information. The expected result is Negative.  Fact Sheet for Patients:  01/03/20  Fact Sheet for Healthcare Providers:  https://www.moore.com/  This test is no t yet approved or cleared by the https://www.young.biz/ FDA and  has been authorized for detection and/or diagnosis of SARS-CoV-2 by FDA under an Emergency Use Authorization (EUA). This EUA will remain  in effect (meaning this test can be used) for the duration of the COVID-19 declaration under Section 564(b)(1) of the Act, 21 U.S.C. section 360bbb-3(b)(1), unless the authorization is terminated or revoked sooner.     Influenza A by PCR NEGATIVE NEGATIVE Final   Influenza B by PCR NEGATIVE NEGATIVE Final    Comment:  (NOTE) The Xpert Xpress SARS-CoV-2/FLU/RSV assay is intended as an aid in  the diagnosis of influenza from Nasopharyngeal swab specimens and  should not be used as a sole basis for treatment. Nasal washings and  aspirates are unacceptable for Xpert Xpress SARS-CoV-2/FLU/RSV  testing.  Fact Sheet for Patients: [place]  Fact Sheet for Healthcare Providers: https://www.moore.com/  This test is not yet approved or cleared by the https://www.young.biz/ FDA and  has been authorized for detection and/or diagnosis of SARS-CoV-2 by  FDA under an Emergency Use Authorization (EUA). This EUA will remain  in effect (meaning this test can be used) for the duration of the  Covid-19 declaration under Section 564(b)(1) of the Act, 21  U.S.C. section 360bbb-3(b)(1), unless the authorization is  terminated or revoked. Performed at [facility], [address] [place], [place]   Culture, Urine     Status: None   Collection Time: 01/03/20  3:35 PM   Specimen: Urine, Clean Catch  Result Value Ref Range Status   Specimen Description   Final    URINE, CLEAN CATCH Performed at [facility], [address], [place], [place]    Special Requests   Final    NONE Performed at [facility], [address] [place], [place]    Culture   Final    NO GROWTH Performed  at [facility], [address], [place], [place] [address]    Report Status 01/05/2020 FINAL  Final     Scheduled Meds: . ARIPiprazole  5 mg Oral BID  . cefdinir  300 mg Oral Q12H  . diazepam  5 mg Oral Q12H  . enoxaparin (LOVENOX) injection  40 mg Subcutaneous Q24H  . feeding supplement  237 mL Oral BID BM  . lamoTRIgine  100 mg Oral Daily  . QUEtiapine  100 mg Per Tube BID   Continuous Infusions: . dextrose 5 % and 0.9% NaCl 100 mL/hr at 01/02/20 9147    Procedures/Studies: CT Head Wo Contrast  Result Date: 01/01/2020 CLINICAL DATA:  Altered mental  status. EXAM: CT HEAD WITHOUT CONTRAST TECHNIQUE: Contiguous axial images were obtained from the base of the skull through the vertex without intravenous contrast. COMPARISON:  December 28, 2019 FINDINGS: Brain: There is mild cerebral atrophy with widening of the extra-axial spaces and ventricular dilatation. There are areas of decreased attenuation within the white matter tracts of the supratentorial brain, consistent with microvascular disease changes. Vascular: No hyperdense vessel or unexpected calcification. Skull: Normal. Negative for fracture or focal lesion. Sinuses/Orbits: A 1.2 cm x 1.1 cm left maxillary sinus polyp versus mucous retention cyst is seen. Other: None. IMPRESSION: 1. Generalized cerebral atrophy. 2. No acute intracranial abnormality. 3. Left maxillary sinus polyp versus mucous retention cyst. Electronically Signed   By: Aram Candela M.D.   On: 01/01/2020 21:33   CT Angio Chest PE W/Cm &/Or Wo Cm  Result Date: 01/01/2020 CLINICAL DATA:  Abnormal chest x-ray and tachycardia EXAM: CT ANGIOGRAPHY CHEST WITH CONTRAST TECHNIQUE: Multidetector CT imaging of the chest was performed using the standard protocol during bolus administration of intravenous contrast. Multiplanar CT image reconstructions and MIPs were obtained to evaluate the vascular anatomy. CONTRAST:  OMNIPAQUE IOHEXOL 350 MG/ML SOLN COMPARISON:  Radiograph same day FINDINGS: Cardiovascular: There is a optimal opacification of the pulmonary arteries. There is no central,segmental, or subsegmental filling defects within the pulmonary arteries. The heart is normal in size. No pericardial effusion or thickening. No evidence right heart strain. There is normal three-vessel brachiocephalic anatomy without proximal stenosis. The thoracic aorta is normal in appearance. Mediastinum/Nodes: No hilar, mediastinal, or axillary adenopathy. Thyroid gland, trachea, and esophagus demonstrate no significant findings. Lungs/Pleura: The  lungs are clear. No pleural effusion or pneumothorax. No airspace consolidation. Upper Abdomen: No acute abnormalities present in the visualized portions of the upper abdomen. Percutaneous gastrotomy tube seen within the mid body of the stomach. Musculoskeletal: No chest wall abnormality. No acute or significant osseous findings. Review of the MIP images confirms the above findings. IMPRESSION: No central, segmental, or subsegmental pulmonary embolism. No other acute intrathoracic pathology to explain the patient's symptoms. Electronically Signed   By: Jonna Clark M.D.   On: 01/01/2020 21:33   DG Chest Portable 1 View  Result Date: 01/01/2020 CLINICAL DATA:  Increased agitation for 4 days, history of brain injury EXAM: PORTABLE CHEST 1 VIEW COMPARISON:  Portable exam 1550 hours compared to 12/28/2019 and 09/28/2019 FINDINGS: Normal heart size, mediastinal contours, and pulmonary vascularity. New somewhat focal but ill-defined opacity in the RIGHT mid lung question pneumonia versus superimposed artifact. Remaining lungs clear. No pleural effusion or pneumothorax. Osseous structures unremarkable. IMPRESSION: New somewhat focal but ill-defined opacity in RIGHT mid lung question pneumonia; this was not seen on the prior study of 12/28/2019 nor on an earlier exam from 09/28/2019. Radiographic follow-up until resolution recommended to exclude  pulmonary nodule. Electronically Signed   By: Ulyses Southward M.D.   On: 01/01/2020 16:05    Catarina Hartshorn, DO  [facility]  If 7PM-7AM, please contact night-coverage [email or web address] Password [facility] 01/09/2020, 6:37 PM   LOS: 8 days

## 2020-01-09 NOTE — Progress Notes (Signed)
  Speech Language Pathology Treatment: Dysphagia  Patient Details Name: Isaac [name] MRN: [id number] DOB: [birth year] Today's Date: 01/09/2020 Time: 9323-5573 SLP Time Calculation (min) (ACUTE ONLY): 42 min  Assessment / Plan / Recommendation Clinical Impression  Pt seen today for ongoing diagnostic dysphagia therapy; Pt consumed majority of his lunch tray with SLP consuming regular textures, thin liquids and puree textures without overt s/sx of aspiration; Pt required 1:1 feeder assist (Mom & SLP). Pt verbally responded with phrases "yes ma'am" and "thank you" throughout session. He was pleasant and cooperative. Per RN Pt consumed her entire regular breakfast tray without incident. There are no further ST needs noted at this time, recommend continue regular diet and thin liquids with meds to be administered whole with liquids. Thank you for this consult.    HPI HPI: Isaac [name] is a 39 yo male who [facility] with [facility], while at 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 He had a G-tube placed on 10/16/2019 was discharged from [facility] on 10[facility]18/2019. He has been receiving PT/OT as an outpatient, but I cannot find any SLP records. BSE requested.      SLP Plan  Continue with current plan of care       Recommendations  Diet recommendations: Regular;Thin liquid Liquids provided via: Straw;Cup Medication Administration: Whole meds with puree Supervision: Full supervision/cueing for compensatory  strategies;Patient able to self feed;Staff to assist with self feeding Compensations: Slow rate;Follow solids with liquid Postural Changes and/or Swallow Maneuvers: Seated upright 90 degrees;Upright 30-60 min after meal                Oral Care Recommendations: Oral care BID;Staff/trained caregiver to provide oral care Follow up Recommendations: Outpatient SLP;Skilled Nursing facility SLP Visit Diagnosis: Dysphagia, unspecified (R13.10) Plan: Continue with current plan of care         Adair Lauderback H. Romie Levee, CCC-SLP Speech Language Pathologist  Georgetta Haber 01/09/2020, 1:42 PM

## 2020-01-09 NOTE — Progress Notes (Signed)
Calorie Count Note  48-hr hour calorie count ordered. Results from day 1 following.  Diet: Regular Supplements: Ensure Enlive BID  11/4-Lunch: 400 kcal, 22 gr protein, 240 cc water 11/4-Dinner: 796 kcal, 32 gr protein, 240 cc tea 11/5- Breakfast: 999 kcal, 46 gr protein, 480 cc 11/5-Lunch: 445 kcal, 18 gr protein, 240 cc tea Supplements: 1050 kcal, 60 gr protein   Total intake: 3690 kcal, 178 gr protein   kcal (>100% of minimum estimated needs)   protein (>100% of minimum estimated needs)  Estimated Nutritional Needs:   Kcal:  2080-2330  Protein:  105-115  Fluid:  >/= 2 L/day  Nutrition Dx: Inadequate oral intake related to inability to eat as evidenced by  (PEG tube dependent for nutrition/hydration and medications s/p multiple subacute strokes with residual weakness secondary to recent bacterial meningitis).  -Patient progressing with advanced diet per ST and successfully tolerating oral nutrition.  Goal: Pt to meet >/= 90% of their estimated nutrition needs- expected to be meeting calorie/protein needs based on current calorie count data, nursing report and discussion with ST.   Intervention: -Continue 48-hr Calorie count -Continue Ensure Enlive BID -Magic cup with meals -Provide feeding assistance all meals  Royann Shivers MS,RD,CSG,LDN Pager: Loretha Stapler

## 2020-01-10 MED ORDER — SENNA 8.6 MG PO TABS
2.0000 | ORAL_TABLET | Freq: Every day | ORAL | Status: DC
  Administered 2020-01-10 – 2020-01-21 (×8): 17.2 mg via ORAL
  Filled 2020-01-10 (×10): qty 2

## 2020-01-10 MED ORDER — POLYETHYLENE GLYCOL 3350 17 G PO PACK
17.0000 g | PACK | Freq: Every day | ORAL | Status: DC
  Administered 2020-01-10 – 2020-02-23 (×33): 17 g via ORAL
  Filled 2020-01-10 (×39): qty 1

## 2020-01-10 NOTE — Progress Notes (Signed)
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 He had elevated troponins over 5000.Due to combativeness, he required sedation and subsequent intubation [facility] with pneum[facility]. He was started on ceftriaxone. Due to possible seizure, he was started on Keppra and placed on continuous EEG which was negative for seizure activity. 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 He received a 5 day course of SoluMedrol for cerebral edema. He then developed exteensor posturing with tachycardia and hypertension concerning for elevated intracranial pressure. He underwent Bolt ICP monitoring which demonstrated no elevated ICP. Follow up CT head and subsequent repeat MRI of brain showed small subacute hemorrhage in the right suboccipital lobe, either hemorrhagic conversion of small ischemic stroke vs extension of previously seen microhemorrhage, as well as hypoxic injury involving the bilateral caudate and putamen. He also sustained a NSTEMI. He was extubated on 10/10/2019 and underwent slow taper of Dilaudid to minimize withdrawal symptoms. A G-tube was inserted on 10/16/2019 and he was discharged on 10/18/2019 under the care of his mother who is a CNA.   He has been going to outpatient PT/OT and has been getting stronger. He is still taking food and medication via G-tube. He has not been able to establish care with a PCP yet. He is  easily angry and has kicked his mother. He does not talk much and often when he speaks, it may be a curse word. He is still incontinent. He requires assistance with essential ADLs such as bathing, dressing and using toilet.  Assessment/Plan: Acuteon chronicmetabolic encephalopathy--- worsening behavioral problems superimposed on underlying anoxic brain injury -Added Abilify 5 mg twice daily -Continue Lamictal 100 mg daily -Keppra was recently discontinued as patient's EEG was nonepileptiformand may have been contributing to increased agitation --Lorazepam or Haldol as needed agitation -continue to gradually titrate up seroquel -I have discussed the risks/benefits of seroquel including its "black box warning" with patient's mother  -she expresses understanding in light of the patient's behavioral issues -She expresses that other medications and change of venue/scenery have been tried to try to provide care for the patient -in light of her inability to care for the patient and failure of other modalities, she accepts the risks with seroquel in hopes that his behavior can be controlled to allow for care of the patient -I suspect that his overall mental status with periods of agitation ishis usual baseline at home -continuevalium bid -11/5 increased seroquel to 150 mg bid -11/6 overall less aggression but continues to have episodes of agitation  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 -He is easily angry, gets agitated and may kick and punch. He does not talk much. He is still incontinent. He requires assistance with essential ADLs such as bathing, dressing and using toilet -Familydoes not feel that they can safely manage the patient at home and arerequesting placement to facility -Social work consult to help with placement requested  chronic anemia/iron deficiency anemia -multifactorial , partly nutritional, expect further drop in H&H with hydration/hemodilution -iron sat--9% -folate 7.1 -B12--2310 -started po iron and folate  presumed UTI --patient does not meet sepsis criteria -treatedempirically with IV Rocephinx 3 days  Disposition/Need for in-Hospital Stay- patient unable to be discharged at this time due to -behavioral problems requiring medication adjustments, --At this time patient does not have a safe discharge plan, patient's mother unable to manage him at home, needs SNF facility  Status is: Inpatient  Remains inpatient appropriate because:behavioral problems requiring medication adjustments     Status is: Inpatient  Remains inpatient appropriate because:Unsafe d/c plan   Dispo: The patient is from:Home Anticipated d/c is to:SNF Anticipated d/c date is:3days Patient currently is not medically stable to d/c.        Family Communication:Mother updated 11/6  Consultants:none  Code Status: FULL   DVT Prophylaxis: [place] Lovenox   Procedures: As Listed in Progress Note Above  Antibiotics: None       Subjective: Patient denies fevers, chills, headache, chest pain, dyspnea, nausea, vomiting, diarrhea, abdominal pain, dysuria, hematuria, hematochezia, and melena.   Objective: Vitals:   01/09/20 2007 01/09/20 2141 01/10/20 0500 01/10/20 1350  BP:  120/84 117/68 127/75  Pulse:  72 70 (!) 108  Resp:   16 15 17   Temp:  97.9 F (36.6 C) 98 F (36.7 C) 98.8 F (37.1 C)  TempSrc:  Oral Axillary Axillary  SpO2: 98% 100% 100% 100%  Weight:      Height:        Intake/Output Summary (Last 24 hours) at 01/10/2020 1812 Last data filed at 01/09/2020 2145 Gross per 24 hour  Intake --  Output 800 ml  Net -800 ml   Weight change:  Exam:   General:  Pt is alert, follows commands appropriately, not in acute distress  HEENT: No icterus, No thrush, No neck mass, [place]/AT  Cardiovascular: RRR, S1/S2, no rubs, no gallops  Respiratory: CTA bilaterally, no wheezing, no crackles, no rhonchi  Abdomen: Soft/+BS, non tender, non distended, no guarding  Extremities: No edema, No lymphangitis, No petechiae, No rashes, no synovitis   Data Reviewed: I have personally reviewed following labs and imaging studies Basic Metabolic Panel: Recent Labs  Lab 01/08/20 1611  NA 137  K 3.7  CL 101  CO2 27  GLUCOSE 121*  BUN 11  CREATININE 0.90  CALCIUM 8.8*   Liver Function Tests: Recent Labs  Lab 01/08/20 1611  AST 18  ALT 14  ALKPHOS 49  BILITOT 0.4  PROT 6.8  ALBUMIN 3.9   No results for input(s): LIPASE, AMYLASE in the last 168 hours. No results for input(s): AMMONIA in the last 168 hours. Coagulation Profile: No results for input(s): INR, PROTIME in the last 168 hours. CBC: Recent Labs  Lab 01/08/20 1611  WBC 6.0  HGB 10.7*  HCT 33.4*  MCV 91.3  PLT 372   Cardiac Enzymes: No results for input(s): CKTOTAL, CKMB, CKMBINDEX, TROPONINI in the last 168 hours. BNP: Invalid input(s): POCBNP CBG: No results for input(s): GLUCAP in the last 168 hours. HbA1C: No results for input(s): HGBA1C in  the last 72 hours. Urine analysis:    Component Value Date/Time   COLORURINE YELLOW 01/01/2020 2133   APPEARANCEUR HAZY (A) 01/01/2020 2133   LABSPEC 1.003 (L) 01/01/2020 2133   PHURINE 6.0 01/01/2020 2133   GLUCOSEU NEGATIVE 01/01/2020 2133   HGBUR LARGE (A) 01/01/2020 2133    BILIRUBINUR NEGATIVE 01/01/2020 2133   KETONESUR 20 (A) 01/01/2020 2133   PROTEINUR 30 (A) 01/01/2020 2133   NITRITE NEGATIVE 01/01/2020 2133   LEUKOCYTESUR LARGE (A) 01/01/2020 2133   Sepsis Labs: @LABRCNTIP (procalcitonin:4,lacticidven:4) ) Recent Results (from the past 240 hour(s))  Respiratory Panel by RT PCR (Flu A&B, Covid) - Nasopharyngeal Swab     Status: None   Collection Time: 01/01/20  4:30 PM   Specimen: Nasopharyngeal Swab  Result Value Ref Range Status   SARS Coronavirus 2 by RT PCR NEGATIVE NEGATIVE Final    Comment: (NOTE) SARS-CoV-2 target nucleic acids are NOT DETECTED.  The SARS-CoV-2 RNA is generally detectable in upper respiratoy specimens during the acute phase of infection. The lowest concentration of SARS-CoV-2 viral copies this assay can detect is 131 copies/mL. A negative result does not preclude SARS-Cov-2 infection and should not be used as the sole basis for treatment or other patient management decisions. A negative result may occur with  improper specimen collection/handling, submission of specimen other than nasopharyngeal swab, presence of viral mutation(s) within the areas targeted by this assay, and inadequate number of viral copies (<131 copies/mL). A negative result must be combined with clinical observations, patient history, and epidemiological information. The expected result is Negative.  Fact Sheet for Patients:  https://www.moore.com/https://www.fda.gov/media/142436/download  Fact Sheet for Healthcare Providers:  https://www.young.biz/https://www.fda.gov/media/142435/download  This test is no t yet approved or cleared by the [place] FDA and  has been authorized for detection and/or diagnosis of SARS-CoV-2 by FDA under an Emergency Use Authorization (EUA). This EUA will remain  in effect (meaning this test can be used) for the duration of the COVID-19 declaration under Section 564(b)(1) of the Act, 21 U.S.C. section 360bbb-3(b)(1), unless the authorization is terminated  or revoked sooner.     Influenza A by PCR NEGATIVE NEGATIVE Final   Influenza B by PCR NEGATIVE NEGATIVE Final    Comment: (NOTE) The Xpert Xpress SARS-CoV-2/FLU/RSV assay is intended as an aid in  the diagnosis of influenza from Nasopharyngeal swab specimens and  should not be used as a sole basis for treatment. Nasal washings and  aspirates are unacceptable for Xpert Xpress SARS-CoV-2/FLU/RSV  testing.  Fact Sheet for Patients: https://www.moore.com/https://www.fda.gov/media/142436/download  Fact Sheet for Healthcare Providers: https://www.young.biz/https://www.fda.gov/media/142435/download  This test is not yet approved or cleared by the [place] FDA and  has been authorized for detection and/or diagnosis of SARS-CoV-2 by  FDA under an Emergency Use Authorization (EUA). This EUA will remain  in effect (meaning this test can be used) for the duration of the  Covid-19 declaration under Section 564(b)(1) of the Act, 21  U.S.C. section 360bbb-3(b)(1), unless the authorization is  terminated or revoked. Performed at [facility], [address] [place], [place] [address]   Culture, Urine     Status: None   Collection Time: 01/03/20  3:35 PM   Specimen: Urine, Clean Catch  Result Value Ref Range Status   Specimen Description   Final    URINE, CLEAN CATCH Performed at [facility], [address] [place], [place] [address]    Special Requests   Final    NONE Performed at [facility], [address] [place], [place]  [address]    Culture   Final    NO GROWTH Performed at [facility], [address] [place], [place] [address]    Report Status 01/05/2020 FINAL  Final     Scheduled Meds: . ARIPiprazole  5 mg Oral BID  . cefdinir  300 mg Oral Q12H  . diazepam  5 mg Oral Q12H  . enoxaparin (LOVENOX) injection  40 mg Subcutaneous Q24H  . feeding supplement  237 mL Oral BID BM  . ferrous sulfate  325 mg Oral BID WC  . folic acid  1 mg Oral Daily  . lamoTRIgine  100 mg Oral Daily  . polyethylene glycol   17 g Oral Daily  . QUEtiapine  150 mg Per Tube BID  . senna  2 tablet Oral Daily   Continuous Infusions: . dextrose 5 % and 0.9% NaCl 100 mL/hr at 01/02/20 9798    Procedures/Studies: CT Head Wo Contrast  Result Date: 01/01/2020 CLINICAL DATA:  Altered mental status. EXAM: CT HEAD WITHOUT CONTRAST TECHNIQUE: Contiguous axial images were obtained from the base of the skull through the vertex without intravenous contrast. COMPARISON:  December 28, 2019 FINDINGS: Brain: There is mild cerebral atrophy with widening of the extra-axial spaces and ventricular dilatation. There are areas of decreased attenuation within the white matter tracts of the supratentorial brain, consistent with microvascular disease changes. Vascular: No hyperdense vessel or unexpected calcification. Skull: Normal. Negative for fracture or focal lesion. Sinuses/Orbits: A 1.2 cm x 1.1 cm left maxillary sinus polyp versus mucous retention cyst is seen. Other: None. IMPRESSION: 1. Generalized cerebral atrophy. 2. No acute intracranial abnormality. 3. Left maxillary sinus polyp versus mucous retention cyst. Electronically Signed   By: Aram Candela M.D.   On: 01/01/2020 21:33   CT Angio Chest PE W/Cm &/Or Wo Cm  Result Date: 01/01/2020 CLINICAL DATA:  Abnormal chest x-ray and tachycardia EXAM: CT ANGIOGRAPHY CHEST WITH CONTRAST TECHNIQUE: Multidetector CT imaging of the chest was performed using the standard protocol during bolus administration of intravenous contrast. Multiplanar CT image reconstructions and MIPs were obtained to evaluate the vascular anatomy. CONTRAST:  OMNIPAQUE IOHEXOL 350 MG/ML SOLN COMPARISON:  Radiograph same day FINDINGS: Cardiovascular: There is a optimal opacification of the pulmonary arteries. There is no central,segmental, or subsegmental filling defects within the pulmonary arteries. The heart is normal in size. No pericardial effusion or thickening. No evidence right heart strain. There is normal  three-vessel brachiocephalic anatomy without proximal stenosis. The thoracic aorta is normal in appearance. Mediastinum/Nodes: No hilar, mediastinal, or axillary adenopathy. Thyroid gland, trachea, and esophagus demonstrate no significant findings. Lungs/Pleura: The lungs are clear. No pleural effusion or pneumothorax. No airspace consolidation. Upper Abdomen: No acute abnormalities present in the visualized portions of the upper abdomen. Percutaneous gastrotomy tube seen within the mid body of the stomach. Musculoskeletal: No chest wall abnormality. No acute or significant osseous findings. Review of the MIP images confirms the above findings. IMPRESSION: No central, segmental, or subsegmental pulmonary embolism. No other acute intrathoracic pathology to explain the patient's symptoms. Electronically Signed   By: Jonna Clark M.D.   On: 01/01/2020 21:33   DG Chest Portable 1 View  Result Date: 01/01/2020 CLINICAL DATA:  Increased agitation for 4 days, history of brain injury EXAM: PORTABLE CHEST 1 VIEW COMPARISON:  Portable exam 1550 hours compared to 12/28/2019 and 09/28/2019 FINDINGS: Normal heart size, mediastinal contours, and pulmonary vascularity. New somewhat focal but ill-defined opacity in the RIGHT mid lung question pneumonia versus  superimposed artifact. Remaining lungs clear. No pleural effusion or pneumothorax. Osseous structures unremarkable. IMPRESSION: New somewhat focal but ill-defined opacity in RIGHT mid lung question pneumonia; this was not seen on the prior study of 12/28/2019 nor on an earlier exam from 09/28/2019. Radiographic follow-up until resolution recommended to exclude pulmonary nodule. Electronically Signed   By: Ulyses Southward M.D.   On: 01/01/2020 16:05    Catarina Hartshorn, DO  [facility]  If 7PM-7AM, please contact night-coverage [email or web address] Password [email or web address] 01/10/2020, 6:12 PM   LOS: 9 days

## 2020-01-11 DIAGNOSIS — I471 Supraventricular tachycardia: Secondary | ICD-10-CM

## 2020-01-11 MED ORDER — METOPROLOL TARTRATE 25 MG PO TABS
12.5000 mg | ORAL_TABLET | Freq: Two times a day (BID) | ORAL | Status: DC
  Administered 2020-01-11 – 2020-02-04 (×46): 12.5 mg via ORAL
  Filled 2020-01-11 (×48): qty 1

## 2020-01-11 NOTE — Progress Notes (Addendum)
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 He had elevated troponins over 5000.Due to combativeness, he required sedation and subsequent intubation [facility]. He was started on ceftriaxone. Due to possible seizure, he was started on Keppra and placed on continuous EEG which was negative for seizure activity. 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 He received a 5 day course of SoluMedrol for cerebral edema. He then developed exteensor posturing with tachycardia and hypertension concerning for elevated intracranial pressure. He underwent Bolt ICP monitoring which demonstrated no elevated ICP. Follow up CT head and subsequent repeat MRI of brain showed small subacute hemorrhage in the right suboccipital lobe, either hemorrhagic conversion of small ischemic stroke vs extension of previously seen microhemorrhage, as well as hypoxic injury involving the bilateral caudate and putamen. He also sustained a NSTEMI. He was extubated on 10/10/2019 and underwent slow taper of Dilaudid to minimize withdrawal symptoms. A G-tube was inserted on 10/16/2019 and he was discharged on 10/18/2019 under the care of his mother who is a CNA.   He has been going to outpatient PT/OT and has been getting stronger. He is still taking food and medication via G-tube. He has not been able to establish care with a PCP yet. He  is easily angry and has kicked his mother. He does not talk much and often when he speaks, it may be a curse word. He is still incontinent. He required assistance with essential ADLs such as bathing, dressing and using toilet.  Assessment/Plan: Acuteon chronicmetabolic encephalopathy--- worsening behavioral problems superimposed on underlying anoxic brain injury -Added Abilify 5 mg twice daily -Continue Lamictal 100 mg daily -Keppra was recently discontinued as patient's EEG was nonepileptiformand may have been contributing to increased agitation --Lorazepam or Haldol as needed agitation -continue to gradually titrate up seroquel -I have discussed the risks/benefits of seroquel including its "black box warning" with patient's mother  -she expresses understanding in light of the patient's behavioral issues -She expresses that other medications and change of venue/scenery have been tried to try to provide care for the patient -in light of her inability to care for the patient and failure of other modalities, she accepts the risks with seroquel in hopes that his behavior can be controlled to allow for care of the patient -I suspect that his overall mental status with periods of agitation ishis usual baseline at home -continuevalium bid -11/5 increased seroquel to 150 mg bid -11/6 overall less aggression but continues to have episodes of agitation -01/11/20--restraints off for ~24 hours  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 -He is easily angry, gets agitated and may kick and punch. He does not talk much. He is still incontinent. He requires assistance with essential ADLs such as bathing, dressing and using toilet -Familydoes not feel that they can safely manage the patient at home and arerequesting placement to facility -Social work consult to help with placement requested  chronic anemia/iron deficiency anemia -multifactorial , partly nutritional, expect further drop in H&H with hydration/hemodilution -iron sat--9% -folate 7.1 -B12--2310 -started po iron and folate  presumed UTI --patient does not meet sepsis criteria -treatedempirically with IV Rocephinx 3 days  Disposition/Need for in-Hospital Stay- patient unable to be discharged at this time due to -behavioral problems requiring medication adjustments, --At this time patient does not have a safe discharge plan, patient's mother unable to manage him at home, needs SNF facility  Status is: Inpatient  Remains inpatient appropriate because:behavioral problems requiring medication adjustments     Status is: Inpatient  Remains inpatient appropriate because:Unsafe d/c plan   Dispo: The patient is from:Home Anticipated d/c is to:SNF Anticipated d/c date is:3days Patient currently is not medically stable to d/c.        Family Communication:Fiance updated 11/7  Consultants:none  Code Status: FULL   DVT Prophylaxis: Barry Lovenox   Procedures: As Listed in Progress Note Above  Antibiotics: None    Subjective: Patient denies cp, sob, abd pain, n/v/d  Objective: Vitals:   01/10/20 1350 01/10/20 2011 01/11/20 0606 01/11/20 1347  BP: 127/75 117/79 128/87 122/77  Pulse: (!) 108 91 87 (!)  101  Resp: 17 18 18 16   Temp: 98.8 F (37.1 C) 97.9 F (36.6 C) 97.7 F (36.5 C)   TempSrc: Axillary     SpO2: 100% 100% 100% 100%  Weight:      Height:       No intake or output data in the 24 hours ending 01/11/20 1658 Weight change:  Exam:   General:  Pt is alert, follows commands appropriately, not in acute distress  HEENT: No icterus, No thrush, No neck mass, [place]/AT  Cardiovascular: RRR, S1/S2, no rubs, no gallops  Respiratory: CTA bilaterally, no wheezing, no crackles, no rhonchi  Abdomen: Soft/+BS, non tender, non distended, no guarding  Extremities: No edema, No lymphangitis, No petechiae, No rashes, no synovitis   Data Reviewed: I have personally reviewed following labs and imaging studies Basic Metabolic Panel: Recent Labs  Lab 01/08/20 1611  NA 137  K 3.7  CL 101  CO2 27  GLUCOSE 121*  BUN 11  CREATININE 0.90  CALCIUM 8.8*   Liver Function Tests: Recent Labs  Lab 01/08/20 1611  AST 18  ALT 14  ALKPHOS 49  BILITOT 0.4  PROT 6.8  ALBUMIN 3.9   No results for input(s): LIPASE, AMYLASE in the last 168 hours. No results for input(s): AMMONIA in the last 168 hours. Coagulation Profile: No results for input(s): INR, PROTIME in the last 168 hours. CBC: Recent Labs  Lab 01/08/20 1611  WBC 6.0  HGB 10.7*  HCT 33.4*  MCV 91.3  PLT 372   Cardiac Enzymes: No results for input(s): CKTOTAL, CKMB, CKMBINDEX, TROPONINI in the last 168 hours. BNP: Invalid input(s): POCBNP CBG: No results for input(s): GLUCAP in the last 168 hours. HbA1C: No results for input(s): HGBA1C in the last 72 hours. Urine analysis:    Component Value Date/Time   COLORURINE YELLOW  01/01/2020 2133   APPEARANCEUR HAZY (A) 01/01/2020 2133   LABSPEC 1.003 (L) 01/01/2020 2133   PHURINE 6.0 01/01/2020 2133   GLUCOSEU NEGATIVE 01/01/2020 2133   HGBUR LARGE (A) 01/01/2020 2133   BILIRUBINUR NEGATIVE 01/01/2020 2133   KETONESUR 20 (A) 01/01/2020 2133   PROTEINUR 30 (A)  01/01/2020 2133   NITRITE NEGATIVE 01/01/2020 2133   LEUKOCYTESUR LARGE (A) 01/01/2020 2133   Sepsis Labs: @LABRCNTIP (procalcitonin:4,lacticidven:4) ) Recent Results (from the past 240 hour(s))  Culture, Urine     Status: None   Collection Time: 01/03/20  3:35 PM   Specimen: Urine, Clean Catch  Result Value Ref Range Status   Specimen Description   Final    URINE, CLEAN CATCH Performed at [facility], [address] [place], [place]    Special Requests   Final    NONE Performed at [facility], [address], [place], [place]    Culture   Final    NO GROWTH Performed at [facility], [address] [place], [place]    Report Status 01/05/2020 FINAL  Final     Scheduled Meds: . ARIPiprazole  5 mg Oral BID  . cefdinir  300 mg Oral Q12H  . diazepam  5 mg Oral Q12H  . enoxaparin (LOVENOX) injection  40 mg Subcutaneous Q24H  . feeding supplement  237 mL Oral BID BM  . ferrous sulfate  325 mg Oral BID WC  . folic acid  1 mg Oral Daily  . lamoTRIgine  100 mg Oral Daily  . polyethylene glycol  17 g Oral Daily  . QUEtiapine  150 mg Per Tube BID  . senna  2 tablet Oral Daily   Continuous Infusions: . dextrose 5 % and 0.9% NaCl 100 mL/hr at 01/02/20 01/04/20    Procedures/Studies: CT Head Wo Contrast  Result Date: 01/01/2020 CLINICAL DATA:  Altered mental status. EXAM: CT HEAD WITHOUT CONTRAST TECHNIQUE: Contiguous axial images were obtained from the base of the skull through the vertex without intravenous contrast. COMPARISON:  December 28, 2019 FINDINGS: Brain: There is mild cerebral atrophy with widening of the extra-axial spaces and ventricular dilatation. There are areas of decreased attenuation within the white matter tracts of the supratentorial brain, consistent with microvascular disease changes. Vascular: No hyperdense vessel or unexpected calcification. Skull: Normal. Negative for fracture or focal lesion. Sinuses/Orbits: A 1.2 cm x 1.1  cm left maxillary sinus polyp versus mucous retention cyst is seen. Other: None. IMPRESSION: 1. Generalized cerebral atrophy. 2. No acute intracranial abnormality. 3. Left maxillary sinus polyp versus mucous retention cyst. Electronically Signed   By: December 30, 2019 M.D.   On: 01/01/2020 21:33   CT Angio Chest PE W/Cm &/Or Wo Cm  Result Date: 01/01/2020 CLINICAL DATA:  Abnormal chest x-ray and tachycardia EXAM: CT ANGIOGRAPHY CHEST WITH CONTRAST TECHNIQUE: Multidetector CT imaging of the chest was performed using the standard protocol during bolus administration of intravenous contrast. Multiplanar CT image reconstructions and MIPs were obtained to evaluate the vascular anatomy. CONTRAST:  01/03/2020 OMNIPAQUE IOHEXOL 350 MG/ML SOLN COMPARISON:  Radiograph same day FINDINGS: Cardiovascular: There is a optimal opacification of the pulmonary arteries. There is no central,segmental, or subsegmental filling defects within the pulmonary arteries. The heart is normal in size. No pericardial effusion or thickening. No evidence right heart strain. There is normal three-vessel brachiocephalic anatomy without proximal stenosis. The thoracic aorta is normal in appearance. Mediastinum/Nodes: No hilar, mediastinal, or axillary adenopathy. Thyroid gland, trachea, and esophagus  demonstrate no significant findings. Lungs/Pleura: The lungs are clear. No pleural effusion or pneumothorax. No airspace consolidation. Upper Abdomen: No acute abnormalities present in the visualized portions of the upper abdomen. Percutaneous gastrotomy tube seen within the mid body of the stomach. Musculoskeletal: No chest wall abnormality. No acute or significant osseous findings. Review of the MIP images confirms the above findings. IMPRESSION: No central, segmental, or subsegmental pulmonary embolism. No other acute intrathoracic pathology to explain the patient's symptoms. Electronically Signed   By: Jonna Clark M.D.   On: 01/01/2020 21:33   DG  Chest Portable 1 View  Result Date: 01/01/2020 CLINICAL DATA:  Increased agitation for 4 days, history of brain injury EXAM: PORTABLE CHEST 1 VIEW COMPARISON:  Portable exam 1550 hours compared to 12/28/2019 and 09/28/2019 FINDINGS: Normal heart size, mediastinal contours, and pulmonary vascularity. New somewhat focal but ill-defined opacity in the RIGHT mid lung question pneumonia versus superimposed artifact. Remaining lungs clear. No pleural effusion or pneumothorax. Osseous structures unremarkable. IMPRESSION: New somewhat focal but ill-defined opacity in RIGHT mid lung question pneumonia; this was not seen on the prior study of 12/28/2019 nor on an earlier exam from 09/28/2019. Radiographic follow-up until resolution recommended to exclude pulmonary nodule. Electronically Signed   By: Ulyses Southward M.D.   On: 01/01/2020 16:05    Catarina Hartshorn, DO  [facility]  If 7PM-7AM, please contact night-coverage [email or web address] Password [email or web address] 01/11/2020, 4:58 PM   LOS: 10 days

## 2020-01-11 NOTE — Progress Notes (Signed)
Calorie Count Note  48 hour calorie count results  RD working remotely.  RD spoke with patient's RN via phone on 11/6 to obtain percent intake of meals for day 2 of calorie count. Nurse reports 2 meal tickets in envelope, however no intakes were documented. No intake of breakfast at time of call, reported he was still sleeping. RN reports patient is enjoying Ensure supplements, drinks 100%. RD is unable to provide completed results, however, highly likely that patient met >/= 90% of needs on day 2 given excellent oral intake results from first 24 hours.   Day 1Total intake: (please see RD note on 11/5 for detailed meal intake)  -2895 kcal (124% of estimated kcal needs)  140 grams protein (122% of estimated protein needs)  Day 2 Total Intake: (incomplete)  11/5-Lunch: 445 kcal, 18 gr protein, 240 cc tea 11/5- Ensure Enlive BID: 700 kcal, 40 grams of protein  Nutrition Dx: Inadequate oral intakerelated to inability to eatas evidenced by (PEG tube dependent for nutrition/hydration and medications s/p multiple subacute strokes with residual weakness secondary to recent bacterial meningitis).  -Resolved, pt has progressed with diet advancement with assistance of ST and successfully tolerating oral nutrition on regular diet.  Goal: Pt to meet >/= 90% of their estimated nutrition needs  -Met, continue with current goal  Intervention:  -d/c calorie count -Continue Ensure Enlive BID -Continue Magic Cup with meals -Continue feeding assistance with meals, provided by nursing staff -Continue to encourage po intake of meals and supplements  Lajuan Lines, RD, LDN Clinical Nutrition After Hours/Weekend Pager # in [place]

## 2020-01-12 NOTE — TOC Progression Note (Addendum)
Transition of Care [facility]) - Progression Note    Patient Details  Name: Isaac [name] MRN: [id number] Date of Birth: December 05, 1980  Transition of [facility] CM/SW Contact  Karn Cassis, [place] Phone Number: 01/12/2020, 1:30 PM  Clinical Narrative:   Discussed pt with supervisor. Referral made to [facility]. Admissions is reviewing and will follow up with TOC. LCSW also contact Reyne Dumas with [facility]. Tammy states that pt is not a good fit for [facility] at this time due to behaviors. TOC will continue to work on d/c planning.   Update: [place] is unable to make bed offer at this time.       Barriers to Discharge: Continued Medical Work up, SNF Pending bed offer, Inadequate or no insurance  Expected Discharge Plan and Services                                                 Social Determinants of Health (SDOH) Interventions    Readmission Risk Interventions No flowsheet data found.

## 2020-01-12 NOTE — Progress Notes (Signed)
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 He had elevated troponins over 5000.Due to combativeness, he required sedation and subsequent intubation [facility] with [facility]. He was started on ceftriaxone. Due to possible seizure, he was started on Keppra and placed on continuous EEG which was negative for seizure activity. 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 He received a 5 day course of SoluMedrol for cerebral edema. He then developed exteensor posturing with tachycardia and hypertension concerning for elevated intracranial pressure. He underwent Bolt ICP monitoring which demonstrated no elevated ICP. Follow up CT head and subsequent repeat MRI of brain showed small subacute hemorrhage in the right suboccipital lobe, either hemorrhagic conversion of small ischemic stroke vs extension of previously seen microhemorrhage, as well as hypoxic injury involving the bilateral caudate and putamen. He also sustained a NSTEMI. He was extubated on 10/10/2019 and underwent slow taper of Dilaudid to minimize withdrawal symptoms. A G-tube was inserted on 10/16/2019 and he was discharged on 10/18/2019 under the care of his mother who is a CNA.   He has been going to outpatient PT/OT and has been getting stronger. He is still taking food and medication via G-tube. He has not been able to establish care with a PCP yet. He is  easily angry and has kicked his mother. He does not talk much and often when he speaks, it may be a curse word. He is still incontinent. He required assistance with essential ADLs such as bathing, dressing and using toilet.  Assessment/Plan: Acuteon chronicmetabolic encephalopathy--- worsening behavioral problems superimposed on underlying anoxic brain injury -AddedAbilify 5 mg twice daily -Continue Lamictal 100 mg daily -Keppra was recently discontinued as patient's EEG was nonepileptiformand may have been contributing to increased agitation --Lorazepam or Haldol as needed agitation -continue to gradually titrate up seroquel -I have discussed the risks/benefits of seroquel including its "black box warning" with patient's mother  -she expresses understanding in light of the patient's behavioral issues -She expresses that other medications and change of venue/scenery have been tried to try to provide care for the patient -in light of her inability to care for the patient and failure of other modalities, she accepts the risks with seroquel in hopes that his behavior can be controlled to allow for care of the patient -I suspect that his overall mental status with periods of agitation ishis usual baseline at home -continuevalium bid -11/5 increased seroquel to 150 mg bid -11/6 overall less aggression but continues to have episodes of agitation -01/11/20--restraints off for ~24 hours>>48  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 -He is easily angry, gets agitated and may kick and punch. He does not talk much. He is still incontinent. He requires assistance with essential ADLs such as bathing, dressing and using toilet -Familydoes not feel that they can safely manage the patient at home and arerequesting placement to facility -Social work consult to help with placement requested  chronic anemia/iron deficiency anemia -multifactorial , partly nutritional, expect further drop in H&H with hydration/hemodilution -iron sat--9% -folate 7.1 -B12--2310 -startedpo iron and folate  presumed UTI --patient does not meet sepsis criteria -treatedempirically with IV Rocephinx 3 days  Disposition/Need for in-Hospital Stay- patient unable to be discharged at this time due to -behavioral problems requiring medication adjustments, --At this time patient does not have a safe discharge plan, patient's mother unable to manage him at home, needs SNF facility  Status is: Inpatient  Remains inpatient appropriate because:behavioral problems requiring medication adjustments     Status is: Inpatient  Remains inpatient appropriate because:Unsafe d/c plan   Dispo: The patient is from:Home Anticipated d/c is to:SNF Anticipated d/c date is:3days Patient currently is medically stable to d/c.        Family Communication:Fiance updated 11/7  Consultants:none  Code Status: FULL   DVT Prophylaxis: [place] Lovenox   Procedures: As Listed in Progress Note Above  Antibiotics: None     Subjective: Patient denies fevers, chills, headache, chest pain, dyspnea, nausea, vomiting, diarrhea, abdominal pain,  Pleasantly confused  Objective: Vitals:   01/11/20 1347 01/11/20 2128  01/12/20 0436 01/12/20 0942  BP: 122/77 104/68 107/75 117/70  Pulse: (!) 101 99 84 99  Resp: 16 20 20    Temp:  98 F (36.7 C) 97.8 F (36.6 C)   TempSrc:  Oral Oral   SpO2: 100% 100% 100%   Weight:      Height:        Intake/Output Summary (Last 24 hours) at 01/12/2020 1731 Last data filed at 01/12/2020 1331 Gross per 24 hour  Intake 1440 ml  Output --  Net 1440 ml   Weight change:  Exam:   General:  Pt is alert, follows commands appropriately, not in acute distress  HEENT: No icterus, No thrush, No neck mass, [place]/AT  Cardiovascular: RRR, S1/S2, no rubs, no gallops  Respiratory: CTA bilaterally, no wheezing, no crackles, no rhonchi  Abdomen: Soft/+BS, non tender, non distended, no guarding  Extremities: No edema, No lymphangitis, No petechiae, No rashes, no synovitis   Data Reviewed: I have personally reviewed following labs and imaging studies Basic Metabolic Panel: Recent Labs  Lab 01/08/20 1611  NA 137  K 3.7  CL 101  CO2 27  GLUCOSE 121*  BUN 11  CREATININE 0.90  CALCIUM 8.8*   Liver Function Tests: Recent Labs  Lab 01/08/20 1611  AST 18  ALT 14  ALKPHOS 49  BILITOT 0.4  PROT 6.8  ALBUMIN 3.9   No results for input(s): LIPASE, AMYLASE in the last 168 hours. No results for input(s): AMMONIA in the last 168 hours. Coagulation Profile: No results for input(s): INR, PROTIME in the last 168 hours. CBC: Recent Labs  Lab 01/08/20 1611  WBC 6.0  HGB 10.7*  HCT 33.4*  MCV 91.3  PLT 372   Cardiac Enzymes: No results for input(s): CKTOTAL, CKMB, CKMBINDEX, TROPONINI in the last 168 hours. BNP: Invalid input(s): POCBNP CBG: No results for input(s): GLUCAP in the last 168 hours. HbA1C:  No results for input(s): HGBA1C in the last 72 hours. Urine analysis:    Component Value Date/Time   COLORURINE YELLOW 01/01/2020 2133   APPEARANCEUR HAZY (A) 01/01/2020 2133   LABSPEC 1.003 (L) 01/01/2020 2133   PHURINE 6.0 01/01/2020 2133   GLUCOSEU  NEGATIVE 01/01/2020 2133   HGBUR LARGE (A) 01/01/2020 2133   BILIRUBINUR NEGATIVE 01/01/2020 2133   KETONESUR 20 (A) 01/01/2020 2133   PROTEINUR 30 (A) 01/01/2020 2133   NITRITE NEGATIVE 01/01/2020 2133   LEUKOCYTESUR LARGE (A) 01/01/2020 2133   Sepsis Labs: @LABRCNTIP (procalcitonin:4,lacticidven:4) ) Recent Results (from the past 240 hour(s))  Culture, Urine     Status: None   Collection Time: 01/03/20  3:35 PM   Specimen: Urine, Clean Catch  Result Value Ref Range Status   Specimen Description   Final    URINE, CLEAN CATCH Performed at [facility], [address] [place], [place] [address]    Special Requests   Final    NONE Performed at [facility], [address] [place], [place] [address]    Culture   Final    NO GROWTH Performed at [facility], [address] [place], [place] [address]    Report Status 01/05/2020 FINAL  Final     Scheduled Meds: . ARIPiprazole  5 mg Oral BID  . cefdinir  300 mg Oral Q12H  . diazepam  5 mg Oral Q12H  . enoxaparin (LOVENOX) injection  40 mg Subcutaneous Q24H  . feeding supplement  237 mL Oral BID BM  . ferrous sulfate  325 mg Oral BID WC  . folic acid  1 mg Oral Daily  . lamoTRIgine  100 mg Oral Daily  . metoprolol tartrate  12.5 mg Oral BID  . polyethylene glycol  17 g Oral Daily  . QUEtiapine  150 mg Per Tube BID  . senna  2 tablet Oral Daily   Continuous Infusions: . dextrose 5 % and 0.9% NaCl 100 mL/hr at 01/02/20 [id number]    Procedures/Studies: CT Head Wo Contrast  Result Date: 01/01/2020 CLINICAL DATA:  Altered mental status. EXAM: CT HEAD WITHOUT CONTRAST TECHNIQUE: Contiguous axial images were obtained from the base of the skull through the vertex without intravenous contrast. COMPARISON:  December 28, 2019 FINDINGS: Brain: There is mild cerebral atrophy with widening of the extra-axial spaces and ventricular dilatation. There are areas of decreased attenuation within the white matter tracts of the supratentorial  brain, consistent with microvascular disease changes. Vascular: No hyperdense vessel or unexpected calcification. Skull: Normal. Negative for fracture or focal lesion. Sinuses/Orbits: A 1.2 cm x 1.1 cm left maxillary sinus polyp versus mucous retention cyst is seen. Other: None. IMPRESSION: 1. Generalized cerebral atrophy. 2. No acute intracranial abnormality. 3. Left maxillary sinus polyp versus mucous retention cyst. Electronically Signed   By: Aram Candelahaddeus  Houston M.D.   On: 01/01/2020 21:33   CT Angio Chest PE W/Cm &/Or Wo Cm  Result Date: 01/01/2020 CLINICAL DATA:  Abnormal chest x-ray and tachycardia EXAM: CT ANGIOGRAPHY CHEST WITH CONTRAST TECHNIQUE: Multidetector CT imaging of the chest was performed using the standard protocol during bolus administration of intravenous contrast. Multiplanar CT image reconstructions and MIPs were obtained to evaluate the vascular anatomy. CONTRAST:  100mL OMNIPAQUE IOHEXOL 350 MG/ML SOLN COMPARISON:  Radiograph same day FINDINGS: Cardiovascular: There is a optimal opacification of the pulmonary arteries. There is no central,segmental, or subsegmental filling defects within the pulmonary arteries. The heart is normal in size. No pericardial effusion or thickening. No evidence  right heart strain. There is normal three-vessel brachiocephalic anatomy without proximal stenosis. The thoracic aorta is normal in appearance. Mediastinum/Nodes: No hilar, mediastinal, or axillary adenopathy. Thyroid gland, trachea, and esophagus demonstrate no significant findings. Lungs/Pleura: The lungs are clear. No pleural effusion or pneumothorax. No airspace consolidation. Upper Abdomen: No acute abnormalities present in the visualized portions of the upper abdomen. Percutaneous gastrotomy tube seen within the mid body of the stomach. Musculoskeletal: No chest wall abnormality. No acute or significant osseous findings. Review of the MIP images confirms the above findings. IMPRESSION: No  central, segmental, or subsegmental pulmonary embolism. No other acute intrathoracic pathology to explain the patient's symptoms. Electronically Signed   By: Jonna Clark M.D.   On: 01/01/2020 21:33   DG Chest Portable 1 View  Result Date: 01/01/2020 CLINICAL DATA:  Increased agitation for 4 days, history of brain injury EXAM: PORTABLE CHEST 1 VIEW COMPARISON:  Portable exam 1550 hours compared to 12/28/2019 and 09/28/2019 FINDINGS: Normal heart size, mediastinal contours, and pulmonary vascularity. New somewhat focal but ill-defined opacity in the RIGHT mid lung question pneumonia versus superimposed artifact. Remaining lungs clear. No pleural effusion or pneumothorax. Osseous structures unremarkable. IMPRESSION: New somewhat focal but ill-defined opacity in RIGHT mid lung question pneumonia; this was not seen on the prior study of 12/28/2019 nor on an earlier exam from 09/28/2019. Radiographic follow-up until resolution recommended to exclude pulmonary nodule. Electronically Signed   By: Ulyses Southward M.D.   On: 01/01/2020 16:05    Catarina Hartshorn, DO  [facility]  If 7PM-7AM, please contact night-coverage [email or web address] Password [email or web address] 01/12/2020, 5:31 PM   LOS: 11 days

## 2020-01-13 NOTE — Progress Notes (Signed)
   01/11/20 1300  What Happened  Was fall witnessed? No  Was patient injured? No  Patient found on floor  Found by Staff-comment  Stated prior activity other (comment) (in bed)  Follow Up  MD notified Dr. Arbutus Leas  Time MD notified 1240  Family notified Yes - comment  Time family notified 1240  Adult Fall Risk Assessment  Risk Factor Category (scoring not indicated) High fall risk per protocol (document High fall risk)  Patient Fall Risk Level High fall risk  Adult Fall Risk Interventions  Required Bundle Interventions *See Row Information* High fall risk - low, moderate, and high requirements implemented  Additional Interventions Room near nurses station  Screening for Fall Injury Risk (To be completed on HIGH fall risk patients) - Assessing Need for Low Bed  Risk For Fall Injury- Low Bed Criteria None identified - Continue screening  Screening for Fall Injury Risk (To be completed on HIGH fall risk patients who do not meet crieteria for Low Bed) - Assessing Need for Floor Mats Only  Risk For Fall Injury- Criteria for Floor Mats Confusion/dementia (+NuDESC, CIWA, TBI, etc.)  Vitals  Temp 98.6 F (37 C)  Temp Source Oral  BP 122/70  Patient Position (if appropriate) Sitting  Resp 16  Oxygen Therapy  SpO2 100 %  O2 Device Room Air  Pain Assessment  Pain Score 0  Neurological  Neuro (WDL) X  Level of Consciousness Alert  Orientation Level Oriented to person  Neuro Symptoms Anxiety;Forgetful  Musculoskeletal  Generalized Weakness Yes  Integumentary  Integumentary (WDL) WDL

## 2020-01-13 NOTE — Progress Notes (Signed)
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 He had elevated troponins over 5000.Due to combativeness, he required sedation and subsequent intubation [facility] with pneum[facility]. He was started on ceftriaxone. Due to possible seizure, he was started on Keppra and placed on continuous EEG which was negative for seizure activity. 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 He received a 5 day course of SoluMedrol for cerebral edema. He then developed exteensor posturing with tachycardia and hypertension concerning for elevated intracranial pressure. He underwent Bolt ICP monitoring which demonstrated no elevated ICP. Follow up CT head and subsequent repeat MRI of brain showed small subacute hemorrhage in the right suboccipital lobe, either hemorrhagic conversion of small ischemic stroke vs extension of previously seen microhemorrhage, as well as hypoxic injury involving the bilateral caudate and putamen. He also sustained a NSTEMI. He was extubated on 10/10/2019 and underwent slow taper of Dilaudid to minimize withdrawal symptoms. A G-tube was inserted on 10/16/2019 and he was discharged on 10/18/2019 under the care of his mother who is a CNA.   He has been going to outpatient PT/OT and has been getting stronger. He is still taking food and medication via G-tube. He has not been able to establish care with a PCP yet. He  is easily angry and has kicked his mother. He does not talk much and often when he speaks, it may be a curse word. He is still incontinent. He requiredassistance with essential ADLs such as bathing, dressing and using toilet.  Assessment/Plan: Acuteon chronicmetabolic encephalopathy--- worsening behavioral problems superimposed on underlying anoxic brain injury -AddedAbilify 5 mg twice daily -Continue Lamictal 100 mg daily -Keppra was recently discontinued as patient's EEG was nonepileptiformand may have been contributing to increased agitation --Lorazepam or Haldol as needed agitation -continue to gradually titrate up seroquel -I have discussed the risks/benefits of seroquel including its "black box warning" with patient's mother  -she expresses understanding in light of the patient's behavioral issues -She expresses that other medications and change of venue/scenery have been tried to try to provide care for the patient -in light of her inability to care for the patient and failure of other modalities, she accepts the risks with seroquel in hopes that his behavior can be controlled to allow for care of the patient -I suspect that his overall mental status with periods of agitation ishis usual baseline at home -continuevalium bid -11/5 increased seroquel to 150 mg bid -11/6 overall less aggression but continues to have episodes of agitation -01/11/20--restraints off for ~24 hours>>72 -overall now more re-directable and following commands; no further aggressive behavior after adjustment of medications  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-- -patient with behavior,neurological and intellectual deficits--medication adjustments as above #1  Dysphagia/FEN -Seen by speech therapy and started on dysphagia 2 dietwith thin liquids -11/4--discussed with speech therapy--re-evaluated patient today--can likely upgrade diet -11/5>>>eating nearly 100% meals with  assist -continues to eat well--100% meals -consult general surgery to remove PEG  SVT -patient has intermittent episodes of SVT -start metoprolol low dose  Social/Ethics -patient's mother was a trained CNA is no longer able to manage him at home due to escalating behaviors --Patient remains total care- He is still taking food and medication via G-tube. -He is easily angry, gets agitated and may kick and punch. He does not talk much. He is still incontinent. He requires assistance with essential ADLs such as bathing, dressing and using toilet -Familydoes not feel that they can safely manage the patient at home and arerequesting placement to facility -Social work consult to help with placement requested  chronic anemia/iron deficiency anemia -multifactorial , partly nutritional, expect further drop in H&H with hydration/hemodilution -iron sat--9% -folate 7.1 -B12--2310 -startedpo iron and folate  presumed UTI --patient does not meet sepsis criteria -treatedempirically with IV Rocephinx 3 days  Disposition/Need for in-Hospital Stay- patient unable to be discharged at this time due to -behavioral problems requiring medication adjustments, --At this time patient does not have a safe discharge plan, patient's mother unable to manage him at home, needs SNF facility -will need to readdress with mother about taking patient home now that he's calmer   Status is: Inpatient  Remains inpatient appropriate because:behavioral problems requiring medication adjustments     Status is: Inpatient  Remains inpatient appropriate because:Unsafe d/c plan   Dispo: The patient is from:Home Anticipated d/c is to:SNF Anticipated d/c date is:3days Patient currently is medically stable to d/c.        Family Communication:Fianceupdated 11/7  Consultants:none  Code Status: FULL   DVT Prophylaxis: [place]  Lovenox   Procedures: As Listed in Progress Note Above  Antibiotics: None     Subjective: Patient denies fevers, chills, headache, chest pain, dyspnea, nausea, vomiting, diarrhea, abdominal pain, dysuria, hematuria, hematochezia, and melena.  Objective: Vitals:   01/12/20 0436 01/12/20 0942 01/12/20 2122 01/13/20 0618  BP: 107/75 117/70 118/74 110/76  Pulse: 84 99 98 100  Resp: 20  20 20   Temp: 97.8 F (36.6 C)  98.3 F (36.8 C) 98.5 F (36.9 C)  TempSrc: Oral  Oral Oral  SpO2: 100%  100% 100%  Weight:      Height:        Intake/Output Summary (Last 24 hours) at 01/13/2020 1805 Last data filed at 01/13/2020 1748 Gross per 24 hour  Intake 480 ml  Output 350 ml  Net 130 ml   Weight change:  Exam:   General:  Pt is alert, follows commands appropriately, not in acute distress  HEENT: No icterus, No thrush, No neck mass, [place]/AT  Cardiovascular: RRR, S1/S2, no rubs, no gallops  Respiratory: CTA bilaterally, no wheezing, no crackles, no rhonchi  Abdomen: Soft/+BS, non tender, non distended, no guarding  Extremities: No edema, No lymphangitis, No petechiae, No rashes, no synovitis   Data Reviewed: I have personally reviewed following labs and imaging studies Basic Metabolic Panel: Recent Labs  Lab 01/08/20 1611  NA 137  K 3.7  CL 101  CO2 27  GLUCOSE 121*  BUN 11  CREATININE 0.90  CALCIUM 8.8*   Liver Function Tests: Recent Labs  Lab 01/08/20 1611  AST 18  ALT 14  ALKPHOS 49  BILITOT 0.4  PROT 6.8  ALBUMIN 3.9   No results for input(s): LIPASE, AMYLASE in the last 168 hours. No results for input(s): AMMONIA in the last 168 hours. Coagulation Profile: No results for input(s): INR, PROTIME in the last 168 hours. CBC: Recent Labs  Lab 01/08/20 1611  WBC 6.0  HGB 10.7*  HCT 33.4*  MCV 91.3  PLT 372   Cardiac Enzymes: No results for input(s): CKTOTAL, CKMB, CKMBINDEX, TROPONINI in the last 168 hours. BNP: Invalid input(s):  POCBNP CBG: No results for input(s): GLUCAP in the last 168 hours. HbA1C: No results for input(s): HGBA1C in the last 72 hours. Urine analysis:    Component Value Date/Time   COLORURINE YELLOW 01/01/2020 2133   APPEARANCEUR HAZY (A) 01/01/2020 2133   LABSPEC 1.003 (L) 01/01/2020 2133   PHURINE 6.0 01/01/2020 2133   GLUCOSEU NEGATIVE 01/01/2020 2133   HGBUR LARGE (A) 01/01/2020 2133   BILIRUBINUR NEGATIVE 01/01/2020 2133   KETONESUR 20 (A) 01/01/2020 2133   PROTEINUR 30 (A) 01/01/2020 2133   NITRITE NEGATIVE 01/01/2020 2133   LEUKOCYTESUR LARGE (A) 01/01/2020 2133   Sepsis Labs: @LABRCNTIP (procalcitonin:4,lacticidven:4) )No results found for this or any previous visit (from the past 240 hour(s)).   Scheduled Meds: . ARIPiprazole  5 mg Oral BID  . cefdinir  300 mg Oral Q12H  . diazepam  5 mg Oral Q12H  . enoxaparin (LOVENOX) injection  40 mg Subcutaneous Q24H  . feeding supplement  237 mL Oral BID BM  . ferrous sulfate  325 mg Oral BID WC  . folic acid  1 mg Oral Daily  . lamoTRIgine  100 mg Oral Daily  . metoprolol tartrate  12.5 mg Oral BID  . polyethylene glycol  17 g Oral Daily  . QUEtiapine  150 mg Per Tube BID  . senna  2 tablet Oral Daily   Continuous Infusions: . dextrose 5 % and 0.9% NaCl 100 mL/hr at 01/02/20 01/04/20    Procedures/Studies: CT Head Wo Contrast  Result Date: 01/01/2020 CLINICAL DATA:  Altered mental status. EXAM: CT HEAD WITHOUT CONTRAST TECHNIQUE: Contiguous axial images were obtained from the base of the skull through the vertex without intravenous contrast. COMPARISON:  December 28, 2019 FINDINGS: Brain: There is mild cerebral atrophy with widening of the extra-axial spaces and ventricular dilatation. There are areas of decreased attenuation within the white matter tracts of the supratentorial brain, consistent with microvascular disease changes. Vascular: No hyperdense vessel or unexpected calcification. Skull: Normal. Negative for fracture or  focal lesion. Sinuses/Orbits: A 1.2 cm x 1.1 cm left maxillary sinus polyp versus mucous retention cyst is seen. Other: None. IMPRESSION: 1. Generalized cerebral atrophy. 2. No acute intracranial abnormality. 3. Left maxillary sinus polyp versus mucous retention cyst. Electronically Signed   By: December 30, 2019 M.D.   On: 01/01/2020 21:33   CT Angio Chest PE W/Cm &/Or Wo Cm  Result Date: 01/01/2020 CLINICAL DATA:  Abnormal chest x-ray and tachycardia EXAM: CT ANGIOGRAPHY CHEST WITH CONTRAST TECHNIQUE: Multidetector CT imaging of the chest was performed using the standard protocol during bolus administration of intravenous contrast. Multiplanar CT image reconstructions and MIPs were obtained to evaluate the vascular anatomy. CONTRAST:  01/03/2020 OMNIPAQUE IOHEXOL 350 MG/ML SOLN COMPARISON:  Radiograph same day FINDINGS: Cardiovascular: There is a optimal opacification of the pulmonary arteries. There is no central,segmental, or subsegmental filling defects within the pulmonary arteries. The heart is normal in size. No pericardial effusion or thickening. No evidence right heart strain. There is normal three-vessel brachiocephalic anatomy without proximal stenosis. The thoracic aorta is normal in appearance. Mediastinum/Nodes: No hilar, mediastinal, or axillary adenopathy. Thyroid gland, trachea, and esophagus demonstrate no significant findings. Lungs/Pleura: The lungs are clear. No pleural effusion or pneumothorax. No airspace consolidation. Upper Abdomen: No acute abnormalities present in the visualized portions of the upper abdomen.  Percutaneous gastrotomy tube seen within the mid body of the stomach. Musculoskeletal: No chest wall abnormality. No acute or significant osseous findings. Review of the MIP images confirms the above findings. IMPRESSION: No central, segmental, or subsegmental pulmonary embolism. No other acute intrathoracic pathology to explain the patient's symptoms. Electronically Signed   By:  Jonna Clark M.D.   On: 01/01/2020 21:33   DG Chest Portable 1 View  Result Date: 01/01/2020 CLINICAL DATA:  Increased agitation for 4 days, history of brain injury EXAM: PORTABLE CHEST 1 VIEW COMPARISON:  Portable exam 1550 hours compared to 12/28/2019 and 09/28/2019 FINDINGS: Normal heart size, mediastinal contours, and pulmonary vascularity. New somewhat focal but ill-defined opacity in the RIGHT mid lung question pneumonia versus superimposed artifact. Remaining lungs clear. No pleural effusion or pneumothorax. Osseous structures unremarkable. IMPRESSION: New somewhat focal but ill-defined opacity in RIGHT mid lung question pneumonia; this was not seen on the prior study of 12/28/2019 nor on an earlier exam from 09/28/2019. Radiographic follow-up until resolution recommended to exclude pulmonary nodule. Electronically Signed   By: Ulyses Southward M.D.   On: 01/01/2020 16:05    Catarina Hartshorn, DO  [facility]  If 7PM-7AM, please contact night-coverage [email or web address] Password [email or web address] 01/13/2020, 6:05 PM   LOS: 12 days

## 2020-01-14 ENCOUNTER — Inpatient Hospital Stay (HOSPITAL_COMMUNITY): Payer: Medicaid Other

## 2020-01-14 NOTE — Progress Notes (Signed)
Patient will not keep tele box on during shift, MD aware. Will continue to round on patient appropriately.

## 2020-01-14 NOTE — Progress Notes (Signed)
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 He had elevated troponins over 5000.Due to combativeness, he required sedation and subsequent intubation [facility] with [facility]. He was started on ceftriaxone. Due to possible seizure, he was started on Keppra and placed on continuous EEG which was negative for seizure activity. 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 He received a 5 day course of SoluMedrol for cerebral edema. He then developed exteensor posturing with tachycardia and hypertension concerning for elevated intracranial pressure. He underwent Bolt ICP monitoring which demonstrated no elevated ICP. Follow up CT head and subsequent repeat MRI of brain showed small subacute hemorrhage in the right suboccipital lobe, either hemorrhagic conversion of small ischemic stroke vs extension of previously seen microhemorrhage, as well as hypoxic injury involving the bilateral caudate and putamen. He also sustained a NSTEMI. He was extubated on 10/10/2019 and underwent slow taper of Dilaudid to minimize withdrawal symptoms. A G-tube was inserted on 10/16/2019 and he was discharged on 10/18/2019 under the care of his mother who is a CNA.   He has been going to outpatient PT/OT and has been getting stronger. He is still taking food and medication via G-tube. He has not been able to establish care with a PCP yet. He  is easily angry and has kicked his mother. He does not talk much and often when he speaks, it may be a curse word. He is still incontinent. He requiredassistance with essential ADLs such as bathing, dressing and using toilet.  Assessment/Plan: Acuteon chronicmetabolic encephalopathy--- worsening behavioral problems superimposed on underlying anoxic brain injury -AddedAbilify 5 mg twice daily -Continue Lamictal 100 mg daily -Keppra was recently discontinued as patient's EEG was nonepileptiformand may have been contributing to increased agitation --Lorazepam or Haldol as needed agitation -continue to gradually titrate up seroquel -I have discussed the risks/benefits of seroquel including its "black box warning" with patient's mother  -she expresses understanding in light of the patient's behavioral issues -She expresses that other medications and change of venue/scenery have been tried to try to provide care for the patient -in light of her inability to care for the patient and failure of other modalities, she accepts the risks with seroquel in hopes that his behavior can be controlled to allow for care of the patient -I suspect that his overall mental status with periods of agitation ishis usual baseline at home -continuevalium bid -11/5 increased seroquel to 150 mg bid -11/6 overall less aggression but continues to have episodes of agitation -01/11/20--restraints off for ~24 hours>>72 -overall now more re-directable and following commands; no further aggressive behavior after adjustment of medications  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  SVT/HTN -patient had intermittent episodes of SVT -controlled and resolved with the use of metoprolol -will observed on telemetry for another 24 hours for stability assessment and if stable will D/C tele.  Social/Ethics -patient's mother was a trained CNA is no longer able to manage him at home due to escalating behaviors --Patient remains total care- He is still taking food and medication via G-tube. -He is easily angry, gets agitated and may kick and punch. He does not talk much. He is still incontinent. He requires assistance with essential ADLs such as bathing, dressing and using toilet -Familydoes not feel that they can safely manage the patient at home and arerequesting placement to facility. -Social work consultation to help with placement requested  chronic anemia/iron deficiency anemia -multifactorial , partly nutritional, expect further drop in H&H with hydration/hemodilution -iron sat--9% -folate 7.1 -B12--2310 -startedon po iron and folate  presumed UTI --patient didn't meet sepsis criteria -has been treatedempirically with IV Rocephinx 3 days -No dysuria, no frank hematuria or complaints of suprapubic abdominal pain.  Disposition/Need for in-Hospital Stay- patient unable to be discharged at this time due to -behavioral problems requiring medication adjustments, --At this time patient does not have a safe discharge plan, patient's mother unable to manage him at home, will need SNF facility -I have discussed with mother at bedside discharge options and she has expressed that given her age and physical limitations no longer able to care for him at home. Social work has been made aware and will continue to follow on their assistance with placement.  Status is: Inpatient  Remains inpatient appropriate because:behavioral problems requiring  medication adjustments     Status is: Inpatient  Remains inpatient appropriate because: Unsafe d/c plan   Dispo: The patient is from:Home Anticipated d/c is to:SNF versus group home. Anticipated d/c date is:To be determined. Patient currently is overall medically stable to d/c. Unfortunately no longer being able to be care by his mother at home due to physical limitations. From a safety standpoint will require to be placed for further care and assistance. Physical therapy has been contacted to evaluate patient physical capacity.     Family Communication:Mother updated at bedside on 01/14/2020.  Consultants:none  Code Status: FULL   DVT Prophylaxis: [place] Lovenox   Procedures: As Listed in Progress Note Above  Antibiotics: None     Subjective: Nursing staff reports intermittent episode of agitation and inability for redirection. There has not been any fever, chest pain, nausea vomiting. Earlier this morning he had an episode of falling out of bed without any frank appreciated injury on examination. Patient expressing pain on his left elbow.   Objective: Vitals:   01/12/20 0942 01/12/20 2122 01/13/20 0618 01/14/20 0548  BP: 117/70 118/74 110/76 106/75  Pulse: 99 98 100 96  Resp:  20 20 20   Temp:  98.3 F (36.8 C) 98.5 F (36.9 C) (!) 97.3 F (36.3 C)  TempSrc:  Oral Oral Oral  SpO2:  100% 100% 100%  Weight:      Height:        Intake/Output Summary (Last 24 hours) at 01/14/2020 1408 Last data filed at 01/13/2020 1748 Gross per 24 hour  Intake 240 ml  Output --  Net 240 ml   Weight change:   Exam: General exam: Alert, awake and oriented x1; following commands appropriately and in my examination was calm and in no acute distress. Patient is afebrile. Expressed complaints of left elbow pain. Respiratory system: Clear to  auscultation. Respiratory effort normal. Good oxygen saturation on room  air without requirement of oxygen supplementation. Cardiovascular system:RRR. No murmurs, rubs, gallops. No JVD Gastrointestinal system: Abdomen is nondistended, soft and nontender. No organomegaly or masses felt. Normal bowel sounds heard. Central nervous system: No new focal neurological deficits. Patient was weak and requiring assistance by nurse/nurse take for repositioning and minimal ambulation. Extremities: No cyanosis, clubbing or edema. Skin: No rashes or petechiae. Psychiatry: Mood & affect appropriate currently..    Data Reviewed: I have personally reviewed following labs and imaging studies  Basic Metabolic Panel: Recent Labs  Lab 01/08/20 1611  NA 137  K 3.7  CL 101  CO2 27  GLUCOSE 121*  BUN 11  CREATININE 0.90  CALCIUM 8.8*   Liver Function Tests: Recent Labs  Lab 01/08/20 1611  AST 18  ALT 14  ALKPHOS 49  BILITOT 0.4  PROT 6.8  ALBUMIN 3.9   CBC: Recent Labs  Lab 01/08/20 1611  WBC 6.0  HGB 10.7*  HCT 33.4*  MCV 91.3  PLT 372   Urine analysis:    Component Value Date/Time   COLORURINE YELLOW 01/01/2020 2133   APPEARANCEUR HAZY (A) 01/01/2020 2133   LABSPEC 1.003 (L) 01/01/2020 2133   PHURINE 6.0 01/01/2020 2133   GLUCOSEU NEGATIVE 01/01/2020 2133   HGBUR LARGE (A) 01/01/2020 2133   BILIRUBINUR NEGATIVE 01/01/2020 2133   KETONESUR 20 (A) 01/01/2020 2133   PROTEINUR 30 (A) 01/01/2020 2133   NITRITE NEGATIVE 01/01/2020 2133   LEUKOCYTESUR LARGE (A) 01/01/2020 2133   Scheduled Meds: . ARIPiprazole  5 mg Oral BID  . diazepam  5 mg Oral Q12H  . enoxaparin (LOVENOX) injection  40 mg Subcutaneous Q24H  . feeding supplement  237 mL Oral BID BM  . ferrous sulfate  325 mg Oral BID WC  . folic acid  1 mg Oral Daily  . lamoTRIgine  100 mg Oral Daily  . metoprolol tartrate  12.5 mg Oral BID  . polyethylene glycol  17 g Oral Daily  . QUEtiapine  150 mg Per Tube BID  . senna  2 tablet Oral Daily   Continuous Infusions: . dextrose 5 % and 0.9%  NaCl 100 mL/hr at 01/02/20 8182    Procedures/Studies: CT Head Wo Contrast  Result Date: 01/01/2020 CLINICAL DATA:  Altered mental status. EXAM: CT HEAD WITHOUT CONTRAST TECHNIQUE: Contiguous axial images were obtained from the base of the skull through the vertex without intravenous contrast. COMPARISON:  December 28, 2019 FINDINGS: Brain: There is mild cerebral atrophy with widening of the extra-axial spaces and ventricular dilatation. There are areas of decreased attenuation within the white matter tracts of the supratentorial brain, consistent with microvascular disease changes. Vascular: No hyperdense vessel or unexpected calcification. Skull: Normal. Negative for fracture or focal lesion. Sinuses/Orbits: A 1.2 cm x 1.1 cm left maxillary sinus polyp versus mucous retention cyst is seen. Other: None. IMPRESSION: 1. Generalized cerebral atrophy. 2. No acute intracranial abnormality. 3. Left maxillary sinus polyp versus mucous retention cyst. Electronically Signed   By: Aram Candela M.D.   On: 01/01/2020 21:33   CT Angio Chest PE W/Cm &/Or Wo Cm  Result Date: 01/01/2020 CLINICAL DATA:  Abnormal chest x-ray and tachycardia EXAM: CT ANGIOGRAPHY CHEST WITH CONTRAST TECHNIQUE: Multidetector CT imaging of the chest was performed using the standard protocol during bolus administration of intravenous contrast. Multiplanar CT image reconstructions and MIPs were obtained to evaluate the vascular anatomy. CONTRAST:  OMNIPAQUE IOHEXOL 350 MG/ML SOLN COMPARISON:  Radiograph same day FINDINGS: Cardiovascular: There is a optimal opacification of the pulmonary arteries. There is no central,segmental, or subsegmental filling defects within the pulmonary arteries. The heart is normal in size. No pericardial effusion or thickening. No evidence right heart strain. There is normal three-vessel brachiocephalic anatomy without proximal stenosis. The thoracic aorta is normal in appearance. Mediastinum/Nodes: No  hilar, mediastinal, or axillary adenopathy. Thyroid gland, trachea, and esophagus demonstrate no significant findings. Lungs/Pleura: The lungs are clear. No pleural effusion or pneumothorax. No airspace consolidation. Upper Abdomen: No acute abnormalities present in the visualized portions of the upper abdomen. Percutaneous gastrotomy tube seen within the mid body of the stomach. Musculoskeletal: No chest wall abnormality. No acute or significant osseous findings. Review of the MIP images confirms the above findings. IMPRESSION: No central, segmental, or subsegmental pulmonary embolism. No other acute intrathoracic pathology to explain the patient's symptoms. Electronically Signed   By: Jonna Clark M.D.   On: 01/01/2020 21:33   DG Chest Portable 1 View  Result Date: 01/01/2020 CLINICAL DATA:  Increased agitation for 4 days, history of brain injury EXAM: PORTABLE CHEST 1 VIEW COMPARISON:  Portable exam 1550 hours compared to 12/28/2019 and 09/28/2019 FINDINGS: Normal heart size, mediastinal contours, and pulmonary vascularity. New somewhat focal but ill-defined opacity in the RIGHT mid lung question pneumonia versus superimposed artifact. Remaining lungs clear. No pleural effusion or pneumothorax. Osseous structures unremarkable. IMPRESSION: New somewhat focal but ill-defined opacity in RIGHT mid lung question pneumonia; this was not seen on the prior study of 12/28/2019 nor on an earlier exam from 09/28/2019. Radiographic follow-up until resolution recommended to exclude pulmonary nodule. Electronically Signed   By: Ulyses Southward M.D.   On: 01/01/2020 16:05    Vassie Loll, MD  [facility]  If 7PM-7AM, please contact night-coverage [email or web address] Password [email or web address] 01/14/2020, 2:08 PM   LOS: 13 days

## 2020-01-14 NOTE — Progress Notes (Signed)
Rounded on patient. Mother present at bedside. Expressed concerns regarding patient c/o pain to left arm/elbow. No bruising or swelling noted on assessment. Patient able to flex/extend with no apparent difficulty. States "it hurts pretty bad". Notified Dr. Gwenlyn Perking. Mother leaving around 2 pm to pick up grandkids. Expressed concerns for safety when she leaves. Discussed fall prevention measures/safety plan. bedalarm on for safety, room near nurses station, hourly rounding, and will add fall mats and ask provider for safety sitter. Requested Recruitment consultant order.

## 2020-01-14 NOTE — TOC Progression Note (Signed)
Transition of Care [facility]) - Progression Note   Patient Details  Name: Prateek Knipple MRN: [id number] Date of Birth: 1980-06-15  [facility] CM/SW Contact  Ewing Schlein, LCSW Phone Number: 01/14/2020, 4:00 PM  Clinical Narrative: CSW spoke with patient's mother regarding CAP/DA referral if placement cannot be found due to behaviors and/or level of care. Per mother, she did not complete legal guardianship and patient was receiving his tube feeding supplies from [employer] in [place]. PCP information updated in [facility]. CAP referral completed and faxed. TOC to follow.  Barriers to Discharge: No SNF bed  Readmission Risk Interventions No flowsheet data found.

## 2020-01-15 DIAGNOSIS — N3001 Acute cystitis with hematuria: Secondary | ICD-10-CM | POA: Diagnosis not present

## 2020-01-15 DIAGNOSIS — I69319 Unspecified symptoms and signs involving cognitive functions following cerebral infarction: Secondary | ICD-10-CM | POA: Diagnosis not present

## 2020-01-15 DIAGNOSIS — R451 Restlessness and agitation: Secondary | ICD-10-CM | POA: Diagnosis not present

## 2020-01-15 DIAGNOSIS — R4689 Other symptoms and signs involving appearance and behavior: Secondary | ICD-10-CM | POA: Diagnosis not present

## 2020-01-15 LAB — VITAMIN B1: Vitamin B1 (Thiamine): 84.5 nmol/L (ref 66.5–200.0)

## 2020-01-15 NOTE — Progress Notes (Signed)
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 He had elevated troponins over 5000.Due to combativeness, he required sedation and subsequent intubation [facility] and [facility] with pneumococcal [facility]. He was started on ceftriaxone. Due to possible seizure, he was started on Keppra and placed on continuous EEG which was negative for seizure activity. 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 He received a 5 day course of SoluMedrol for cerebral edema. He then developed exteensor posturing with tachycardia and hypertension concerning for elevated intracranial pressure. He underwent Bolt ICP monitoring which demonstrated no elevated ICP. Follow up CT head and subsequent repeat MRI of brain showed small subacute hemorrhage in the right suboccipital lobe, either hemorrhagic conversion of small ischemic stroke vs extension of previously seen microhemorrhage, as well as hypoxic injury involving the bilateral caudate and putamen. He also sustained a NSTEMI. He was extubated on 10/10/2019 and underwent slow taper of Dilaudid to minimize withdrawal symptoms. A G-tube was inserted on 10/16/2019 and he was discharged on 10/18/2019 under the care of his mother who is a CNA.   He has been going to outpatient PT/OT and has been getting stronger. He is still taking food and medication via G-tube. He has not been able to establish care with a  PCP yet. He is easily angry and has kicked his mother. He does not talk much and often when he speaks, it may be a curse word. He is still incontinent. He requiredassistance with essential ADLs such as bathing, dressing and using toilet.  Assessment/Plan: Acuteon chronicmetabolic encephalopathy--- worsening behavioral problems superimposed on underlying anoxic brain injury -AddedAbilify 5 mg twice daily -Continue Lamictal 100 mg daily -Keppra was recently discontinued as patient's EEG was nonepileptiformand may have been contributing to increased agitation --Lorazepam or Haldol as needed agitation -continue to gradually titrate up seroquel -I have discussed the risks/benefits of seroquel including its "black box warning" with patient's mother  -she expresses understanding in light of the patient's behavioral issues -She expresses that other medications and change of venue/scenery have been tried to try to provide care for the patient -in light of her inability to care for the patient and failure of other modalities, she accepts the risks with seroquel in hopes that his behavior can be controlled to allow for care of the patient -I suspect that his overall mental status with periods of agitation ishis usual baseline at home -continuevalium bid -11/5 increased seroquel to 150 mg bid -11/6 overall less aggression but continues to have episodes of agitation -01/11/20--restraints off for ~24 hours>>72 -overall now more re-directable and following commands; no further aggressive behavior after adjustment of medications  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  SVT/HTN -patient had intermittent episodes of SVT -controlled and resolved with the use of metoprolol -will observed on telemetry for another 24 hours for stability assessment and if stable will D/C tele.  Social/Ethics -patient's mother was a trained CNA is no longer able to manage him at home due to escalating behaviors --Patient remains total care- He is still taking food and medication via G-tube. -He is easily angry, gets agitated and may kick and punch. He does not talk much. He is still incontinent. He requires assistance with essential ADLs such as bathing, dressing and using toilet -Familydoes not feel that they can safely manage the patient at home and arerequesting placement to facility. -Social work consultation to help with placement requested  chronic anemia/iron deficiency anemia -multifactorial , partly nutritional, expect further drop in H&H with hydration/hemodilution -iron sat--9% -folate 7.1 -B12--2310 -startedon po iron and folate  presumed UTI --patient didn't meet sepsis criteria -has been treatedempirically with IV Rocephinx 3 days -No dysuria, no frank hematuria or complaints of suprapubic abdominal pain.  Left elbow pain -Patient with ongoing weakness fall out of bed on 2020-01-14 -No swelling, skin breakdown or fractures appreciated on images. -Continue to closely monitor patient's and use seated if required for safety.  Disposition/Need for in-Hospital Stay- patient unable to be discharged at this time due to -behavioral problems requiring medication adjustments, --At this time patient does not have a safe discharge plan, patient's mother unable to manage him at home, will need SNF facility -I have discussed with mother at bedside discharge options and she has expressed that given her age and physical  limitations no longer able to care for him at home. Social work has been made aware and will continue to follow on their assistance with placement.  Status is: Inpatient  Remains inpatient appropriate because:behavioral problems requiring medication adjustments     Status is: Inpatient  Remains inpatient appropriate because: Unsafe d/c plan   Dispo: The patient is from:Home Anticipated d/c is to:SNF versus group home. Anticipated d/c date is:To be determined. Patient currently is overall medically stable to d/c. Unfortunately no longer being able to be care by his mother at home due to physical limitations. From a safety standpoint will require to be placed for further care and assistance. Physical therapy has been contacted to evaluate patient physical capacity and will follow recommendations.    Family Communication:Mother updated at bedside on 01/14/2020.  Consultants:none  Code Status: FULL   DVT Prophylaxis: Butts Lovenox   Procedures: As Listed in Progress Note Above  Antibiotics: None     Subjective: Still reporting some pain in his left elbow, no fever, no nausea, no vomiting, no chest pain, no shortness of breath.  Generally weak and deconditioned.  Objective: Vitals:   01/14/20 0548 01/14/20 0835 01/14/20 2000 01/15/20 0537  BP: 106/75 115/80 108/69 110/72  Pulse: 96 100 96 85  Resp: 20 20 20 18   Temp: (!) 97.3 F (36.3 C) (!) 97.3 F (36.3 C) 98 F (36.7 C) 98.9 F (37.2 C)  TempSrc: Oral Oral  Oral  SpO2: 100% 100% 98% 100%  Weight:      Height:       No intake or output data in the 24 hours ending 01/15/20 1239 Weight change:   Exam: General exam: Alert, awake, oriented x 1; patient is calm, following simple commands appropriately and in no acute distress.  Denies chest pain, no nausea, no vomiting, no shortness of breath.  Patient is afebrile.  Still reporting some discomfort  in his left elbow, but able to move arm without difficulties. Respiratory system: Clear to auscultation. Respiratory effort normal. Cardiovascular system:RRR. No murmurs, rubs, gallops. Gastrointestinal system: Abdomen is nondistended, soft and nontender. No organomegaly or masses felt. Normal bowel sounds heard. Central nervous system: No new focal neurological deficits. Extremities: No C/C/E, +pedal pulses Skin: No rashes, lesions or ulcers Psychiatry:  Mood & affect appropriate.    Data Reviewed: I have personally reviewed following labs and imaging studies  Basic Metabolic Panel: Recent Labs  Lab 01/08/20 1611  NA 137  K 3.7  CL 101  CO2 27  GLUCOSE 121*  BUN 11  CREATININE 0.90  CALCIUM 8.8*   Liver Function Tests: Recent Labs  Lab 01/08/20 1611  AST 18  ALT 14  ALKPHOS 49  BILITOT 0.4  PROT 6.8  ALBUMIN 3.9   CBC: Recent Labs  Lab 01/08/20 1611  WBC 6.0  HGB 10.7*  HCT 33.4*  MCV 91.3  PLT 372   Urine analysis:    Component Value Date/Time   COLORURINE YELLOW 01/01/2020 2133   APPEARANCEUR HAZY (A) 01/01/2020 2133   LABSPEC 1.003 (L) 01/01/2020 2133   PHURINE 6.0 01/01/2020 2133   GLUCOSEU NEGATIVE 01/01/2020 2133   HGBUR LARGE (A) 01/01/2020 2133   BILIRUBINUR NEGATIVE 01/01/2020 2133   KETONESUR 20 (A) 01/01/2020 2133   PROTEINUR 30 (A) 01/01/2020 2133   NITRITE NEGATIVE 01/01/2020 2133   LEUKOCYTESUR LARGE (A) 01/01/2020 2133   Scheduled Meds: . ARIPiprazole  5 mg Oral BID  . diazepam  5 mg Oral Q12H  . enoxaparin (LOVENOX) injection  40 mg Subcutaneous Q24H  . feeding supplement  237 mL Oral BID BM  . ferrous sulfate  325 mg Oral BID WC  . folic acid  1 mg Oral Daily  . lamoTRIgine  100 mg Oral Daily  . metoprolol tartrate  12.5 mg Oral BID  . polyethylene glycol  17 g Oral Daily  . QUEtiapine  150 mg Per Tube BID  . senna  2 tablet Oral Daily   Continuous Infusions: . dextrose 5 % and 0.9% NaCl 100 mL/hr at 01/02/20 3545     Procedures/Studies: DG Elbow 2 Views Left  Result Date: 01/14/2020 CLINICAL DATA:  Pain status post fall EXAM: LEFT ELBOW - 2 VIEW COMPARISON:  None. FINDINGS: There is no evidence of fracture, dislocation, or joint effusion. There is no evidence of arthropathy or other focal bone abnormality. Soft tissues are unremarkable. IMPRESSION: Negative. Electronically Signed   By: Katherine Mantle M.D.   On: 01/14/2020 19:31   CT Head Wo Contrast  Result Date: 01/01/2020 CLINICAL DATA:  Altered mental status. EXAM: CT HEAD WITHOUT CONTRAST TECHNIQUE: Contiguous axial images were obtained from the base of the skull through the vertex without intravenous contrast. COMPARISON:  December 28, 2019 FINDINGS: Brain: There is mild cerebral atrophy with widening of the extra-axial spaces and ventricular dilatation. There are areas of decreased attenuation within the white matter tracts of the supratentorial brain, consistent with microvascular disease changes. Vascular: No hyperdense vessel or unexpected calcification. Skull: Normal. Negative for fracture or focal lesion. Sinuses/Orbits: A 1.2 cm x 1.1 cm left maxillary sinus polyp versus mucous retention cyst is seen. Other: None. IMPRESSION: 1. Generalized cerebral atrophy. 2. No acute intracranial abnormality. 3. Left maxillary sinus polyp versus mucous retention cyst. Electronically Signed   By: Aram Candela M.D.   On: 01/01/2020 21:33   CT Angio Chest  PE W/Cm &/Or Wo Cm  Result Date: 01/01/2020 CLINICAL DATA:  Abnormal chest x-ray and tachycardia EXAM: CT ANGIOGRAPHY CHEST WITH CONTRAST TECHNIQUE: Multidetector CT imaging of the chest was performed using the standard protocol during bolus administration of intravenous contrast. Multiplanar CT image reconstructions and MIPs were obtained to evaluate the vascular anatomy. CONTRAST:  OMNIPAQUE IOHEXOL 350 MG/ML SOLN COMPARISON:  Radiograph same day FINDINGS: Cardiovascular: There is a optimal  opacification of the pulmonary arteries. There is no central,segmental, or subsegmental filling defects within the pulmonary arteries. The heart is normal in size. No pericardial effusion or thickening. No evidence right heart strain. There is normal three-vessel brachiocephalic anatomy without proximal stenosis. The thoracic aorta is normal in appearance. Mediastinum/Nodes: No hilar, mediastinal, or axillary adenopathy. Thyroid gland, trachea, and esophagus demonstrate no significant findings. Lungs/Pleura: The lungs are clear. No pleural effusion or pneumothorax. No airspace consolidation. Upper Abdomen: No acute abnormalities present in the visualized portions of the upper abdomen. Percutaneous gastrotomy tube seen within the mid body of the stomach. Musculoskeletal: No chest wall abnormality. No acute or significant osseous findings. Review of the MIP images confirms the above findings. IMPRESSION: No central, segmental, or subsegmental pulmonary embolism. No other acute intrathoracic pathology to explain the patient's symptoms. Electronically Signed   By: Jonna Clark M.D.   On: 01/01/2020 21:33   DG Chest Portable 1 View  Result Date: 01/01/2020 CLINICAL DATA:  Increased agitation for 4 days, history of brain injury EXAM: PORTABLE CHEST 1 VIEW COMPARISON:  Portable exam 1550 hours compared to 12/28/2019 and 09/28/2019 FINDINGS: Normal heart size, mediastinal contours, and pulmonary vascularity. New somewhat focal but ill-defined opacity in the RIGHT mid lung question pneumonia versus superimposed artifact. Remaining lungs clear. No pleural effusion or pneumothorax. Osseous structures unremarkable. IMPRESSION: New somewhat focal but ill-defined opacity in RIGHT mid lung question pneumonia; this was not seen on the prior study of 12/28/2019 nor on an earlier exam from 09/28/2019. Radiographic follow-up until resolution recommended to exclude pulmonary nodule. Electronically Signed   By: Ulyses Southward M.D.    On: 01/01/2020 16:05    Vassie Loll, MD  [facility]  If 7PM-7AM, please contact night-coverage [email or web address] Password [email or web address] 01/15/2020, 12:39 PM   LOS: 14 days

## 2020-01-15 NOTE — Progress Notes (Signed)
   01/14/20 0835  What Happened  Was fall witnessed? Yes  Who witnessed fall? Doristine Mango NT  Patients activity before fall ambulating-unassisted  Point of contact other (comment) (knees)  Was patient injured? No  Patient found on floor  Found by Staff-comment  Stated prior activity other (comment) (in bed)  Follow Up  MD notified Dr. Gwenlyn Perking  Time MD notified [phone]  Family notified Yes - comment  Time family notified 1130  Additional tests No  Simple treatment Other (comment) (none)  Progress note created (see row info) Yes  Adult Fall Risk Assessment  Risk Factor Category (scoring not indicated) High fall risk per protocol (document High fall risk)  Patient Fall Risk Level High fall risk  Adult Fall Risk Interventions  Required Bundle Interventions *See Row Information* High fall risk - low, moderate, and high requirements implemented  Additional Interventions Use of appropriate toileting equipment (bedpan, BSC, etc.);Safety Sitter/Safety Rounder;Room near nurses station;Reorient/diversional activities with confused patients;Family Supervision  Screening for Fall Injury Risk (To be completed on HIGH fall risk patients) - Assessing Need for Low Bed  Risk For Fall Injury- Low Bed Criteria Previous fall this admission  Will Implement Low Bed and Floor Mats Yes  Screening for Fall Injury Risk (To be completed on HIGH fall risk patients who do not meet crieteria for Low Bed) - Assessing Need for Floor Mats Only  Risk For Fall Injury- Criteria for Floor Mats Noncompliant with safety precautions;Confusion/dementia (+NuDESC, CIWA, TBI, etc.)  Will Implement Floor Mats Yes  Vitals  Temp (!) 97.3 F (36.3 C)  Temp Source Oral  BP 115/80  MAP (mmHg) 92  BP Location Left Arm  BP Method Automatic  Patient Position (if appropriate) Sitting  Pulse Rate 100  Resp 20  Oxygen Therapy  SpO2 100 %  O2 Device Room Air  Pain Assessment  Pain Scale PAINAD  Pain Score 0  PAINAD (Pain  Assessment in Advanced Dementia)  Breathing 0  Negative Vocalization 0  Facial Expression 0  Body Language 0  Consolability 0  PAINAD Score 0  PCA/Epidural/Spinal Assessment  Respiratory Pattern Regular;Unlabored  Neurological  Neuro (WDL) X  Level of Consciousness Alert  Orientation Level Oriented to person  Cognition Poor judgement;Poor attention/concentration;Impulsive;Poor safety awareness;Unable to follow commands  Speech Clear  Pupil Assessment  No  Neuro Symptoms Anxiety;Forgetful  Neuro symptoms relieved by Anti-anxiety medication;Rest;Relaxation techniques (Comment)  Glasgow Coma Scale  Eye Opening 4  Best Verbal Response (NON-intubated) 4  Best Motor Response 6  Glasgow Coma Scale Score 14  Musculoskeletal  Musculoskeletal (WDL) X  Assistive Device None  Generalized Weakness Yes  Weight Bearing Restrictions No  Integumentary  Integumentary (WDL) WDL  Skin Color Appropriate for ethnicity  Skin Condition Dry  Skin Integrity Intact  Skin Turgor Non-tenting  Pain Screening  Clinical Progression Not changed  Pain Assessment  Work-Related Injury No

## 2020-01-15 NOTE — Plan of Care (Signed)
  Problem: Acute Rehab PT Goals(only PT should resolve) Goal: Pt Will Go Supine/Side To Sit Outcome: Progressing Flowsheets (Taken 01/15/2020 1539) Pt will go Supine/Side to Sit:  with min guard assist  with minimal assist Goal: Patient Will Transfer Sit To/From Stand Outcome: Progressing Flowsheets (Taken 01/15/2020 1539) Patient will transfer sit to/from stand: with minimal assist Goal: Pt Will Transfer Bed To Chair/Chair To Bed Outcome: Progressing Flowsheets (Taken 01/15/2020 1539) Pt will Transfer Bed to Chair/Chair to Bed: with min assist Goal: Pt Will Ambulate Outcome: Progressing Flowsheets (Taken 01/15/2020 1539) Pt will Ambulate:  50 feet  with minimal assist  with moderate assist Note: 2 person hand held assist   3:40 PM, 01/15/20 Ocie Bob, MPT Physical Therapist with [facility] [phone] office [phone] mobile phone

## 2020-01-15 NOTE — Evaluation (Signed)
Physical Therapy Evaluation Patient Details Name: Isaac [name] MRN: [id number] DOB: [birth year] Today's Date: 01/15/2020   History of Present Illness  Isaac [name] is a 39 y.o. male with medical history significant of history of bacterial meningitis about 3 months ago, NSTEMI due to demand ischemia during his meningitis hospitalization (see EDP note for attached discharge summary) who is brought to the emergency department by family members due to the patient's worsening mental status.  The patient's mental status has been progressively worse.  He was seen at [facility] about 4 days ago and given Geodon due to restlessness, but subsequently discharged home.  His mother has been having a very difficult time taking care of him at home.  Neurology has been trying to assist in long-term placement, but the patient is uninsured and his mother is unable to afford it.  Neurology recently discontinue Keppra and started on Lamictal to control his agitation.  He has also been given Seroquel without significant results.    Clinical Impression  Patient presents confused and easily agitated, able to cooperate with therapy with constant verbal/tactile cueing to follow instructions.  Patient demonstrates slow labored movement for sitting up at bedside, required 2 person hand held assist to complete sit to stands, transfer to commode in bathroom and ambulate in room with slow labored movement with frequent buckling of knees due to weakness.  Patient tolerated sitting up in chair after therapy with visitor in room - nursing staff aware.  Patient will benefit from continued physical therapy in hospital and recommended venue below to increase strength, balance, endurance for safe ADLs and gait.     Follow Up Recommendations SNF    Equipment Recommendations  Other (comment) (to be determined)    Recommendations for Other Services       Precautions / Restrictions Precautions Precautions:  Fall Restrictions Weight Bearing Restrictions: No      Mobility  Bed Mobility Overal bed mobility: Needs Assistance Bed Mobility: Supine to Sit     Supine to sit: Min assist;Mod assist          Transfers Overall transfer level: Needs assistance Equipment used: 2 person hand held assist Transfers: Sit to/from [employer] Sit to Stand: Mod assist Stand pivot transfers: Mod assist       General transfer comment: required 2 person hand held assist mostly due to having to constantly redirect patient to tasks  Ambulation/Gait Ambulation/Gait assistance: Mod assist;+2 physical assistance Gait Distance (Feet): 15 Feet Assistive device: 2 person hand held assist Gait Pattern/deviations: Decreased step length - right;Decreased step length - left;Decreased stride length Gait velocity: decreased   General Gait Details: slow labored cadence, very unsteady, required 2 person hand held assist for safety and constant redirecting patient to tasks  Stairs            Wheelchair Mobility    Modified Rankin (Stroke Patients Only)       Balance Overall balance assessment: Needs assistance Sitting-balance support: Feet supported;No upper extremity supported Sitting balance-Leahy Scale: Fair Sitting balance - Comments: seated at EOB   Standing balance support: During functional activity;Bilateral upper extremity supported Standing balance-Leahy Scale: Poor Standing balance comment: fair/poor with 2 person hand held assist                             Pertinent Vitals/Pain Pain Assessment: No/denies pain    Home Living Family/patient expects to be discharged to:: Private residence Living Arrangements: Parent;Non-relatives/Friends Available  Help at Discharge: Family;Available PRN/intermittently Type of Home: House Home Access: Ramped entrance     Home Layout: One level Home Equipment: Shower seat - built in      Prior Function Level of  Independence: Needs assistance   Gait / Transfers Assistance Needed: One person hand held assistance for short household distances  ADL's / Engineer, water Assistance Needed: assisted by family, takes bed baths        Hand Dominance        Extremity/Trunk Assessment   Upper Extremity Assessment Upper Extremity Assessment: Generalized weakness    Lower Extremity Assessment Lower Extremity Assessment: Generalized weakness    Cervical / Trunk Assessment Cervical / Trunk Assessment: Normal  Communication   Communication: Expressive difficulties  Cognition Arousal/Alertness: Awake/alert Behavior During Therapy: Impulsive;Restless Overall Cognitive Status: History of cognitive impairments - at baseline                                        General Comments      Exercises     Assessment/Plan    PT Assessment Patient needs continued PT services  PT Problem List Decreased strength;Decreased activity tolerance;Decreased balance;Decreased mobility       PT Treatment Interventions Balance training;Gait training;Stair training;Functional mobility training;Therapeutic activities;Therapeutic exercise;Patient/family education    PT Goals (Current goals can be found in the Care Plan section)  Acute Rehab PT Goals Patient Stated Goal: not stated PT Goal Formulation: With patient/family Time For Goal Achievement: 01/29/20 Potential to Achieve Goals: Fair    Frequency Min 3X/week   Barriers to discharge        Co-evaluation               AM-PAC PT "6 Clicks" Mobility  Outcome Measure Help needed turning from your back to your side while in a flat bed without using bedrails?: A Little Help needed moving from lying on your back to sitting on the side of a flat bed without using bedrails?: A Little Help needed moving to and from a bed to a chair (including a wheelchair)?: A Lot Help needed standing up from a chair using your arms (e.g., wheelchair or  bedside chair)?: A Lot Help needed to walk in hospital room?: A Lot Help needed climbing 3-5 steps with a railing? : Total 6 Click Score: 13    End of Session   Activity Tolerance: Patient tolerated treatment well;Patient limited by fatigue Patient left: in chair;with call bell/phone within reach;with chair alarm set;with family/visitor present Nurse Communication: Mobility status PT Visit Diagnosis: Unsteadiness on feet (R26.81);Other abnormalities of gait and mobility (R26.89);Muscle weakness (generalized) (M62.81)    Time: 1610-9604 PT Time Calculation (min) (ACUTE ONLY): 20 min   Charges:   PT Evaluation $PT Eval Moderate Complexity: 1 Mod PT Treatments $Therapeutic Activity: 8-22 mins        3:32 PM, 01/15/20 Ocie Bob, MPT Physical Therapist with [facility] 336 [phone] office [phone] mobile phone

## 2020-01-15 NOTE — Progress Notes (Signed)
Nutrition Follow-up  DOCUMENTATION CODES:   Not applicable  INTERVENTION:  Continue Ensure Enlive po BID, each supplement provides 350 kcal and 20 grams of protein  Continue Magic cup BID with meals, each supplement provides 290 kcal and 9 grams of protein  48 hour cal ct completed, pt meeting >90% of estimated needs  No new wt this admission, will order daily weights  NUTRITION DIAGNOSIS:   Inadequate oral intake related to inability to eat as evidenced by  (PEG tube dependent for nutrition/hydration and medications s/p multiple subacute strokes with residual weakness secondary to recent bacterial meningitis). -resolved; diet advanced, eating 100% of meals on regular diet  GOAL:   Patient will meet greater than or equal to 90% of their needs -meeting with po intake of meals and supplements   MONITOR:   PO intake, Weight trends, Labs, Diet advancement, I & O's  REASON FOR ASSESSMENT:   Rounds    ASSESSMENT:   39 year old male with history of recent hospitalization for bacterial meningitis (7/24-8/14). 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 Pt presented with progressively worsening mental status, increased agitation and family reports unable to safely manage him at home.  RD unable to see pt for follow-up today. Receiving pt care this morning at first attempt, pt sitting in chair with security officers in room this afternoon at second attempt. Per flowsheets, he continues to eat 100% of most meals, noted 50% po intake of dinner meals on 11/5 as well as on 11/7. He is consuming Ensure [employer] Cup supplements BID. Per notes, mother of pt requesting PEG removed, surgery consulted.   No new weights this admission, will order weights today to assess trends.   Medications reviewed and include: Valium, Ensure, Ferrous sulfate,  Folic acid, Seroquel  No new labs for review  Diet Order:   Diet Order            Diet regular Room service appropriate? Yes; Fluid consistency: Thin  Diet effective now                 EDUCATION NEEDS:   Education needs have been addressed  Skin:  Skin Assessment: Reviewed RN Assessment  Last BM:  11/9-type 6  Height:   Ht Readings from Last 1 Encounters:  01/02/20 6\' 2"  (1.88 m)    Weight:   Wt Readings from Last 1 Encounters:  01/02/20 83.2 kg     BMI:  Body mass index is 23.55 kg/m.  Estimated Nutritional Needs:   Kcal:  2080-2330  Protein:  105-115  Fluid:  >/= 2 L/day   06-10-1970, RD, LDN Clinical Nutrition After Hours/Weekend Pager # in Amion

## 2020-01-16 DIAGNOSIS — N3001 Acute cystitis with hematuria: Secondary | ICD-10-CM | POA: Diagnosis not present

## 2020-01-16 DIAGNOSIS — R4689 Other symptoms and signs involving appearance and behavior: Secondary | ICD-10-CM | POA: Diagnosis not present

## 2020-01-16 DIAGNOSIS — I69319 Unspecified symptoms and signs involving cognitive functions following cerebral infarction: Secondary | ICD-10-CM | POA: Diagnosis not present

## 2020-01-16 DIAGNOSIS — R451 Restlessness and agitation: Secondary | ICD-10-CM | POA: Diagnosis not present

## 2020-01-16 NOTE — Progress Notes (Signed)
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 He had elevated troponins over 5000.Due to combativeness, he required sedation and subsequent intubation [facility] with [facility]. He was started on ceftriaxone. Due to possible seizure, he was started on Keppra and placed on continuous EEG which was negative for seizure activity. 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 He received a 5 day course of SoluMedrol for cerebral edema. He then developed exteensor posturing with tachycardia and hypertension concerning for elevated intracranial pressure. He underwent Bolt ICP monitoring which demonstrated no elevated ICP. Follow up CT head and subsequent repeat MRI of brain showed small subacute hemorrhage in the right suboccipital lobe, either hemorrhagic conversion of small ischemic stroke vs extension of previously seen microhemorrhage, as well as hypoxic injury involving the bilateral caudate and putamen. He also sustained a NSTEMI. He was extubated on 10/10/2019 and underwent slow taper of Dilaudid to minimize withdrawal symptoms. A G-tube was inserted on 10/16/2019 and he was discharged on 10/18/2019 under the care of his mother who is a CNA.   He has been going to outpatient PT/OT and has been getting stronger. He is still taking food and medication via G-tube. He has not been able to establish care with a  PCP yet. He is easily angry and has kicked his mother. He does not talk much and often when he speaks, it may be a curse word. He is still incontinent. He requiredassistance with essential ADLs such as bathing, dressing and using toilet.  Assessment/Plan: Acuteon chronicmetabolic encephalopathy--- worsening behavioral problems superimposed on underlying anoxic brain injury -AddedAbilify 5 mg twice daily -Continue Lamictal 100 mg daily -Keppra was recently discontinued as patient's EEG was nonepileptiformand may have been contributing to increased agitation --Lorazepam or Haldol as needed agitation -continue to gradually titrate up seroquel -I have discussed the risks/benefits of seroquel including its "black box warning" with patient's mother  -she expresses understanding in light of the patient's behavioral issues -She expresses that other medications and change of venue/scenery have been tried to try to provide care for the patient -in light of her inability to care for the patient and failure of other modalities, she accepts the risks with seroquel in hopes that his behavior can be controlled to allow for care of the patient -I suspect that his overall mental status with periods of agitation ishis usual baseline at home -continuevalium bid -11/5 increased seroquel to 150 mg bid -11/6 overall less aggression but continues to have episodes of agitation -01/11/20--restraints off for ~24 hours>>72 -overall now more re-directable and following commands; no further aggressive behavior after adjustment of medications  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  SVT/HTN -patient had intermittent episodes of SVT -controlled and resolved with the use of metoprolol -will observed on telemetry for another 24 hours for stability assessment and if stable will D/C tele.  Social/Ethics -patient's mother was a trained CNA is no longer able to manage him at home due to escalating behaviors --Patient remains total care- He is still taking food and medication via G-tube. -He is easily angry, gets agitated and may kick and punch. He does not talk much. He is still incontinent. He requires assistance with essential ADLs such as bathing, dressing and using toilet -Familydoes not feel that they can safely manage the patient at home and arerequesting placement to facility. -Social work consultation to help with placement requested  chronic anemia/iron deficiency anemia -multifactorial , partly nutritional, expect further drop in H&H with hydration/hemodilution -iron sat--9% -folate 7.1 -B12--2310 -startedon po iron and folate  presumed UTI --patient didn't meet sepsis criteria -has been treatedempirically with IV Rocephinx 3 days -No dysuria, no frank hematuria or complaints of suprapubic abdominal pain.  Left elbow pain -Patient with ongoing weakness fall out of bed on 2020-01-14 -No swelling, skin breakdown or fractures appreciated on images. -Continue to closely monitor patient's and use seated if required for safety.  Disposition/Need for in-Hospital Stay- patient unable to be discharged at this time due to -behavioral problems requiring medication adjustments, --At this time patient does not have a safe discharge plan, patient's mother unable to manage him at home, will need SNF facility -I have discussed with mother at bedside discharge options and she has expressed that given her age and physical  limitations no longer able to care for him at home. Social work has been made aware and will continue to follow on their assistance with placement.  Status is: Inpatient  Remains inpatient appropriate because:behavioral problems requiring medication adjustments   Status is: Inpatient  Remains inpatient appropriate because: Unsafe d/c plan   Dispo: The patient is from:Home Anticipated d/c is to:SNF versus group home. Anticipated d/c date is:To be determined. Patient currently is overall medically stable to d/c. Unfortunately no longer being able to be care by his mother at home due to physical limitations. From a safety standpoint will require to be placed for further care and assistance. Physical therapy has seen patient and recommended SNF for further care and rehab. TOC on board and helping with discharge plans.    Family Communication:Mother updated at bedside on 01/14/2020.  Consultants:none  Code Status: FULL   DVT Prophylaxis: [place] Lovenox   Procedures: As Listed in Progress Note Above  Antibiotics: None   Subjective: No agitation reported overnight.  Currently calm, no acute distress.  Following simple commands.  No fever, no chest pain, no nausea, no vomiting.  Objective: Vitals:   01/15/20 2109 01/16/20 0559 01/16/20 1355 01/16/20 1402  BP: (!) 103/57 107/60 115/60 115/60  Pulse: 100 (!) 102 99 96  Resp: 20 18 17 17   Temp: 98 F (36.7 C) 97.9 F (36.6 C) 98.2 F (36.8 C) 98.2 F (36.8 C)  TempSrc: Oral Oral Oral Oral  SpO2: 96% 100% 97% 98%  Weight:      Height:        Intake/Output Summary (Last 24 hours) at 01/16/2020 1645 Last data filed at 01/16/2020 1300 Gross per 24 hour  Intake 1080 ml  Output --  Net 1080 ml   Weight change:   Exam: General exam: Alert, awake, oriented x 1;, during my examination and  following simple commands.  Generalized weakness appreciated.  Per  nursing report mild agitation overnight.  Patient is afebrile.  No chest pain, no nausea, no vomiting.  Ate all his breakfast. Respiratory system: Clear to auscultation. Respiratory effort normal. Cardiovascular system:RRR. No murmurs, rubs, gallops. Gastrointestinal system: Abdomen is nondistended, soft and nontender. No organomegaly or masses felt. Normal bowel sounds heard. Central nervous system: No new focal neurological deficits. Extremities: No cyanosis or clubbing. Skin: No petechiae. Psychiatry: Mood & affect appropriate currently.    Data Reviewed: I have personally reviewed following labs and imaging studies  Basic Metabolic Panel: No results for input(s): NA, K, CL, CO2, GLUCOSE, BUN, CREATININE, CALCIUM, MG, PHOS in the last 168 hours. Liver Function Tests: No results for input(s): AST, ALT, ALKPHOS, BILITOT, PROT, ALBUMIN in the last 168 hours. CBC: No results for input(s): WBC, NEUTROABS, HGB, HCT, MCV, PLT in the last 168 hours. Urine analysis:    Component Value Date/Time   COLORURINE YELLOW 01/01/2020 2133   APPEARANCEUR HAZY (A) 01/01/2020 2133   LABSPEC 1.003 (L) 01/01/2020 2133   PHURINE 6.0 01/01/2020 2133   GLUCOSEU NEGATIVE 01/01/2020 2133   HGBUR LARGE (A) 01/01/2020 2133   BILIRUBINUR NEGATIVE 01/01/2020 2133   KETONESUR 20 (A) 01/01/2020 2133   PROTEINUR 30 (A) 01/01/2020 2133   NITRITE NEGATIVE 01/01/2020 2133   LEUKOCYTESUR LARGE (A) 01/01/2020 2133   Scheduled Meds: . ARIPiprazole  5 mg Oral BID  . diazepam  5 mg Oral Q12H  . enoxaparin (LOVENOX) injection  40 mg Subcutaneous Q24H  . feeding supplement  237 mL Oral BID BM  . ferrous sulfate  325 mg Oral BID WC  . folic acid  1 mg Oral Daily  . lamoTRIgine  100 mg Oral Daily  . metoprolol tartrate  12.5 mg Oral BID  . polyethylene glycol  17 g Oral Daily  . QUEtiapine  150 mg Per Tube BID  . senna  2 tablet Oral Daily   Continuous Infusions: . dextrose 5 % and 0.9% NaCl 100 mL/hr at  01/02/20 1025    Procedures/Studies: DG Elbow 2 Views Left  Result Date: 01/14/2020 CLINICAL DATA:  Pain status post fall EXAM: LEFT ELBOW - 2 VIEW COMPARISON:  None. FINDINGS: There is no evidence of fracture, dislocation, or joint effusion. There is no evidence of arthropathy or other focal bone abnormality. Soft tissues are unremarkable. IMPRESSION: Negative. Electronically Signed   By: Katherine Mantle M.D.   On: 01/14/2020 19:31   CT Head Wo Contrast  Result Date: 01/01/2020 CLINICAL DATA:  Altered mental status. EXAM: CT HEAD WITHOUT CONTRAST TECHNIQUE: Contiguous axial images were obtained from the base of the skull through the vertex without intravenous contrast. COMPARISON:  December 28, 2019 FINDINGS: Brain: There is mild cerebral atrophy with widening of the extra-axial spaces and ventricular dilatation. There are areas of decreased attenuation within the white matter tracts of the supratentorial brain, consistent with microvascular disease changes. Vascular: No hyperdense vessel or unexpected calcification. Skull: Normal. Negative for fracture or focal lesion. Sinuses/Orbits: A 1.2 cm x 1.1 cm left maxillary sinus polyp versus mucous retention cyst is seen. Other: None. IMPRESSION: 1. Generalized cerebral atrophy. 2. No acute intracranial abnormality. 3. Left maxillary sinus polyp versus mucous retention cyst. Electronically Signed   By: Aram Candela M.D.   On: 01/01/2020 21:33   CT Angio Chest PE W/Cm &/Or Wo Cm  Result Date: 01/01/2020 CLINICAL DATA:  Abnormal chest x-ray and tachycardia EXAM: CT ANGIOGRAPHY CHEST WITH CONTRAST  TECHNIQUE: Multidetector CT imaging of the chest was performed using the standard protocol during bolus administration of intravenous contrast. Multiplanar CT image reconstructions and MIPs were obtained to evaluate the vascular anatomy. CONTRAST:  OMNIPAQUE IOHEXOL 350 MG/ML SOLN COMPARISON:  Radiograph same day FINDINGS: Cardiovascular: There is a  optimal opacification of the pulmonary arteries. There is no central,segmental, or subsegmental filling defects within the pulmonary arteries. The heart is normal in size. No pericardial effusion or thickening. No evidence right heart strain. There is normal three-vessel brachiocephalic anatomy without proximal stenosis. The thoracic aorta is normal in appearance. Mediastinum/Nodes: No hilar, mediastinal, or axillary adenopathy. Thyroid gland, trachea, and esophagus demonstrate no significant findings. Lungs/Pleura: The lungs are clear. No pleural effusion or pneumothorax. No airspace consolidation. Upper Abdomen: No acute abnormalities present in the visualized portions of the upper abdomen. Percutaneous gastrotomy tube seen within the mid body of the stomach. Musculoskeletal: No chest wall abnormality. No acute or significant osseous findings. Review of the MIP images confirms the above findings. IMPRESSION: No central, segmental, or subsegmental pulmonary embolism. No other acute intrathoracic pathology to explain the patient's symptoms. Electronically Signed   By: Jonna Clark M.D.   On: 01/01/2020 21:33   DG Chest Portable 1 View  Result Date: 01/01/2020 CLINICAL DATA:  Increased agitation for 4 days, history of brain injury EXAM: PORTABLE CHEST 1 VIEW COMPARISON:  Portable exam 1550 hours compared to 12/28/2019 and 09/28/2019 FINDINGS: Normal heart size, mediastinal contours, and pulmonary vascularity. New somewhat focal but ill-defined opacity in the RIGHT mid lung question pneumonia versus superimposed artifact. Remaining lungs clear. No pleural effusion or pneumothorax. Osseous structures unremarkable. IMPRESSION: New somewhat focal but ill-defined opacity in RIGHT mid lung question pneumonia; this was not seen on the prior study of 12/28/2019 nor on an earlier exam from 09/28/2019. Radiographic follow-up until resolution recommended to exclude pulmonary nodule. Electronically Signed   By: Ulyses Southward  M.D.   On: 01/01/2020 16:05    Vassie Loll, MD  [facility]  If 7PM-7AM, please contact night-coverage [email or web address] Password [email or web address] 01/16/2020, 4:45 PM   LOS: 15 days

## 2020-01-16 NOTE — TOC Progression Note (Signed)
Transition of Care [facility]) - Progression Note    Patient Details  Name: Isaac [name] MRN: [id number] Date of Birth: 07/12/1980  [facility] CM/SW Contact  Elliot Gault, LCSW Phone Number: 01/16/2020, 12:38 PM  Clinical Narrative:      TOC following. Continuing to attempt SNF long term placement. TOC supervisor assisting as so far there are no facilities offering a bed to patient.  TOC will follow.   Barriers to Discharge: No SNF bed  Expected Discharge Plan and Services                                                 Social Determinants of Health (SDOH) Interventions    Readmission Risk Interventions No flowsheet data found.

## 2020-01-17 DIAGNOSIS — R4689 Other symptoms and signs involving appearance and behavior: Secondary | ICD-10-CM | POA: Diagnosis not present

## 2020-01-17 DIAGNOSIS — N3001 Acute cystitis with hematuria: Secondary | ICD-10-CM | POA: Diagnosis not present

## 2020-01-17 DIAGNOSIS — I69319 Unspecified symptoms and signs involving cognitive functions following cerebral infarction: Secondary | ICD-10-CM | POA: Diagnosis not present

## 2020-01-17 DIAGNOSIS — R451 Restlessness and agitation: Secondary | ICD-10-CM | POA: Diagnosis not present

## 2020-01-17 NOTE — Progress Notes (Signed)
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 He had elevated troponins over 5000.Due to combativeness, he required sedation and subsequent intubation [facility] with [facility]. He was started on ceftriaxone. Due to possible seizure, he was started on Keppra and placed on continuous EEG which was negative for seizure activity. 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 He received a 5 day course of SoluMedrol for cerebral edema. He then developed exteensor posturing with tachycardia and hypertension concerning for elevated intracranial pressure. He underwent Bolt ICP monitoring which demonstrated no elevated ICP. Follow up CT head and subsequent repeat MRI of brain showed small subacute hemorrhage in the right suboccipital lobe, either hemorrhagic conversion of small ischemic stroke vs extension of previously seen microhemorrhage, as well as hypoxic injury involving the bilateral caudate and putamen. He also sustained a NSTEMI. He was extubated on 10/10/2019 and underwent slow taper of Dilaudid to minimize withdrawal symptoms. A G-tube was inserted on 10/16/2019 and he was discharged on 10/18/2019 under the care of his mother who is a CNA.   He has been going to outpatient PT/OT and has been getting stronger. He is still taking food and medication via G-tube. He has not been able to establish care with a  PCP yet. He is easily angry and has kicked his mother. He does not talk much and often when he speaks, it may be a curse word. He is still incontinent. He requiredassistance with essential ADLs such as bathing, dressing and using toilet.  Assessment/Plan: Acuteon chronicmetabolic encephalopathy--- worsening behavioral problems superimposed on underlying anoxic brain injury -AddedAbilify 5 mg twice daily -Continue Lamictal 100 mg daily -Keppra was recently discontinued as patient's EEG was nonepileptiformand may have been contributing to increased agitation --Lorazepam or Haldol as needed agitation -continue to gradually titrate up seroquel -I have discussed the risks/benefits of seroquel including its "black box warning" with patient's mother  -she expresses understanding in light of the patient's behavioral issues -She expresses that other medications and change of venue/scenery have been tried to try to provide care for the patient -in light of her inability to care for the patient and failure of other modalities, she accepts the risks with seroquel in hopes that his behavior can be controlled to allow for care of the patient -I suspect that his overall mental status with periods of agitation ishis usual baseline at home -continuevalium bid -11/5 increased seroquel to 150 mg bid -11/6 overall less aggression but continues to have episodes of agitation -01/11/20--restraints off for ~24 hours>>72 -overall now more re-directable and following commands; no further aggressive behavior after adjustment of medications  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  SVT/HTN -patient had intermittent episodes of SVT -controlled and resolved with the use of metoprolol -will observed on telemetry for another 24 hours for stability assessment and if stable will D/C tele.  Social/Ethics -patient's mother was a trained CNA is no longer able to manage him at home due to escalating behaviors --Patient remains total care- He is still taking food and medication via G-tube. -He is easily angry, gets agitated and may kick and punch. He does not talk much. He is still incontinent. He requires assistance with essential ADLs such as bathing, dressing and using toilet -Familydoes not feel that they can safely manage the patient at home and arerequesting placement to facility. -Social work consultation to help with placement requested  chronic anemia/iron deficiency anemia -multifactorial , partly nutritional, expect further drop in H&H with hydration/hemodilution -iron sat--9% -folate 7.1 -B12--2310 -startedon po iron and folate  presumed UTI --patient didn't meet sepsis criteria -has been treatedempirically with IV Rocephinx 3 days -No dysuria, no frank hematuria or complaints of suprapubic abdominal pain.  Left elbow pain -Patient with ongoing weakness fall out of bed on 2020-01-14 -No swelling, skin breakdown or fractures appreciated on images. -Continue to closely monitor patient's and use seated if required for safety.  Disposition/Need for in-Hospital Stay- patient unable to be discharged at this time due to -behavioral problems requiring medication adjustments, --At this time patient does not have a safe discharge plan, patient's mother unable to manage him at home, will need SNF facility -I have discussed with mother at bedside discharge options and she has expressed that given her age and physical  limitations no longer able to care for him at home. Social work has been made aware and will continue to follow on their assistance with placement.  Status is: Inpatient  Remains inpatient appropriate because:behavioral problems requiring medication adjustments   Status is: Inpatient  Remains inpatient appropriate because: Unsafe d/c plan   Dispo: The patient is from:Home Anticipated d/c is to:SNF versus group home. Anticipated d/c date is:To be determined. Patient currently is overall medically stable to d/c. Unfortunately no longer being able to be care by his mother at home due to physical limitations. From a safety standpoint will require to be placed for further care and assistance. Physical therapy has seen patient and recommended SNF for further care and rehab. TOC on board and helping with discharge plans.    Family Communication:Mother updated at bedside on 01/14/2020.  Consultants:none  Code Status: FULL   DVT Prophylaxis: [place] Lovenox   Procedures: As Listed in Progress Note Above  Antibiotics: None   Subjective: Intermittent episode of agitation has been described; no chest pain, no nausea, no vomiting, no fever.  Generalized weakness deconditioning and high risk for falls remains present.  Objective: Vitals:   01/16/20 2024 01/16/20 2216 01/16/20 2217 01/17/20 0500  BP:  125/85 125/85 115/74  Pulse:  83 90 79  Resp:   20 20  Temp:   97.6 F (36.4 C) 97.9 F (36.6 C)  TempSrc:   Oral Axillary  SpO2: 99%  100% 100%  Weight:    85.7 kg  Height:       No intake or output data in the 24 hours ending 01/17/20 1755 Weight change:   Exam: General exam: Alert, awake, oriented x 1; patient having intermittent episode of agitation and today and requiring security to be called for no following commands and to stay on bed when he was dangerous for  him to get out without supervision and  assistance.  No chest pain, no nausea, no vomiting, no fever. Respiratory system: Clear to auscultation. Respiratory effort normal. Cardiovascular system:RRR. No murmurs, rubs, gallops. Gastrointestinal system: Abdomen is nondistended, soft and nontender. No organomegaly or masses felt. Normal bowel sounds heard. Central nervous system: No new focal neurological deficits. Extremities: No cyanosis or clubbing. Skin: No petechiae. Psychiatry: Calm and in no major distress during my exam.   Data Reviewed: I have personally reviewed following labs and imaging studies  Urine analysis:    Component Value Date/Time   COLORURINE YELLOW 01/01/2020 2133   APPEARANCEUR HAZY (A) 01/01/2020 2133   LABSPEC 1.003 (L) 01/01/2020 2133   PHURINE 6.0 01/01/2020 2133   GLUCOSEU NEGATIVE 01/01/2020 2133   HGBUR LARGE (A) 01/01/2020 2133   BILIRUBINUR NEGATIVE 01/01/2020 2133   KETONESUR 20 (A) 01/01/2020 2133   PROTEINUR 30 (A) 01/01/2020 2133   NITRITE NEGATIVE 01/01/2020 2133   LEUKOCYTESUR LARGE (A) 01/01/2020 2133   Scheduled Meds: . ARIPiprazole  5 mg Oral BID  . diazepam  5 mg Oral Q12H  . enoxaparin (LOVENOX) injection  40 mg Subcutaneous Q24H  . feeding supplement  237 mL Oral BID BM  . ferrous sulfate  325 mg Oral BID WC  . folic acid  1 mg Oral Daily  . lamoTRIgine  100 mg Oral Daily  . metoprolol tartrate  12.5 mg Oral BID  . polyethylene glycol  17 g Oral Daily  . QUEtiapine  150 mg Per Tube BID  . senna  2 tablet Oral Daily   Continuous Infusions: . dextrose 5 % and 0.9% NaCl 100 mL/hr at 01/02/20 0938    Procedures/Studies: DG Elbow 2 Views Left  Result Date: 01/14/2020 CLINICAL DATA:  Pain status post fall EXAM: LEFT ELBOW - 2 VIEW COMPARISON:  None. FINDINGS: There is no evidence of fracture, dislocation, or joint effusion. There is no evidence of arthropathy or other focal bone abnormality. Soft tissues are unremarkable. IMPRESSION: Negative. Electronically Signed   By:  Katherine Mantle M.D.   On: 01/14/2020 19:31   CT Head Wo Contrast  Result Date: 01/01/2020 CLINICAL DATA:  Altered mental status. EXAM: CT HEAD WITHOUT CONTRAST TECHNIQUE: Contiguous axial images were obtained from the base of the skull through the vertex without intravenous contrast. COMPARISON:  December 28, 2019 FINDINGS: Brain: There is mild cerebral atrophy with widening of the extra-axial spaces and ventricular dilatation. There are areas of decreased attenuation within the white matter tracts of the supratentorial brain, consistent with microvascular disease changes. Vascular: No hyperdense vessel or unexpected calcification. Skull: Normal. Negative for fracture or focal lesion. Sinuses/Orbits: A 1.2 cm x 1.1 cm left maxillary sinus polyp versus mucous retention cyst is seen. Other: None. IMPRESSION: 1. Generalized cerebral atrophy. 2. No acute intracranial abnormality. 3. Left maxillary sinus polyp versus mucous retention cyst. Electronically Signed   By: Aram Candela M.D.   On: 01/01/2020 21:33   CT Angio Chest PE W/Cm &/Or Wo Cm  Result Date: 01/01/2020 CLINICAL DATA:  Abnormal chest x-ray and tachycardia EXAM: CT ANGIOGRAPHY CHEST WITH CONTRAST TECHNIQUE: Multidetector CT imaging of the chest was performed using the standard protocol during bolus administration of intravenous contrast. Multiplanar CT image reconstructions and MIPs were obtained to evaluate the vascular anatomy. CONTRAST:  OMNIPAQUE IOHEXOL 350 MG/ML SOLN COMPARISON:  Radiograph same day FINDINGS: Cardiovascular: There is a optimal opacification of the pulmonary arteries. There is no central,segmental, or subsegmental filling defects within the  pulmonary arteries. The heart is normal in size. No pericardial effusion or thickening. No evidence right heart strain. There is normal three-vessel brachiocephalic anatomy without proximal stenosis. The thoracic aorta is normal in appearance. Mediastinum/Nodes: No hilar,  mediastinal, or axillary adenopathy. Thyroid gland, trachea, and esophagus demonstrate no significant findings. Lungs/Pleura: The lungs are clear. No pleural effusion or pneumothorax. No airspace consolidation. Upper Abdomen: No acute abnormalities present in the visualized portions of the upper abdomen. Percutaneous gastrotomy tube seen within the mid body of the stomach. Musculoskeletal: No chest wall abnormality. No acute or significant osseous findings. Review of the MIP images confirms the above findings. IMPRESSION: No central, segmental, or subsegmental pulmonary embolism. No other acute intrathoracic pathology to explain the patient's symptoms. Electronically Signed   By: Jonna Clark M.D.   On: 01/01/2020 21:33   DG Chest Portable 1 View  Result Date: 01/01/2020 CLINICAL DATA:  Increased agitation for 4 days, history of brain injury EXAM: PORTABLE CHEST 1 VIEW COMPARISON:  Portable exam 1550 hours compared to 12/28/2019 and 09/28/2019 FINDINGS: Normal heart size, mediastinal contours, and pulmonary vascularity. New somewhat focal but ill-defined opacity in the RIGHT mid lung question pneumonia versus superimposed artifact. Remaining lungs clear. No pleural effusion or pneumothorax. Osseous structures unremarkable. IMPRESSION: New somewhat focal but ill-defined opacity in RIGHT mid lung question pneumonia; this was not seen on the prior study of 12/28/2019 nor on an earlier exam from 09/28/2019. Radiographic follow-up until resolution recommended to exclude pulmonary nodule. Electronically Signed   By: Ulyses Southward M.D.   On: 01/01/2020 16:05    Vassie Loll, MD  [facility]  If 7PM-7AM, please contact night-coverage [email or web address] Password [email or web address] 01/17/2020, 5:55 PM   LOS: 16 days

## 2020-01-18 DIAGNOSIS — N3001 Acute cystitis with hematuria: Secondary | ICD-10-CM | POA: Diagnosis not present

## 2020-01-18 DIAGNOSIS — R451 Restlessness and agitation: Secondary | ICD-10-CM | POA: Diagnosis not present

## 2020-01-18 DIAGNOSIS — I69319 Unspecified symptoms and signs involving cognitive functions following cerebral infarction: Secondary | ICD-10-CM | POA: Diagnosis not present

## 2020-01-18 DIAGNOSIS — R4689 Other symptoms and signs involving appearance and behavior: Secondary | ICD-10-CM | POA: Diagnosis not present

## 2020-01-18 NOTE — Progress Notes (Signed)
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 He had elevated troponins over 5000.Due to combativeness, he required sedation and subsequent intubation [facility] and [facility] with pneum[facility]. He was started on ceftriaxone. Due to possible seizure, he was started on Keppra and placed on continuous EEG which was negative for seizure activity. 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 He received a 5 day course of SoluMedrol for cerebral edema. He then developed exteensor posturing with tachycardia and hypertension concerning for elevated intracranial pressure. He underwent Bolt ICP monitoring which demonstrated no elevated ICP. Follow up CT head and subsequent repeat MRI of brain showed small subacute hemorrhage in the right suboccipital lobe, either hemorrhagic conversion of small ischemic stroke vs extension of previously seen microhemorrhage, as well as hypoxic injury involving the bilateral caudate and putamen. He also sustained a NSTEMI. He was extubated on 10/10/2019 and underwent slow taper of Dilaudid to minimize withdrawal symptoms. A G-tube was inserted on 10/16/2019 and he was discharged on 10/18/2019 under the care of his mother who is a CNA.   He has been going to outpatient PT/OT and has been getting stronger. He is still taking food and medication via G-tube. He has not been able to establish care with a  PCP yet. He is easily angry and has kicked his mother. He does not talk much and often when he speaks, it may be a curse word. He is still incontinent. He requiredassistance with essential ADLs such as bathing, dressing and using toilet.  Assessment/Plan: Acuteon chronicmetabolic encephalopathy--- worsening behavioral problems superimposed on underlying anoxic brain injury -AddedAbilify 5 mg twice daily -Continue Lamictal 100 mg daily -Keppra was recently discontinued as patient's EEG was nonepileptiformand may have been contributing to increased agitation --Lorazepam or Haldol as needed agitation -continue to gradually titrate up seroquel -I have discussed the risks/benefits of seroquel including its "black box warning" with patient's mother  -she expresses understanding in light of the patient's behavioral issues -She expresses that other medications and change of venue/scenery have been tried to try to provide care for the patient -in light of her inability to care for the patient and failure of other modalities, she accepts the risks with seroquel in hopes that his behavior can be controlled to allow for care of the patient -I suspect that his overall mental status with periods of agitation ishis usual baseline at home -continuevalium bid -11/5 increased seroquel to 150 mg bid -11/6 overall less aggression but continues to have episodes of agitation -01/11/20--restraints off for ~24 hours>>72 -overall now more re-directable and following commands; no further aggressive behavior after adjustment of medications  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  SVT/HTN -patient had intermittent episodes of SVT -controlled and resolved with the use of metoprolol -will observed on telemetry for another 24 hours for stability assessment and if stable will D/C tele.  Social/Ethics -patient's mother was a trained CNA is no longer able to manage him at home due to escalating behaviors --Patient remains total care- He is still taking food and medication via G-tube. -He is easily angry, gets agitated and may kick and punch. He does not talk much. He is still incontinent. He requires assistance with essential ADLs such as bathing, dressing and using toilet -Familydoes not feel that they can safely manage the patient at home and arerequesting placement to facility. -Social work consultation to help with placement requested  chronic anemia/iron deficiency anemia -multifactorial , partly nutritional, expect further drop in H&H with hydration/hemodilution -iron sat--9% -folate 7.1 -B12--2310 -startedon po iron and folate  presumed UTI --patient didn't meet sepsis criteria -has been treatedempirically with IV Rocephinx 3 days -No dysuria, no frank hematuria or complaints of suprapubic abdominal pain.  Left elbow pain -Patient with ongoing weakness fall out of bed on 2020-01-14 -No swelling, skin breakdown or fractures appreciated on images. -Continue to closely monitor patient's and use seated if required for safety.  Disposition/Need for in-Hospital Stay- patient unable to be discharged at this time due to -behavioral problems requiring medication adjustments, --At this time patient does not have a safe discharge plan, patient's mother unable to manage him at home, will need SNF facility -I have discussed with mother at bedside discharge options and she has expressed that given her age and physical  limitations no longer able to care for him at home. Social work has been made aware and will continue to follow on their assistance with placement.  Status is: Inpatient  Remains inpatient appropriate because:behavioral problems requiring medication adjustments   Status is: Inpatient  Remains inpatient appropriate because: Unsafe d/c plan   Dispo: The patient is from:Home Anticipated d/c is to:SNF versus group home. Anticipated d/c date is:To be determined. Patient currently is overall medically stable to d/c. Unfortunately no longer being able to be care by his mother at home due to physical limitations. From a safety standpoint will require to be placed for further care and assistance. Physical therapy has seen patient and recommended SNF for further care and rehab. TOC on board and helping with discharge plans.    Family Communication:Mother updated at bedside on 01/14/2020.  Consultants:none  Code Status: FULL   DVT Prophylaxis: [place] Lovenox   Procedures: As Listed in Progress Note Above  Antibiotics: None   Subjective: In no acute distress currently; no chest pain, no nausea, no vomiting.  Calmed.   Objective: Vitals:   01/17/20 0500 01/17/20 2318 01/18/20 0203 01/18/20 0633  BP: 115/74 118/68  122/84  Pulse: 79 71  73  Resp: 20 20  18   Temp: 97.9 F (36.6 C)     TempSrc: Axillary     SpO2: 100%     Weight: 85.7 kg  85.7 kg   Height:       No intake or output data in the 24 hours ending 01/18/20 0928 Weight change: 0 kg  Exam: General exam: Alert, awake, oriented x 1; in no acute distress currently.  To have intermittent episode of agitation.  Patient is afebrile, no chest pain, no shortness of breath, no nausea, no vomiting. Respiratory system: Clear to auscultation. Respiratory effort normal. Cardiovascular system:RRR. No murmurs, rubs, gallops.  Gastrointestinal system: Abdomen is  nondistended, soft and nontender. No organomegaly or masses felt. Normal bowel sounds heard. Central nervous system: No new focal neurological deficits. Extremities: No cyanosis or clubbing Skin: No petechiae. Psychiatry: Mood & affect stable currently.     Data Reviewed: I have personally reviewed following labs and imaging studies  Urine analysis:    Component Value Date/Time   COLORURINE YELLOW 01/01/2020 2133   APPEARANCEUR HAZY (A) 01/01/2020 2133   LABSPEC 1.003 (L) 01/01/2020 2133   PHURINE 6.0 01/01/2020 2133   GLUCOSEU NEGATIVE 01/01/2020 2133   HGBUR LARGE (A) 01/01/2020 2133   BILIRUBINUR NEGATIVE 01/01/2020 2133   KETONESUR 20 (A) 01/01/2020 2133   PROTEINUR 30 (A) 01/01/2020 2133   NITRITE NEGATIVE 01/01/2020 2133   LEUKOCYTESUR LARGE (A) 01/01/2020 2133   Scheduled Meds: . ARIPiprazole  5 mg Oral BID  . diazepam  5 mg Oral Q12H  . enoxaparin (LOVENOX) injection  40 mg Subcutaneous Q24H  . feeding supplement  237 mL Oral BID BM  . ferrous sulfate  325 mg Oral BID WC  . folic acid  1 mg Oral Daily  . lamoTRIgine  100 mg Oral Daily  . metoprolol tartrate  12.5 mg Oral BID  . polyethylene glycol  17 g Oral Daily  . QUEtiapine  150 mg Per Tube BID  . senna  2 tablet Oral Daily   Continuous Infusions: . dextrose 5 % and 0.9% NaCl 100 mL/hr at 01/02/20 [id number]    Procedures/Studies: DG Elbow 2 Views Left  Result Date: 01/14/2020 CLINICAL DATA:  Pain status post fall EXAM: LEFT ELBOW - 2 VIEW COMPARISON:  None. FINDINGS: There is no evidence of fracture, dislocation, or joint effusion. There is no evidence of arthropathy or other focal bone abnormality. Soft tissues are unremarkable. IMPRESSION: Negative. Electronically Signed   By: Katherine Mantlehristopher  Green M.D.   On: 01/14/2020 19:31   CT Head Wo Contrast  Result Date: 01/01/2020 CLINICAL DATA:  Altered mental status. EXAM: CT HEAD WITHOUT CONTRAST TECHNIQUE: Contiguous axial images were obtained from the base of the  skull through the vertex without intravenous contrast. COMPARISON:  December 28, 2019 FINDINGS: Brain: There is mild cerebral atrophy with widening of the extra-axial spaces and ventricular dilatation. There are areas of decreased attenuation within the white matter tracts of the supratentorial brain, consistent with microvascular disease changes. Vascular: No hyperdense vessel or unexpected calcification. Skull: Normal. Negative for fracture or focal lesion. Sinuses/Orbits: A 1.2 cm x 1.1 cm left maxillary sinus polyp versus mucous retention cyst is seen. Other: None. IMPRESSION: 1. Generalized cerebral atrophy. 2. No acute intracranial abnormality. 3. Left maxillary sinus polyp versus mucous retention cyst. Electronically Signed   By: Aram Candelahaddeus  Houston M.D.   On: 01/01/2020 21:33   CT Angio Chest PE W/Cm &/Or Wo Cm  Result Date: 01/01/2020 CLINICAL DATA:  Abnormal chest x-ray and tachycardia EXAM: CT ANGIOGRAPHY CHEST WITH CONTRAST TECHNIQUE: Multidetector CT imaging of the chest was performed using the standard protocol during bolus administration of intravenous contrast. Multiplanar CT image reconstructions and MIPs were obtained to evaluate the vascular anatomy. CONTRAST:  100mL OMNIPAQUE IOHEXOL 350 MG/ML SOLN COMPARISON:  Radiograph same day FINDINGS: Cardiovascular: There is a optimal opacification of the pulmonary arteries. There is no central,segmental, or subsegmental filling defects within the pulmonary arteries. The heart is normal in size. No pericardial effusion or thickening. No evidence right heart strain. There is normal three-vessel brachiocephalic anatomy without proximal stenosis. The thoracic aorta is normal in appearance.  Mediastinum/Nodes: No hilar, mediastinal, or axillary adenopathy. Thyroid gland, trachea, and esophagus demonstrate no significant findings. Lungs/Pleura: The lungs are clear. No pleural effusion or pneumothorax. No airspace consolidation. Upper Abdomen: No acute  abnormalities present in the visualized portions of the upper abdomen. Percutaneous gastrotomy tube seen within the mid body of the stomach. Musculoskeletal: No chest wall abnormality. No acute or significant osseous findings. Review of the MIP images confirms the above findings. IMPRESSION: No central, segmental, or subsegmental pulmonary embolism. No other acute intrathoracic pathology to explain the patient's symptoms. Electronically Signed   By: Jonna Clark M.D.   On: 01/01/2020 21:33   DG Chest Portable 1 View  Result Date: 01/01/2020 CLINICAL DATA:  Increased agitation for 4 days, history of brain injury EXAM: PORTABLE CHEST 1 VIEW COMPARISON:  Portable exam 1550 hours compared to 12/28/2019 and 09/28/2019 FINDINGS: Normal heart size, mediastinal contours, and pulmonary vascularity. New somewhat focal but ill-defined opacity in the RIGHT mid lung question pneumonia versus superimposed artifact. Remaining lungs clear. No pleural effusion or pneumothorax. Osseous structures unremarkable. IMPRESSION: New somewhat focal but ill-defined opacity in RIGHT mid lung question pneumonia; this was not seen on the prior study of 12/28/2019 nor on an earlier exam from 09/28/2019. Radiographic follow-up until resolution recommended to exclude pulmonary nodule. Electronically Signed   By: Ulyses Southward M.D.   On: 01/01/2020 16:05    Vassie Loll, MD  [facility]  If 7PM-7AM, please contact night-coverage [email or web address] Password [email or web address] 01/18/2020, 9:28 AM   LOS: 17 days

## 2020-01-19 ENCOUNTER — Ambulatory Visit: Payer: Self-pay | Admitting: Neurology

## 2020-01-19 ENCOUNTER — Encounter (HOSPITAL_COMMUNITY): Payer: Self-pay | Admitting: Internal Medicine

## 2020-01-19 DIAGNOSIS — I69319 Unspecified symptoms and signs involving cognitive functions following cerebral infarction: Secondary | ICD-10-CM | POA: Diagnosis not present

## 2020-01-19 DIAGNOSIS — N3001 Acute cystitis with hematuria: Secondary | ICD-10-CM | POA: Diagnosis not present

## 2020-01-19 DIAGNOSIS — R451 Restlessness and agitation: Secondary | ICD-10-CM | POA: Diagnosis not present

## 2020-01-19 DIAGNOSIS — Z931 Gastrostomy status: Secondary | ICD-10-CM | POA: Diagnosis not present

## 2020-01-19 DIAGNOSIS — R4689 Other symptoms and signs involving appearance and behavior: Secondary | ICD-10-CM | POA: Diagnosis not present

## 2020-01-19 NOTE — Progress Notes (Signed)
Physical Therapy Treatment Patient Details Name: Isaac [name] MRN: [id number] DOB: [birth year] Today's Date: 01/19/2020    History of Present Illness Isaac [name] is a 39 y.o. male with medical history significant of history of bacterial meningitis about 3 months ago, NSTEMI due to demand ischemia during his meningitis hospitalization (see EDP note for attached discharge summary) who is brought to the emergency department by family members due to the patient's worsening mental status.  The patient's mental status has been progressively worse.  He was seen at [facility] about 4 days ago and given Geodon due to restlessness, but subsequently discharged home.  His mother has been having a very difficult time taking care of him at home.  Neurology has been trying to assist in long-term placement, but the patient is uninsured and his mother is unable to afford it.  Neurology recently discontinue Keppra and started on Lamictal to control his agitation.  He has also been given Seroquel without significant results.    PT Comments    Patient requires repeated verbal/tactile cueing to participate with therapy, easily agitated and limited to standing at bedside due to BLE weakness, very unsteady on feet requiring 2 person assist to complete sit to stand mostly due to poor carryover for using RW, tolerated standing for approximately 6-8 minutes while linen changed on bed and put back to bed after therapy.  Patient will benefit from continued physical therapy in hospital and recommended venue below to increase strength, balance, endurance for safe ADLs and gait.   Follow Up Recommendations  SNF     Equipment Recommendations  Other (comment) (to be determined)    Recommendations for Other Services       Precautions / Restrictions Precautions Precautions: Fall Restrictions Weight Bearing Restrictions: No    Mobility  Bed Mobility Overal bed mobility: Needs Assistance Bed Mobility: Supine to  Sit;Sit to Supine     Supine to sit: Min assist;Mod assist Sit to supine: Mod assist   General bed mobility comments: slow labored movement, frequent verbal/tactile cueing in order to follow directions  Transfers Overall transfer level: Needs assistance Equipment used: 2 person hand held assist Transfers: Sit to/from Stand Sit to Stand: Mod assist;+2 physical assistance         General transfer comment: required 2 person hand held assist mostly due to having to constantly redirect patient to tasks and BLE weakness  Ambulation/Gait                 Stairs             Wheelchair Mobility    Modified Rankin (Stroke Patients Only)       Balance Overall balance assessment: Needs assistance Sitting-balance support: Feet supported;No upper extremity supported Sitting balance-Leahy Scale: Fair Sitting balance - Comments: seated at EOB   Standing balance support: During functional activity;Bilateral upper extremity supported Standing balance-Leahy Scale: Poor Standing balance comment: fair/poor with 2 person hand held assist                            Cognition Arousal/Alertness: Awake/alert Behavior During Therapy: Impulsive;Restless Overall Cognitive Status: History of cognitive impairments - at baseline                                        Exercises      General Comments  Pertinent Vitals/Pain Pain Assessment: No/denies pain    Home Living                      Prior Function            PT Goals (current goals can now be found in the care plan section) Acute Rehab PT Goals Patient Stated Goal: not stated PT Goal Formulation: With patient/family Time For Goal Achievement: 01/29/20 Potential to Achieve Goals: Fair Progress towards PT goals: Progressing toward goals    Frequency    Min 3X/week      PT Plan Current plan remains appropriate    Co-evaluation              AM-PAC PT "6  Clicks" Mobility   Outcome Measure  Help needed turning from your back to your side while in a flat bed without using bedrails?: A Little Help needed moving from lying on your back to sitting on the side of a flat bed without using bedrails?: A Little Help needed moving to and from a bed to a chair (including a wheelchair)?: A Lot Help needed standing up from a chair using your arms (e.g., wheelchair or bedside chair)?: A Lot Help needed to walk in hospital room?: A Lot Help needed climbing 3-5 steps with a railing? : Total 6 Click Score: 13    End of Session   Activity Tolerance: Patient tolerated treatment well;Patient limited by fatigue;Treatment limited secondary to agitation Patient left: in bed;with call bell/phone within reach Nurse Communication: Mobility status PT Visit Diagnosis: Unsteadiness on feet (R26.81);Other abnormalities of gait and mobility (R26.89);Muscle weakness (generalized) (M62.81)     Time: 2633-3545 PT Time Calculation (min) (ACUTE ONLY): 30 min  Charges:  $Therapeutic Activity: 23-37 mins                     3:26 PM, 01/19/20 Ocie Bob, MPT Physical Therapist with [facility] [phone] office [phone] mobile phone

## 2020-01-19 NOTE — Progress Notes (Signed)
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 He had elevated troponins over 5000.Due to combativeness, he required sedation and subsequent intubation [facility] and diagnosed with pneumococcal meningitis. He was started on ceftriaxone. Due to possible seizure, he was started on Keppra and placed on continuous EEG which was negative for seizure activity. 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 He received a 5 day course of SoluMedrol for cerebral edema. He then developed exteensor posturing with tachycardia and hypertension concerning for elevated intracranial pressure. He underwent Bolt ICP monitoring which demonstrated no elevated ICP. Follow up CT head and subsequent repeat MRI of brain showed small subacute hemorrhage in the right suboccipital lobe, either hemorrhagic conversion of small ischemic stroke vs extension of previously seen microhemorrhage, as well as hypoxic injury involving the bilateral caudate and putamen. He also sustained a NSTEMI. He was extubated on 10/10/2019 and underwent slow taper of Dilaudid to minimize withdrawal symptoms. A G-tube was inserted on 10/16/2019 and he was discharged on 10/18/2019 under the care of his mother who is a CNA.   He has been going to outpatient PT/OT and has been getting stronger. He is still taking food and medication via G-tube. He has not been able to establish care with a  PCP yet. He is easily angry and has kicked his mother. He does not talk much and often when he speaks, it may be a curse word. He is still incontinent. He requiredassistance with essential ADLs such as bathing, dressing and using toilet.  Assessment/Plan: Acuteon chronicmetabolic encephalopathy--- worsening behavioral problems superimposed on underlying anoxic brain injury -AddedAbilify 5 mg twice daily -Continue Lamictal 100 mg daily -Keppra was recently discontinued as patient's EEG was nonepileptiformand may have been contributing to increased agitation --Lorazepam or Haldol as needed agitation -continue to gradually titrate up seroquel -I have discussed the risks/benefits of seroquel including its "black box warning" with patient's mother  -she expresses understanding in light of the patient's behavioral issues -She expresses that other medications and change of venue/scenery have been tried to try to provide care for the patient -in light of her inability to care for the patient and failure of other modalities, she accepts the risks with seroquel in hopes that his behavior can be controlled to allow for care of the patient -I suspect that his overall mental status with periods of agitation ishis usual baseline at home -continuevalium bid -11/5 increased seroquel to 150 mg bid -11/6 overall less aggression but continues to have episodes of agitation -01/11/20--restraints off for ~24 hours>>72 -overall now more re-directable and following commands; no further aggressive behavior after adjustment of medications  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 -will continue to follow response.  SVT/HTN -patient had intermittent episodes of SVT -controlled and resolved with the use of metoprolol -will observed on telemetry for another 24 hours for stability assessment and if stable will D/C tele.  Social/Ethics -patient's mother was a trained CNA is no longer able to manage him at home due to escalating behaviors --Patient remains total care- He is still taking food and medication via G-tube. -He is easily angry, gets agitated and may kick and punch. He does not talk much. He is still incontinent. He requires assistance with essential ADLs such as bathing, dressing and using toilet -Familydoes not feel that they can safely manage the patient at home and arerequesting placement to facility. -Social work consultation to help with placement requested  chronic anemia/iron deficiency anemia -multifactorial , partly nutritional, expect further drop in H&H with hydration/hemodilution -iron sat--9% -folate 7.1 -B12--2310 -startedon po iron and folate  presumed UTI --patient didn't meet sepsis criteria -has been treatedempirically with IV Rocephinx 3 days -No dysuria, no frank hematuria or complaints of suprapubic abdominal pain.  Left elbow pain -Patient with ongoing weakness fall out of bed on 2020-01-14 -No swelling, skin breakdown or fractures appreciated on images. -Continue to closely monitor patient's and use seated if required for safety.  Disposition/Need for in-Hospital Stay- patient unable to be discharged at this time due to -behavioral problems requiring medication adjustments, --At this time patient does not have a safe discharge plan, patient's mother unable to manage him at home, will need SNF facility -I have discussed with mother at bedside discharge options and she has expressed  that given her age and physical limitations no longer able to care for him at home. Social work has been made aware and will continue to follow on their assistance with placement.  Status is: Inpatient  Remains inpatient appropriate because:behavioral problems requiring medication adjustments   Status is: Inpatient  Remains inpatient appropriate because: Unsafe d/c plan   Dispo: The patient is from:Home Anticipated d/c is to:SNF versus group home. Anticipated d/c date is:To be determined. Patient currently is overall medically stable to d/c. Unfortunately no longer being able to be care by his mother at home due to physical limitations. From a safety standpoint will require to be placed for further care and assistance. Physical therapy has seen patient and recommended SNF for further care and rehab. TOC on board and helping with discharge plans.    Family Communication:Mother updated at bedside on 01/14/2020.  Consultants:none  Code Status: FULL   DVT Prophylaxis:  Lovenox   Procedures: As Listed in Progress Note Above  Antibiotics: None   Subjective: In no acute distress during my examination; no chest pain, no nausea, no vomiting.  Continue to be significantly weak and deconditioned.  Physical therapy has continued working with patient and recommends a skilled nursing facility for rehab and further care.  Patient is afebrile.  Objective: Vitals:   01/17/20 0500 01/17/20 2318 01/18/20 0203 01/18/20 0633  BP: 115/74 118/68  122/84  Pulse: 79 71  73  Resp: 20 20  18   Temp: 97.9 F (36.6 C)     TempSrc: Axillary     SpO2: 100%     Weight: 85.7 kg  85.7 kg   Height:        Intake/Output Summary (Last 24 hours) at 01/19/2020 1542 Last data filed at 01/19/2020 0100 Gross per 24 hour  Intake --  Output 3250 ml  Net -3250 ml  Weight change:   Exam: General exam: Alert, awake, oriented x 1;  following commands and currently calmed.  Per nursing staff continue to experience intermittent episode of agitation.  No fever, no chest pain, no nausea, no vomiting. Respiratory system: Clear to auscultation. Respiratory effort normal. Cardiovascular system:RRR. No murmurs, rubs, gallops. Gastrointestinal system: Abdomen is nondistended, soft and nontender.  G-tube has been removed. Normal bowel sounds heard. Central nervous system: Alert and oriented. No focal neurological deficits. Extremities: No cyanosis or clubbing. Skin: No petechiae. Psychiatry: No agitation or acute complaints currently.  Oriented x1.  Flat affect appreciated.   Data Reviewed: I have personally reviewed following labs and imaging studies  Urine analysis:    Component Value Date/Time   COLORURINE YELLOW 01/01/2020 2133   APPEARANCEUR HAZY (A) 01/01/2020 2133   LABSPEC 1.003 (L) 01/01/2020 2133   PHURINE 6.0 01/01/2020 2133   GLUCOSEU NEGATIVE 01/01/2020 2133   HGBUR LARGE (A) 01/01/2020 2133   BILIRUBINUR NEGATIVE 01/01/2020 2133   KETONESUR 20 (A) 01/01/2020 2133   PROTEINUR 30 (A) 01/01/2020 2133   NITRITE NEGATIVE 01/01/2020 2133   LEUKOCYTESUR LARGE (A) 01/01/2020 2133   Scheduled Meds: . ARIPiprazole  5 mg Oral BID  . diazepam  5 mg Oral Q12H  . enoxaparin (LOVENOX) injection  40 mg Subcutaneous Q24H  . feeding supplement  237 mL Oral BID BM  . ferrous sulfate  325 mg Oral BID WC  . folic acid  1 mg Oral Daily  . lamoTRIgine  100 mg Oral Daily  . metoprolol tartrate  12.5 mg Oral BID  . polyethylene glycol  17 g Oral Daily  . QUEtiapine  150 mg Per Tube BID  . senna  2 tablet Oral Daily   Continuous Infusions: . dextrose 5 % and 0.9% NaCl 100 mL/hr at 01/02/20 1017    Procedures/Studies: DG Elbow 2 Views Left  Result Date: 01/14/2020 CLINICAL DATA:  Pain status post fall EXAM: LEFT ELBOW - 2 VIEW COMPARISON:  None. FINDINGS: There is no evidence of fracture, dislocation, or joint  effusion. There is no evidence of arthropathy or other focal bone abnormality. Soft tissues are unremarkable. IMPRESSION: Negative. Electronically Signed   By: Katherine Mantle M.D.   On: 01/14/2020 19:31   CT Head Wo Contrast  Result Date: 01/01/2020 CLINICAL DATA:  Altered mental status. EXAM: CT HEAD WITHOUT CONTRAST TECHNIQUE: Contiguous axial images were obtained from the base of the skull through the vertex without intravenous contrast. COMPARISON:  December 28, 2019 FINDINGS: Brain: There is mild cerebral atrophy with widening of the extra-axial spaces and ventricular dilatation. There are areas of decreased attenuation within the white matter tracts of the supratentorial brain, consistent with microvascular disease changes. Vascular: No hyperdense vessel or unexpected calcification. Skull: Normal. Negative for fracture or focal lesion. Sinuses/Orbits: A 1.2 cm x 1.1 cm left maxillary sinus polyp versus mucous retention cyst is seen. Other: None. IMPRESSION: 1. Generalized cerebral atrophy. 2. No acute intracranial abnormality. 3. Left maxillary sinus polyp versus mucous retention cyst. Electronically Signed   By: Aram Candela M.D.   On: 01/01/2020 21:33   CT Angio Chest PE W/Cm &/Or Wo Cm  Result Date: 01/01/2020 CLINICAL DATA:  Abnormal chest x-ray and tachycardia EXAM: CT ANGIOGRAPHY CHEST WITH CONTRAST TECHNIQUE: Multidetector CT imaging of the chest was performed using the standard protocol during bolus administration of intravenous contrast. Multiplanar CT image reconstructions and MIPs were obtained to evaluate the vascular anatomy. CONTRAST:  OMNIPAQUE IOHEXOL 350 MG/ML SOLN  COMPARISON:  Radiograph same day FINDINGS: Cardiovascular: There is a optimal opacification of the pulmonary arteries. There is no central,segmental, or subsegmental filling defects within the pulmonary arteries. The heart is normal in size. No pericardial effusion or thickening. No evidence right heart  strain. There is normal three-vessel brachiocephalic anatomy without proximal stenosis. The thoracic aorta is normal in appearance. Mediastinum/Nodes: No hilar, mediastinal, or axillary adenopathy. Thyroid gland, trachea, and esophagus demonstrate no significant findings. Lungs/Pleura: The lungs are clear. No pleural effusion or pneumothorax. No airspace consolidation. Upper Abdomen: No acute abnormalities present in the visualized portions of the upper abdomen. Percutaneous gastrotomy tube seen within the mid body of the stomach. Musculoskeletal: No chest wall abnormality. No acute or significant osseous findings. Review of the MIP images confirms the above findings. IMPRESSION: No central, segmental, or subsegmental pulmonary embolism. No other acute intrathoracic pathology to explain the patient's symptoms. Electronically Signed   By: Jonna Clark M.D.   On: 01/01/2020 21:33   DG Chest Portable 1 View  Result Date: 01/01/2020 CLINICAL DATA:  Increased agitation for 4 days, history of brain injury EXAM: PORTABLE CHEST 1 VIEW COMPARISON:  Portable exam 1550 hours compared to 12/28/2019 and 09/28/2019 FINDINGS: Normal heart size, mediastinal contours, and pulmonary vascularity. New somewhat focal but ill-defined opacity in the RIGHT mid lung question pneumonia versus superimposed artifact. Remaining lungs clear. No pleural effusion or pneumothorax. Osseous structures unremarkable. IMPRESSION: New somewhat focal but ill-defined opacity in RIGHT mid lung question pneumonia; this was not seen on the prior study of 12/28/2019 nor on an earlier exam from 09/28/2019. Radiographic follow-up until resolution recommended to exclude pulmonary nodule. Electronically Signed   By: Ulyses Southward M.D.   On: 01/01/2020 16:05    Vassie Loll, MD  [facility]  If 7PM-7AM, please contact night-coverage [email or web address] Password [email or web address] 01/19/2020, 3:42 PM   LOS: 18 days

## 2020-01-19 NOTE — Consult Note (Signed)
[facility] Consult  Reason for Consult: Gastrostomy tube in place  Referring Physician: Dr. Gwenlyn Perking, MD   Chief Complaint    Altered Mental Status      HPI: Isaac [name] is a 39 y.o. male with bacterial meningitis 3 months go, NSTEMI due to demand ischemia who as admitted to the hospital with AMS with a UTI. He has been under the care of his mother who is a CNA and has been improving at home and in the hospital per report. He had a gastrostomy tube placed in 10/2019 per the mother, and this was placed at [facility]. She reports that he no longer uses this as he is tolerating a diet and taking his medications.  Dr. Gwenlyn Perking does state that the patient does sometime refuse his medications, but per the mother and patient he is doing well and they want the gastrostomy tube out.   I attempted to review the documentation for placement in Care Everywhere but could not find an operative or procedure report.  The CT chest demonstrates a balloon consistent with a gastrostomy tube and not PEG tube.   Past Medical History:  Diagnosis Date  . History of bacterial meningitis 01/01/2020  . NSTEMI (non-ST elevated myocardial infarction) (HCC) 09/28/2019    History reviewed. No pertinent surgical history.  History reviewed. No pertinent family history.  Social History   Tobacco Use  . Smoking status: Never Smoker  . Smokeless tobacco: Never Used  Substance Use Topics  . Alcohol use: Not Currently  . Drug use: Not Currently    Medications:  I have reviewed the patient's current medications. Prior to Admission:  Medications Prior to Admission  Medication Sig Dispense Refill Last Dose  . lamoTRIgine (LAMICTAL) 25 MG tablet Take 1 tablet daily for 14 days, then 1 tablet twice daily for 14 days, then 2 tablets twice daily.  Contact office for refills. (Patient taking differently: Take 25-100 mg by mouth See admin instructions. Take 1 tablet daily for 14 days, then 1 tablet  twice daily for 14 days, then 2 tablets twice daily.) 70 tablet 0 01/01/2020 at 0900  . QUEtiapine (SEROQUEL) 25 MG tablet 25mg  twice daily per gastric tube. (Patient taking differently: Take 25 mg by mouth 2 (two) times daily. Per gastric tube.) 60 tablet 5 01/01/2020 at 0900  . levETIRAcetam (KEPPRA) 100 MG/ML solution SMARTSIG:7.5 Milliliter(s) Gastro Tube Twice Daily (Patient not taking: Reported on 01/02/2020) 473 mL 1 Not Taking at Unknown time   Scheduled: . ARIPiprazole  5 mg Oral BID  . diazepam  5 mg Oral Q12H  . enoxaparin (LOVENOX) injection  40 mg Subcutaneous Q24H  . feeding supplement  237 mL Oral BID BM  . ferrous sulfate  325 mg Oral BID WC  . folic acid  1 mg Oral Daily  . lamoTRIgine  100 mg Oral Daily  . metoprolol tartrate  12.5 mg Oral BID  . polyethylene glycol  17 g Oral Daily  . QUEtiapine  150 mg Per Tube BID  . senna  2 tablet Oral Daily   Continuous: . dextrose 5 % and 0.9% NaCl 100 mL/hr at 01/02/20 01/04/20   9390 **OR** acetaminophen, haloperidol lactate, LORazepam, ondansetron **OR** ondansetron (ZOFRAN) IV, polyvinyl alcohol  No Known Allergies   ROS:  A comprehensive review of systems was negative except for: Gastrointestinal: positive for gastrostomy tube in place, pain around tube  Blood pressure 122/84, pulse 73, temperature 97.9 F (36.6 C), temperature source Axillary, resp. rate 18,  height 6\' 2"  (1.88 m), weight 85.7 kg, SpO2 100 %. Physical Exam Vitals reviewed.  Constitutional:      Appearance: Normal appearance.  HENT:     Nose: Nose normal.  Eyes:     Pupils: Pupils are equal, round, and reactive to light.  Cardiovascular:     Rate and Rhythm: Normal rate.  Pulmonary:     Effort: Pulmonary effort is normal.  Abdominal:     General: There is no distension.     Palpations: Abdomen is soft.     Comments: Gastrostomy tube in place in left upper quadrant, minimally tender around   Musculoskeletal:        General: Swelling  present.     Cervical back: Normal range of motion.  Skin:    General: Skin is warm.  Neurological:     Mental Status: He is alert. Mental status is at baseline.     Results: CT chest 10/28 with gastrostomy tube and balloon in stomach noted   Assessment & Plan:  Isaac [name] is a 39 y.o. male with a gastrostomy tube in place since 10/2019 per his mother. He is eating and taking medications by mouth and they want the feeding tube removed. I did ask about the occasional refusal of medications, and the mother reports he is doing well and they want the tube removed.  Discussed risk of bleeding, gastrostomy site not closing, him needing a replacement tube in the future.   -Saline removed from balloon and gastrostomy tube remove without issues. Dry bandage placed. Leave bandage in place for next 72 hours, change if needed.   -No follow up with me needed  All questions were answered to the satisfaction of the patient and family.    11/2019 01/19/2020, 2:41 PM

## 2020-01-20 DIAGNOSIS — N3001 Acute cystitis with hematuria: Secondary | ICD-10-CM | POA: Diagnosis not present

## 2020-01-20 DIAGNOSIS — I69319 Unspecified symptoms and signs involving cognitive functions following cerebral infarction: Secondary | ICD-10-CM | POA: Diagnosis not present

## 2020-01-20 DIAGNOSIS — R451 Restlessness and agitation: Secondary | ICD-10-CM | POA: Diagnosis not present

## 2020-01-20 DIAGNOSIS — R4689 Other symptoms and signs involving appearance and behavior: Secondary | ICD-10-CM | POA: Diagnosis not present

## 2020-01-20 NOTE — Progress Notes (Signed)
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 He had elevated troponins over 5000.Due to combativeness, he required sedation and subsequent intubation [facility] and diagnosed with pneumococcal meningitis. He was started on ceftriaxone. Due to possible seizure, he was started on Keppra and placed on continuous EEG which was negative for seizure activity. 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 He received a 5 day course of SoluMedrol for cerebral edema. He then developed exteensor posturing with tachycardia and hypertension concerning for elevated intracranial pressure. He underwent Bolt ICP monitoring which demonstrated no elevated ICP. Follow up CT head and subsequent repeat MRI of brain showed small subacute hemorrhage in the right suboccipital lobe, either hemorrhagic conversion of small ischemic stroke vs extension of previously seen microhemorrhage, as well as hypoxic injury involving the bilateral caudate and putamen. He also sustained a NSTEMI. He was extubated on 10/10/2019 and underwent slow taper of Dilaudid to minimize withdrawal symptoms. A G-tube was inserted on 10/16/2019 and he was discharged on 10/18/2019 under the care of his mother who is a CNA.   He has been going to outpatient PT/OT and has been getting stronger. He is still taking food and medication via G-tube. He has not been able to establish care with a  PCP yet. He is easily angry and has kicked his mother. He does not talk much and often when he speaks, it may be a curse word. He is still incontinent. He requiredassistance with essential ADLs such as bathing, dressing and using toilet.  Assessment/Plan: Acuteon chronicmetabolic encephalopathy--- worsening behavioral problems superimposed on underlying anoxic brain injury -AddedAbilify 5 mg twice daily -Continue Lamictal 100 mg daily -Keppra was recently discontinued as patient's EEG was nonepileptiformand may have been contributing to increased agitation --Lorazepam or Haldol as needed agitation -continue to gradually titrate up seroquel -I have discussed the risks/benefits of seroquel including its "black box warning" with patient's mother  -she expresses understanding in light of the patient's behavioral issues -She expresses that other medications and change of venue/scenery have been tried to try to provide care for the patient -in light of her inability to care for the patient and failure of other modalities, she accepts the risks with seroquel in hopes that his behavior can be controlled to allow for care of the patient -I suspect that his overall mental status with periods of agitation ishis usual baseline at home -continuevalium bid -11/5 increased seroquel to 150 mg bid -11/6 overall less aggression but continues to have episodes of agitation -01/11/20--restraints off for ~24 hours>>72 -overall now more re-directable and following commands; no further aggressive behavior after adjustment of medications  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 -will continue to follow response.  SVT/HTN -patient had intermittent episodes of SVT -controlled and resolved with the use of metoprolol -will observed on telemetry for another 24 hours for stability assessment and if stable will D/C tele.  Social/Ethics -patient's mother was a trained CNA is no longer able to manage him at home due to escalating behaviors --Patient remains total care- He is still taking food and medication via G-tube. -He is easily angry, gets agitated and may kick and punch. He does not talk much. He is still incontinent. He requires assistance with essential ADLs such as bathing, dressing and using toilet -Familydoes not feel that they can safely manage the patient at home and arerequesting placement to facility. -Social work consultation to help with placement requested  chronic anemia/iron deficiency anemia -multifactorial , partly nutritional, expect further drop in H&H with hydration/hemodilution -iron sat--9% -folate 7.1 -B12--2310 -startedon po iron and folate  presumed UTI --patient didn't meet sepsis criteria -has been treatedempirically with IV Rocephinx 3 days -No dysuria, no frank hematuria or complaints of suprapubic abdominal pain.  Left elbow pain -Patient with ongoing weakness fall out of bed on 2020-01-14 -No swelling, skin breakdown or fractures appreciated on images. -Continue to closely monitor patient's and use seated if required for safety.  Disposition/Need for in-Hospital Stay- patient unable to be discharged at this time due to -behavioral problems requiring medication adjustments, --At this time patient does not have a safe discharge plan, patient's mother unable to manage him at home, will need SNF facility -I have discussed with mother at bedside discharge options and she has expressed  that given her age and physical limitations no longer able to care for him at home. Social work has been made aware and will continue to follow on their assistance with placement.  Status is: Inpatient  Remains inpatient appropriate because:behavioral problems requiring medication adjustments   Status is: Inpatient  Remains inpatient appropriate because: Unsafe d/c plan   Dispo: The patient is from:Home Anticipated d/c is to:SNF versus group home. Anticipated d/c date is:To be determined. Patient currently is overall medically stable to d/c. Unfortunately no longer being able to be care by his mother at home due to physical limitations. From a safety standpoint will require to be placed for further care and assistance. Physical therapy has seen patient and recommended SNF for further care and rehab. TOC on board and helping with discharge plans.    Family Communication:Fiancee updated at bedside on 01/18/2020. No family at bedside today.  Consultants:none  Code Status: FULL   DVT Prophylaxis: [place] Lovenox   Procedures: As Listed in Progress Note Above  Antibiotics: None   Subjective: No fever, no chest pain, no nausea, no vomiting.  Continue to be weak and deconditioned.  No shortness of breath.  No overnight events.  Objective: Vitals:   01/18/20 0203 01/18/20 0633 01/19/20 2241 01/20/20 0500  BP:  122/84 116/65   Pulse:  73 92   Resp:  18 18   Temp:   99.4 F (37.4 C)   TempSrc:   Axillary   SpO2:   100%   Weight: 85.7 kg   70.2 kg  Height:        Intake/Output Summary (Last 24 hours) at 01/20/2020 0834 Last data filed at 01/19/2020 1800 Gross per 24 hour  Intake 480 ml  Output --  Net 480 ml   Weight change:   Exam: General exam: Alert, awake, oriented x 1; no fever,  no chest pain, no nausea, no vomiting.  No overnight events. Respiratory system: Clear to auscultation. Respiratory  effort normal.  Good oxygen saturation on room air. Cardiovascular system:RRR. No murmurs, rubs, gallops.  No JVD. Gastrointestinal system: Abdomen is nondistended, soft and nontender. No organomegaly or masses felt. Normal bowel sounds heard. Central nervous system: No new focal neurological deficits. Extremities: No cyanosis or clubbing. Skin: No petechiae. Psychiatry: Patient continued to experience intermittent episodes of agitation and restlessness; no hallucinations, calmed and following commands currently.   Data Reviewed: I have personally reviewed following labs and imaging studies  Urine analysis:    Component Value Date/Time   COLORURINE YELLOW 01/01/2020 2133   APPEARANCEUR HAZY (A) 01/01/2020 2133   LABSPEC 1.003 (L) 01/01/2020 2133   PHURINE 6.0 01/01/2020 2133   GLUCOSEU NEGATIVE 01/01/2020 2133   HGBUR LARGE (A) 01/01/2020 2133   BILIRUBINUR NEGATIVE 01/01/2020 2133   KETONESUR 20 (A) 01/01/2020 2133   PROTEINUR 30 (A) 01/01/2020 2133   NITRITE NEGATIVE 01/01/2020 2133   LEUKOCYTESUR LARGE (A) 01/01/2020 2133   Scheduled Meds: . ARIPiprazole  5 mg Oral BID  . diazepam  5 mg Oral Q12H  . enoxaparin (LOVENOX) injection  40 mg Subcutaneous Q24H  . feeding supplement  237 mL Oral BID BM  . ferrous sulfate  325 mg Oral BID WC  . folic acid  1 mg Oral Daily  . lamoTRIgine  100 mg Oral Daily  . metoprolol tartrate  12.5 mg Oral BID  . polyethylene glycol  17 g Oral Daily  . QUEtiapine  150 mg Per Tube BID  . senna  2 tablet Oral Daily   Continuous Infusions: . dextrose 5 % and 0.9% NaCl 100 mL/hr at 01/02/20 [id number]    Procedures/Studies: DG Elbow 2 Views Left  Result Date: 01/14/2020 CLINICAL DATA:  Pain status post fall EXAM: LEFT ELBOW - 2 VIEW COMPARISON:  None. FINDINGS: There is no evidence of fracture, dislocation, or joint effusion. There is no evidence of arthropathy or other focal bone abnormality. Soft tissues are unremarkable. IMPRESSION: Negative.  Electronically Signed   By: Katherine Mantlehristopher  Green M.D.   On: 01/14/2020 19:31   CT Head Wo Contrast  Result Date: 01/01/2020 CLINICAL DATA:  Altered mental status. EXAM: CT HEAD WITHOUT CONTRAST TECHNIQUE: Contiguous axial images were obtained from the base of the skull through the vertex without intravenous contrast. COMPARISON:  December 28, 2019 FINDINGS: Brain: There is mild cerebral atrophy with widening of the extra-axial spaces and ventricular dilatation. There are areas of decreased attenuation within the white matter tracts of the supratentorial brain, consistent with microvascular disease changes. Vascular: No hyperdense vessel or unexpected calcification. Skull: Normal. Negative for fracture or focal lesion. Sinuses/Orbits: A 1.2 cm x 1.1 cm left maxillary sinus polyp versus mucous retention cyst is seen. Other: None. IMPRESSION: 1. Generalized cerebral atrophy. 2. No acute intracranial abnormality. 3. Left maxillary sinus polyp versus mucous retention cyst. Electronically Signed   By: Aram Candelahaddeus  Houston M.D.   On: 01/01/2020 21:33   CT Angio Chest PE W/Cm &/Or Wo Cm  Result Date: 01/01/2020 CLINICAL DATA:  Abnormal chest x-ray and tachycardia EXAM: CT ANGIOGRAPHY CHEST WITH CONTRAST TECHNIQUE: Multidetector CT imaging of the chest was performed using the standard protocol during bolus administration of intravenous contrast. Multiplanar CT image reconstructions and MIPs were obtained to evaluate the vascular anatomy. CONTRAST:  100mL OMNIPAQUE IOHEXOL 350 MG/ML SOLN COMPARISON:  Radiograph same day FINDINGS: Cardiovascular: There is a optimal opacification of the pulmonary  arteries. There is no central,segmental, or subsegmental filling defects within the pulmonary arteries. The heart is normal in size. No pericardial effusion or thickening. No evidence right heart strain. There is normal three-vessel brachiocephalic anatomy without proximal stenosis. The thoracic aorta is normal in appearance.  Mediastinum/Nodes: No hilar, mediastinal, or axillary adenopathy. Thyroid gland, trachea, and esophagus demonstrate no significant findings. Lungs/Pleura: The lungs are clear. No pleural effusion or pneumothorax. No airspace consolidation. Upper Abdomen: No acute abnormalities present in the visualized portions of the upper abdomen. Percutaneous gastrotomy tube seen within the mid body of the stomach. Musculoskeletal: No chest wall abnormality. No acute or significant osseous findings. Review of the MIP images confirms the above findings. IMPRESSION: No central, segmental, or subsegmental pulmonary embolism. No other acute intrathoracic pathology to explain the patient's symptoms. Electronically Signed   By: Jonna Clark M.D.   On: 01/01/2020 21:33   DG Chest Portable 1 View  Result Date: 01/01/2020 CLINICAL DATA:  Increased agitation for 4 days, history of brain injury EXAM: PORTABLE CHEST 1 VIEW COMPARISON:  Portable exam 1550 hours compared to 12/28/2019 and 09/28/2019 FINDINGS: Normal heart size, mediastinal contours, and pulmonary vascularity. New somewhat focal but ill-defined opacity in the RIGHT mid lung question pneumonia versus superimposed artifact. Remaining lungs clear. No pleural effusion or pneumothorax. Osseous structures unremarkable. IMPRESSION: New somewhat focal but ill-defined opacity in RIGHT mid lung question pneumonia; this was not seen on the prior study of 12/28/2019 nor on an earlier exam from 09/28/2019. Radiographic follow-up until resolution recommended to exclude pulmonary nodule. Electronically Signed   By: Ulyses Southward M.D.   On: 01/01/2020 16:05    Vassie Loll, MD  [facility]  If 7PM-7AM, please contact night-coverage [email or web address] Password [email or web address] 01/20/2020, 8:34 AM   LOS: 19 days

## 2020-01-20 NOTE — TOC Progression Note (Signed)
Transition of Care [facility]) - Progression Note    Patient Details  Name: Isaac [name] MRN: [id number] Date of Birth: 06/18/1980  [facility] CM/SW Contact  Karn Cassis, [place] Phone Number: 01/20/2020, 12:04 PM  Clinical Narrative: It was suggested that pt may be appropriate for [facility]. LCSW spoke with Loraine Leriche in admissions who reports that they are not currently accepting admissions and at this point it would be 6-12 months out. LCSW updated supervisor. TOC will continue to follow and assist with d/c planning.         Barriers to Discharge: No SNF bed  Expected Discharge Plan and Services                                                 Social Determinants of Health (SDOH) Interventions    Readmission Risk Interventions No flowsheet data found.

## 2020-01-21 ENCOUNTER — Encounter: Payer: Medicaid Other | Admitting: Physical Medicine and Rehabilitation

## 2020-01-21 MED ORDER — SENNOSIDES-DOCUSATE SODIUM 8.6-50 MG PO TABS
2.0000 | ORAL_TABLET | Freq: Two times a day (BID) | ORAL | Status: DC
  Administered 2020-01-21 – 2020-02-23 (×55): 2 via ORAL
  Filled 2020-01-21 (×58): qty 2

## 2020-01-21 NOTE — Progress Notes (Signed)
Nutrition Follow-up  DOCUMENTATION CODES:   Not applicable  INTERVENTION:  Continue Ensure Enlive po BID, each supplement provides 350 kcal and 20 grams of protein  Continue Magic cup TID with meals, each supplement provides 290 kcal and 9 grams of protein  Encouraged po intake of meals/supplements  Recommend obtaining standing wt as able  NUTRITION DIAGNOSIS:   Inadequate oral intake related to inability to eat as evidenced by  (PEG tube dependent for nutrition/hydration and medications s/p multiple subacute strokes with residual weakness secondary to recent bacterial meningitis). -resolved  GOAL:   Patient will meet greater than or equal to 90% of their needs -meeting  MONITOR:   PO intake, Weight trends, Labs, Diet advancement, I & O's  REASON FOR ASSESSMENT:   Rounds    ASSESSMENT:   39 year old male with history of recent hospitalization for bacterial meningitis (7/24-8/14). 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 Pt presented with progressively worsening mental status, increased agitation and family reports unable to safely manage him at home.  10/28-admit  Patient sitting in recliner this morning, mother present in room. He is pleasant and calm, says he is doing alright today and smiles. He reports some abdominal soreness s/p removal of G-tube on 11/15. Patient endorses good appetite, eating 100% of meals and consuming Ensure supplements. RD encouraged pt to continue po intake as well as inquired about food preferences to ensure optimal nutrition. None offered today.   Admit wt 81.6 kg (179.52 lbs)      Current wt 70.4 kg (154.88 lbs) Question if current wt entered in error, recommend obtaining standing weight if able.   Medications reviewed and include: Abilify, Valium, Ferrous sulfate, Folic acid, Lamictal, Miralax,  Senokot Seroquel  No new labs for review  Diet Order:   Diet Order            Diet regular Room service appropriate? Yes; Fluid consistency: Thin  Diet effective now                 EDUCATION NEEDS:   Education needs have been addressed  Skin:  Skin Assessment: Reviewed RN Assessment  Last BM:  11/17-type 6  Height:   Ht Readings from Last 1 Encounters:  01/02/20 6\' 2"  (1.88 m)    Weight:   Wt Readings from Last 1 Encounters:  01/21/20 70.4 kg    BMI:  Body mass index is 19.93 kg/m.  Estimated Nutritional Needs:   Kcal:  2080-2330  Protein:  105-115  Fluid:  >/= 2 L/day   06-10-1970, RD, LDN Clinical Nutrition After Hours/Weekend Pager # in Amion

## 2020-01-21 NOTE — Progress Notes (Signed)
Pt continues to sit up in recliner. Still will not allow staff to clean him of urine incontinence. Tense, restless but no untoward behavior noted. Pt has drank gingerale and is watching TV. Mother states pt is agitated and asks about medication for same. Pt calm, no behavior noted to validate need for Haldol at this time. Pt states is hungry, lunch trays being delivered. Will attempt to clean pt once he has eaten lunch. Mother advised.

## 2020-01-21 NOTE — Progress Notes (Signed)
Pt voided incontinently in bed. Pt assisted up to chair to clean bed, change linens and clean pt. Pt refused to allow staff to clean him, then voided incontinently in chair and floor. Pt continues to refuse to allow staff to clean him or change his gown. Occasional cursing, jerking hands away from staff. Pt did allow me clean floor under his feet and to take his B/P and heartrate, HR 112/min. Pt given his regular am meds. Feeding self ice cream and drinking Ensure at this time, calm.  Pt's mother at bedside, and advised that we will attempt to clean pt once he is more cooperative, as repeated attempts to clean pt just seem to increase his anxiety and agitation.She states understanding.

## 2020-01-21 NOTE — Progress Notes (Signed)
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 He had elevated troponins over 5000.Due to combativeness, he required sedation and subsequent intubation [facility] with [facility]. He was started on ceftriaxone. Due to possible seizure, he was started on Keppra and placed on continuous EEG which was negative for seizure activity. 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 He received a 5 day course of SoluMedrol for cerebral edema. He then developed exteensor posturing with tachycardia and hypertension concerning for elevated intracranial pressure. He underwent Bolt ICP monitoring which demonstrated no elevated ICP. Follow up CT head and subsequent repeat MRI of brain showed small subacute hemorrhage in the right suboccipital lobe, either hemorrhagic conversion of small ischemic stroke vs extension of previously seen microhemorrhage, as well as hypoxic injury involving the bilateral caudate and putamen. He also sustained a NSTEMI. He was extubated on 10/10/2019 and underwent slow taper of Dilaudid to minimize withdrawal symptoms. A G-tube was inserted on 10/16/2019 and he was discharged on 10/18/2019 under the care of his mother who is a CNA.   He has been going to outpatient PT/OT and has been getting stronger. He is still taking food and medication via G-tube. He has not been able to establish care with a  PCP yet. He is easily angry and has kicked his mother. He does not talk much and often when he speaks, it may be a curse word. He is still incontinent. He requiredassistance with essential ADLs such as bathing, dressing and using toilet.  Assessment/Plan: Acuteon chronicmetabolic encephalopathy--- worsening behavioral problems superimposed on underlying anoxic brain injury -c/n Abilify 5 mg twice daily -Continue Lamictal 100 mg daily -Keppra was recently discontinued as patient's EEG was nonepileptiformand may have been contributing to increased agitation --Lorazepam or Haldol as needed agitation -Previously  discussed the risks/benefits of seroquel including its "black box warning" with patient's mother  -she expresses understanding in light of the patient's behavioral issues -She expresses that other medications and change of venue/scenery have been tried to try to provide care for the patient -in light of her inability to care for the patient and failure of other modalities, she accepts the risks with seroquel in hopes that his behavior can be controlled to allow for care of the patient -- -continuevalium bid -11/5 increased seroquel to 150 mg bid -11/6 overall less aggression but continues to have episodes of agitation -01/11/20--restraints off  -overall now more re-directable and following commands; no further aggressive behavior after adjustment of medications  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 -will continue to  follow response.  SVT/HTN -patient had intermittent episodes of SVT -controlled and resolved  with the use of metoprolol  Social/Ethics -patient's mother was a trained CNA is no longer able to manage him at home due to escalating behaviors --Patient remains total care- He is still taking food and medication via G-tube. -He is easily angry, gets agitated and may kick and punch. He does not talk much. He is still incontinent. He requires assistance with essential ADLs such as bathing, dressing and using toilet -Familydoes not feel that they can safely manage the patient at home and arerequesting placement to facility. -Social work consultation to help with placement requested  chronic anemia/iron deficiency anemia -multifactorial , partly nutritional, expect further drop in H&H with hydration/hemodilution -iron sat--9% -folate 7.1 -B12--2310 -startedon po iron and folate  Left elbow pain -Patient with ongoing weakness fall out of bed on 2020-01-14 -No swelling, skin breakdown or fractures appreciated on images. -Continue to closely monitor patient's and use seated if required for safety.  Disposition/Need for in-Hospital Stay- patient unable to be discharged at this time due to -behavioral problems requiring medication adjustments, --At this time patient does not have a safe discharge plan, patient's mother unable to manage him at home, will need SNF facility -I have discussed with mother at bedside discharge options and she has expressed that given her age and physical limitations no longer able to care for him at home. Social work has been made aware and will continue to follow on their assistance with placement.  Status is: Inpatient  Remains inpatient appropriate because:behavioral problems requiring medication adjustments   Status is: Inpatient  Remains inpatient appropriate because: Unsafe d/c plan   Dispo: The patient is  from:Home Anticipated d/c is to:SNF versus group home. Anticipated d/c date is:To be determined. Patient currently is overall medically stable to d/c. Unfortunately no longer being able to be care by his mother at home due to physical limitations. From a safety standpoint will require to be placed for further care and assistance. Physical therapy has seen patient and recommended SNF for further care and rehab. TOC on board and helping with discharge plans.    Family Communication:Fiancee updated at bedside on 01/18/2020. No family at bedside today.  Consultants:none  Code Status: FULL   DVT Prophylaxis: [place] Lovenox   Procedures: As Listed in Progress Note Above  Antibiotics: None   Subjective: - Had BM, oral intake is good -Patient is redirectable  Objective: Vitals:   01/20/20 2221 01/21/20 0115 01/21/20 1055 01/21/20 1500  BP:   138/82 118/76  Pulse:   (!) 112 (!) 101  Resp:      Temp:    98.6 F (37 C)  TempSrc:    Oral  SpO2: 100%   100%  Weight:  70.4 kg    Height:        Intake/Output Summary (Last 24 hours) at 01/21/2020 1723 Last data filed at 01/21/2020 0900 Gross per 24 hour  Intake 840 ml  Output 500 ml  Net 340 ml   Weight change: 0.2 kg  Exam: General exam: Alert, awake, oriented x 1;   Respiratory system: Clear to auscultation. Respiratory effort normal.  Good oxygen saturation on room air. Cardiovascular system:RRR. No murmurs, rubs, gallops.  No JVD. Gastrointestinal system: Abdomen is nondistended, soft and nontender. No organomegaly or masses felt. Normal bowel sounds heard. Central nervous system: No new focal neurological deficits. Extremities: No cyanosis or clubbing. Skin: No petechiae. Psychiatry: Patient continued to experience intermittent episodes of agitation and restlessness; for the most part able to follow commands currently.  Data Reviewed: I have personally  reviewed following labs and imaging studies  Urine analysis:    Component Value Date/Time   COLORURINE YELLOW 01/01/2020 2133   APPEARANCEUR HAZY (A) 01/01/2020 2133   LABSPEC 1.003 (L) 01/01/2020 2133   PHURINE 6.0 01/01/2020 2133   GLUCOSEU NEGATIVE 01/01/2020 2133   HGBUR LARGE (A) 01/01/2020 2133   BILIRUBINUR NEGATIVE 01/01/2020 2133   KETONESUR 20 (A) 01/01/2020 2133   PROTEINUR 30 (A) 01/01/2020 2133   NITRITE NEGATIVE 01/01/2020 2133   LEUKOCYTESUR LARGE (A) 01/01/2020 2133   Scheduled Meds: . ARIPiprazole  5 mg Oral BID  . diazepam  5 mg Oral Q12H  . enoxaparin (LOVENOX) injection  40 mg Subcutaneous Q24H  . feeding supplement  237 mL Oral BID BM  . ferrous sulfate  325 mg Oral BID WC  . folic acid  1 mg Oral Daily  . lamoTRIgine  100 mg Oral Daily  . metoprolol tartrate  12.5 mg Oral BID  . polyethylene glycol  17 g Oral Daily  . QUEtiapine  150 mg Per Tube BID  . senna  2 tablet Oral Daily   Continuous Infusions: . dextrose 5 % and 0.9% NaCl 100 mL/hr at 01/02/20 9147    Procedures/Studies: DG Elbow 2 Views Left  Result Date: 01/14/2020 CLINICAL DATA:  Pain status post fall EXAM: LEFT ELBOW - 2 VIEW COMPARISON:  None. FINDINGS: There is no evidence of fracture, dislocation, or joint effusion. There is no evidence of arthropathy or other focal bone abnormality. Soft tissues are unremarkable. IMPRESSION: Negative. Electronically Signed   By: Katherine Mantle M.D.   On: 01/14/2020 19:31   CT Head Wo Contrast  Result Date: 01/01/2020 CLINICAL DATA:  Altered mental status. EXAM: CT HEAD WITHOUT CONTRAST TECHNIQUE: Contiguous axial images were obtained from the base of the skull through the vertex without intravenous contrast. COMPARISON:  December 28, 2019 FINDINGS: Brain: There is mild cerebral atrophy with widening of the extra-axial spaces and ventricular dilatation. There are areas of decreased attenuation within the white matter tracts of the supratentorial  brain, consistent with microvascular disease changes. Vascular: No hyperdense vessel or unexpected calcification. Skull: Normal. Negative for fracture or focal lesion. Sinuses/Orbits: A 1.2 cm x 1.1 cm left maxillary sinus polyp versus mucous retention cyst is seen. Other: None. IMPRESSION: 1. Generalized cerebral atrophy. 2. No acute intracranial abnormality. 3. Left maxillary sinus polyp versus mucous retention cyst. Electronically Signed   By: Aram Candela M.D.   On: 01/01/2020 21:33   CT Angio Chest PE W/Cm &/Or Wo Cm  Result Date: 01/01/2020 CLINICAL DATA:  Abnormal chest x-ray and tachycardia EXAM: CT ANGIOGRAPHY CHEST WITH CONTRAST TECHNIQUE: Multidetector CT imaging of the chest was performed using the standard protocol during bolus administration of intravenous contrast. Multiplanar CT image reconstructions and MIPs were obtained to evaluate the vascular anatomy. CONTRAST:  OMNIPAQUE IOHEXOL 350 MG/ML SOLN COMPARISON:  Radiograph same day FINDINGS: Cardiovascular: There is a optimal opacification of the pulmonary arteries. There is no central,segmental, or subsegmental filling defects within the pulmonary arteries. The heart is normal in size. No pericardial effusion or thickening. No evidence right heart strain. There is normal three-vessel brachiocephalic anatomy without proximal stenosis. The thoracic aorta is normal in appearance. Mediastinum/Nodes: No hilar, mediastinal, or axillary adenopathy. Thyroid gland, trachea, and esophagus demonstrate no significant findings. Lungs/Pleura: The lungs are clear. No pleural effusion or pneumothorax. No airspace consolidation. Upper Abdomen: No acute abnormalities present in the visualized portions of the upper abdomen.  Percutaneous gastrotomy tube seen within the mid body of the stomach. Musculoskeletal: No chest wall abnormality. No acute or significant osseous findings. Review of the MIP images confirms the above findings. IMPRESSION: No  central, segmental, or subsegmental pulmonary embolism. No other acute intrathoracic pathology to explain the patient's symptoms. Electronically Signed   By: Jonna ClarkBindu  Avutu M.D.   On: 01/01/2020 21:33   DG Chest Portable 1 View  Result Date: 01/01/2020 CLINICAL DATA:  Increased agitation for 4 days, history of brain injury EXAM: PORTABLE CHEST 1 VIEW COMPARISON:  Portable exam 1550 hours compared to 12/28/2019 and 09/28/2019 FINDINGS: Normal heart size, mediastinal contours, and pulmonary vascularity. New somewhat focal but ill-defined opacity in the RIGHT mid lung question pneumonia versus superimposed artifact. Remaining lungs clear. No pleural effusion or pneumothorax. Osseous structures unremarkable. IMPRESSION: New somewhat focal but ill-defined opacity in RIGHT mid lung question pneumonia; this was not seen on the prior study of 12/28/2019 nor on an earlier exam from 09/28/2019. Radiographic follow-up until resolution recommended to exclude pulmonary nodule. Electronically Signed   By: Ulyses SouthwardMark  Boles M.D.   On: 01/01/2020 16:05    Danean Marner Mariea ClontsEmokpae, MD  [facility]  If 7PM-7AM, please contact night-coverage [email or web address] Password [email or web address] 01/21/2020, 5:23 PM   LOS: 20 days

## 2020-01-21 NOTE — Progress Notes (Signed)
Pt remains up in recliner, denies c/o. Skin warm and dry, alert, oriented to self onlyl. Mother remains at bedside. Ate 100% of lunch tray. Refuses to allow staff to clean him of incontinent urine or place dry gown, states, "I'm good. Let me be." Pt gets agitated when staff attempts to move gown or blanket. Refuses to allow staff to check blood pressure as well.

## 2020-01-21 NOTE — Progress Notes (Incomplete)
Pt remains up in recliner

## 2020-01-22 NOTE — Progress Notes (Signed)
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 He had elevated troponins over 5000.Due to combativeness, he required sedation and subsequent intubation [facility] with [facility]. He was started on ceftriaxone. Due to possible seizure, he was started on Keppra and placed on continuous EEG which was negative for seizure activity. 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 He received a 5 day course of SoluMedrol for cerebral edema. He then developed exteensor posturing with tachycardia and hypertension concerning for elevated intracranial pressure. He underwent Bolt ICP monitoring which demonstrated no elevated ICP. Follow up CT head and subsequent repeat MRI of brain showed small subacute hemorrhage in the right suboccipital lobe, either hemorrhagic conversion of small ischemic stroke vs extension of previously seen microhemorrhage, as well as hypoxic injury involving the bilateral caudate and putamen. He also sustained a NSTEMI. He was extubated on 10/10/2019 and underwent slow taper of Dilaudid to minimize withdrawal symptoms. A G-tube was inserted on 10/16/2019 and he was discharged on 10/18/2019 under the care of his mother who is a CNA.   He has been going to outpatient PT/OT and has been getting stronger. He is still taking food and medication via G-tube. He has not been able to establish care with a  PCP yet. He is easily angry and has kicked his mother. He does not talk much and often when he speaks, it may be a curse word. He is still incontinent. He requiredassistance with essential ADLs such as bathing, dressing and using toilet.  Assessment/Plan: Acuteon chronicmetabolic encephalopathy--- worsening behavioral problems superimposed on underlying anoxic brain injury -c/n Abilify 5 mg twice daily -Continue Lamictal 100 mg daily -Keppra was recently discontinued as patient's EEG was nonepileptiformand may have been contributing to increased agitation --Lorazepam or Haldol as needed agitation -Previously  discussed the risks/benefits of seroquel including its "black box warning" with patient's mother  -she expresses understanding in light of the patient's behavioral issues -She expresses that other medications and change of venue/scenery have been tried to try to provide care for the patient -in light of her inability to care for the patient and failure of other modalities, she accepts the risks with seroquel in hopes that his behavior can be controlled to allow for care of the patient -- -continuevalium bid -11/5 increased seroquel to 150 mg bid -11/6 overall less aggression but continues to have episodes of agitation -01/11/20--restraints off  -overall now more re-directable and following commands; no further aggressive behavior after adjustment of medications 01/22/20 -Awaiting placement to appropriate facility as patient's mother who is a trained CNA is no longer able to manage him at home  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 -May use  PEG tube as  needed for medication administration -Ongoing speech therapy requested -will continue to follow response.  SVT/HTN -patient had intermittent episodes of SVT -controlled and resolved with the use of metoprolol  Social/Ethics -patient's mother was a trained CNA is no longer able to manage him at home due to escalating behaviors --Patient remains total care- He is still taking food and medication via G-tube. -He is easily angry, gets agitated and may kick and punch. He does not talk much. He is still incontinent. He requires assistance with essential ADLs such as bathing, dressing and using toilet -Familydoes not feel that they can safely manage the patient at home and arerequesting placement to facility. -Social work consultation to help with placement requested  chronic anemia/iron deficiency anemia -multifactorial , partly nutritional, expect further drop in H&H with hydration/hemodilution -iron sat--9% -folate 7.1 -B12--2310 -startedon po iron and folate  Left elbow pain -Patient with ongoing weakness fall out of bed on 2020-01-14 -No swelling, skin breakdown or fractures appreciated on images. -Continue to closely monitor patient's and use seated if required for safety.  Disposition/Need for in-Hospital Stay- patient unable to be discharged at this time due to -behavioral problems requiring medication adjustments, --At this time patient does not have a safe discharge plan, -Awaiting placement to appropriate facility as patient's mother who is a trained CNA is no longer able to manage him at home  Status is: Inpatient  Remains inpatient appropriate because:behavioral problems requiring medication adjustments   Status is: Inpatient  Remains inpatient appropriate because: Unsafe d/c plan   Dispo: The patient is from:Home Anticipated d/c is to:SNF versus group home. Anticipated d/c date is:To be  determined. Patient currently is overall medically stable to d/c.    Family Communication:Fiancee updated at bedside on 01/18/2020. No family at bedside today.  Consultants:none  Code Status: FULL   DVT Prophylaxis: [place] Lovenox   Procedures: As Listed in Progress Note Above  Antibiotics: None   Subjective: - --Mostly cooperative, however refuses needlesticks including blood draws -Eating well, having BMs  Objective: Vitals:   01/21/20 1500 01/21/20 2121 01/22/20 0236 01/22/20 0628  BP: 118/76 104/65  112/77  Pulse: (!) 101 87  84  Resp:  19  18  Temp: 98.6 F (37 C) 98.2 F (36.8 C)  98.2 F (36.8 C)  TempSrc: Oral     SpO2: 100% 100%    Weight:   70.5 kg   Height:       No intake or output data in the 24 hours ending 01/22/20 0950 Weight change: 0.1 kg  Exam: General exam: Alert, awake, oriented x 1;   Respiratory system: Clear to auscultation. Respiratory effort normal.  Good oxygen saturation on room air. Cardiovascular system:RRR. No murmurs, rubs, gallops.  No JVD. Gastrointestinal system: Abdomen is nondistended, soft and nontender.  . Normal bowel sounds heard. PEG tube in situ Central nervous system: No new focal neurological deficits. Extremities: No cyanosis or clubbing. Skin: No petechiae. Psychiatry: Patient continued to experience intermittent episodes of agitation and restlessness; for the most part able to follow commands currently.   Data Reviewed: I have personally reviewed following labs and imaging studies  Urine analysis:    Component Value Date/Time   COLORURINE YELLOW 01/01/2020 2133   APPEARANCEUR HAZY (A) 01/01/2020 2133   LABSPEC 1.003 (L) 01/01/2020 2133   PHURINE 6.0 01/01/2020 2133   GLUCOSEU NEGATIVE 01/01/2020 2133   HGBUR LARGE (A) 01/01/2020 2133   BILIRUBINUR NEGATIVE 01/01/2020 2133   KETONESUR 20 (A) 01/01/2020 2133  PROTEINUR 30 (A) 01/01/2020 2133   NITRITE NEGATIVE 01/01/2020 2133    LEUKOCYTESUR LARGE (A) 01/01/2020 2133   Scheduled Meds: . ARIPiprazole  5 mg Oral BID  . diazepam  5 mg Oral Q12H  . enoxaparin (LOVENOX) injection  40 mg Subcutaneous Q24H  . feeding supplement  237 mL Oral BID BM  . ferrous sulfate  325 mg Oral BID WC  . folic acid  1 mg Oral Daily  . lamoTRIgine  100 mg Oral Daily  . metoprolol tartrate  12.5 mg Oral BID  . polyethylene glycol  17 g Oral Daily  . QUEtiapine  150 mg Per Tube BID  . senna-docusate  2 tablet Oral BID   Continuous Infusions:   Procedures/Studies: DG Elbow 2 Views Left  Result Date: 01/14/2020 CLINICAL DATA:  Pain status post fall EXAM: LEFT ELBOW - 2 VIEW COMPARISON:  None. FINDINGS: There is no evidence of fracture, dislocation, or joint effusion. There is no evidence of arthropathy or other focal bone abnormality. Soft tissues are unremarkable. IMPRESSION: Negative. Electronically Signed   By: Katherine Mantle M.D.   On: 01/14/2020 19:31   CT Head Wo Contrast  Result Date: 01/01/2020 CLINICAL DATA:  Altered mental status. EXAM: CT HEAD WITHOUT CONTRAST TECHNIQUE: Contiguous axial images were obtained from the base of the skull through the vertex without intravenous contrast. COMPARISON:  December 28, 2019 FINDINGS: Brain: There is mild cerebral atrophy with widening of the extra-axial spaces and ventricular dilatation. There are areas of decreased attenuation within the white matter tracts of the supratentorial brain, consistent with microvascular disease changes. Vascular: No hyperdense vessel or unexpected calcification. Skull: Normal. Negative for fracture or focal lesion. Sinuses/Orbits: A 1.2 cm x 1.1 cm left maxillary sinus polyp versus mucous retention cyst is seen. Other: None. IMPRESSION: 1. Generalized cerebral atrophy. 2. No acute intracranial abnormality. 3. Left maxillary sinus polyp versus mucous retention cyst. Electronically Signed   By: Aram Candela M.D.   On: 01/01/2020 21:33   CT Angio Chest  PE W/Cm &/Or Wo Cm  Result Date: 01/01/2020 CLINICAL DATA:  Abnormal chest x-ray and tachycardia EXAM: CT ANGIOGRAPHY CHEST WITH CONTRAST TECHNIQUE: Multidetector CT imaging of the chest was performed using the standard protocol during bolus administration of intravenous contrast. Multiplanar CT image reconstructions and MIPs were obtained to evaluate the vascular anatomy. CONTRAST:  OMNIPAQUE IOHEXOL 350 MG/ML SOLN COMPARISON:  Radiograph same day FINDINGS: Cardiovascular: There is a optimal opacification of the pulmonary arteries. There is no central,segmental, or subsegmental filling defects within the pulmonary arteries. The heart is normal in size. No pericardial effusion or thickening. No evidence right heart strain. There is normal three-vessel brachiocephalic anatomy without proximal stenosis. The thoracic aorta is normal in appearance. Mediastinum/Nodes: No hilar, mediastinal, or axillary adenopathy. Thyroid gland, trachea, and esophagus demonstrate no significant findings. Lungs/Pleura: The lungs are clear. No pleural effusion or pneumothorax. No airspace consolidation. Upper Abdomen: No acute abnormalities present in the visualized portions of the upper abdomen. Percutaneous gastrotomy tube seen within the mid body of the stomach. Musculoskeletal: No chest wall abnormality. No acute or significant osseous findings. Review of the MIP images confirms the above findings. IMPRESSION: No central, segmental, or subsegmental pulmonary embolism. No other acute intrathoracic pathology to explain the patient's symptoms. Electronically Signed   By: Jonna Clark M.D.   On: 01/01/2020 21:33   DG Chest Portable 1 View  Result Date: 01/01/2020 CLINICAL DATA:  Increased agitation for 4 days, history of brain injury EXAM:  PORTABLE CHEST 1 VIEW COMPARISON:  Portable exam 1550 hours compared to 12/28/2019 and 09/28/2019 FINDINGS: Normal heart size, mediastinal contours, and pulmonary vascularity. New somewhat  focal but ill-defined opacity in the RIGHT mid lung question pneumonia versus superimposed artifact. Remaining lungs clear. No pleural effusion or pneumothorax. Osseous structures unremarkable. IMPRESSION: New somewhat focal but ill-defined opacity in RIGHT mid lung question pneumonia; this was not seen on the prior study of 12/28/2019 nor on an earlier exam from 09/28/2019. Radiographic follow-up until resolution recommended to exclude pulmonary nodule. Electronically Signed   By: Ulyses Southward M.D.   On: 01/01/2020 16:05    Ruther Ephraim Mariea Clonts, MD  [facility]  If 7PM-7AM, please contact night-coverage [email or web address] Password [email or web address] 01/22/2020, 9:50 AM   LOS: 21 days

## 2020-01-22 NOTE — Plan of Care (Signed)
  Problem: SLP Cognition Goals Goal: Patient will demonstrate attention to functional Description: Patient will demonstrate attention to functional task with Flowsheets (Taken 01/22/2020 1436) Patient will demonstrate _____ attention to functional task with ____:  focused  mod assist Goal: Patient will utilize external memory aids Description: Patient will utilize external memory aids to facilitate recall of information for improved safety with Flowsheets (Taken 01/22/2020 1436) Patient will utilize external memory aids to facilitate recall of ____ information for improved safety with ______:  basic  mod assist Goal: Patient will demonstrate problem solving skills Description: Patient will demonstrate problem solving skills during functional ADL's with Flowsheets (Taken 01/22/2020 1436) Patient will demonstrate _____ problem solving skills during functional ADL's with ______:  basic  mod assist  Thank you,  Havery Moros, CCC-SLP [phone]

## 2020-01-22 NOTE — Progress Notes (Signed)
Speech Language Pathology Evaluation Patient Details Name: Isaac [name] MRN: [id number] DOB: 1980-06-18 Today's Date: 01/22/2020 Time: 3220-2542 SLP Time Calculation (min) (ACUTE ONLY): 38 min  Problem List:  Patient Active Problem List   Diagnosis Date Noted  . SVT (supraventricular tachycardia) (HCC) 01/11/2020  . Acute cystitis with hematuria   . Agitation   . Cognitive deficit due to recent stroke   . Acute urinary tract infection 01/01/2020  . History of bacterial meningitis 01/01/2020  . Abnormal ECG 01/01/2020  . Normocytic anemia 01/01/2020  . Combative behavior 12/31/2019  . Gastrostomy tube in place (HCC) 12/31/2019  . Bacterial meningitis 10/04/2019  . NSTEMI (non-ST elevated myocardial infarction) (HCC) 09/28/2019  . Demand ischemia (HCC) 09/28/2019   Past Medical History:  Past Medical History:  Diagnosis Date  . History of bacterial meningitis 01/01/2020  . NSTEMI (non-ST elevated myocardial infarction) (HCC) 09/28/2019   Past Surgical History: History reviewed. No pertinent surgical history. 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 He had a G-tube placed on 10/16/2019 was discharged from [facility] on 10/18/2019. He has been receiving PT/OT as an outpatient, but I cannot find any SLP records. BSE requested.   Assessment / Plan / Recommendation Clinical Impression  Pt known to SLP service from  dysphagia intervention (now resolved) during this admission. SLE requested due to cognitive communication deficits s/p meningitis, subacute infarcts, hypoxic injury involving the bilateral caudate nucleus and putamen in July 2021. 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 Pt is able to communicate verbally with spontaneous/automatic speech (No man, Oh really?, Yeah think so...), but is unable to answer open ended questions or personally relevant questions with consistent accuracy. He follows one-step commands with visual cues only and exhibits perseveration with tasks. He was able to read two words out of four trialed. During confrontation naming task, he required binary choices for accurate responses. No family present at this time. Recommend skilled SLP services in acute setting to address the above aforementioned deficits, increase independence with communication, and decrease burden of care. Consider OT evaluation during acute stay.    SLP Assessment  SLP Recommendation/Assessment: Patient needs continued Speech Lanaguage Pathology Services SLP Visit Diagnosis: Cognitive communication deficit (R41.841);Frontal lobe and executive function deficit;Attention and concentration deficit Attention and concentration deficit following: Cerebral infarction;Other cerebrovascular disease Frontal lobe and executive function deficit following: Cerebral infarction    Follow Up Recommendations  Outpatient SLP;Skilled Nursing facility    Frequency and Duration min 2x/week  2 weeks      SLP Evaluation Cognition  Overall Cognitive Status: Impaired/Different from baseline (August 2021 meningitis, cerebral infarcts, hypoxia to putame) Arousal/Alertness: Awake/alert Orientation Level: Oriented to person;Disoriented to  place;Disoriented to time;Disoriented to situation Attention: Focused Focused Attention: Impaired Focused Attention Impairment: Verbal basic Memory: Impaired Memory Impairment: Storage deficit;Retrieval deficit Awareness: Impaired Awareness Impairment: Intellectual impairment Problem Solving: Impaired Problem Solving Impairment:  Verbal basic Executive Function: Reasoning;Sequencing;Organizing;Decision Making;Self Monitoring Reasoning: Impaired Sequencing: Impaired Organizing: Impaired Decision Making: Impaired Self Monitoring: Impaired Behaviors: Restless;Impulsive;Perseveration;Poor frustration tolerance Safety/Judgment: Impaired Rancho Mirant Scales of Cognitive Functioning: Confused/agitated       Comprehension  Auditory Comprehension Overall Auditory Comprehension: Impaired Yes/No Questions: Impaired Basic Biographical Questions: 51-75% accurate Commands: Impaired One Step Basic Commands: 50-74% accurate Conversation: Simple Interfering Components: Attention;Visual impairments;Working memory EffectiveTechniques: Visual/Gestural cues;Repetition Counsellor: Exceptions to [employer] Common Objects: Able in field of 2 Reading Comprehension Reading Status: Impaired Word level: Impaired Sentence Level: Not tested Paragraph Level: Not tested Interfering Components: Visual scanning;Attention Effective Techniques: Large print;Visual cueing    Expression Expression Primary Mode of Expression: Verbal Verbal Expression Overall Verbal Expression: Impaired Initiation: No impairment Automatic Speech: Name (mod cues for automatics) Level of Generative/Spontaneous Verbalization: Phrase Repetition: Impaired Level of Impairment: Word level Naming: Impairment Responsive: 0-25% accurate Confrontation: Impaired Common Objects: Able in field of 2 Convergent: 0-24% accurate Divergent: 0-24% accurate Verbal Errors: Perseveration;Confabulation;Not  aware of errors Pragmatics: No impairment Interfering Components: Attention Effective Techniques: Phonemic cues;Sentence completion;Written cues Non-Verbal Means of Communication: Not applicable Written Expression Dominant Hand: Right Written Expression: Not tested   Oral / Motor  Oral Motor/Sensory Function Overall Oral Motor/Sensory Function: Within functional limits Motor Speech Overall Motor Speech: Appears within functional limits for tasks assessed Respiration: Within functional limits Phonation: Normal Resonance: Within functional limits Articulation: Within functional limitis Intelligibility: Intelligible Motor Planning: Witnin functional limits Motor Speech Errors: Not applicable   Thank you,  Havery Moros, CCC-SLP [phone]                    Dmarion Perfect 01/22/2020, 2:18 PM

## 2020-01-23 NOTE — TOC Progression Note (Addendum)
Transition of Care [facility]) - Progression Note    Patient Details  Name: Isaac [name] MRN: [id number] Date of Birth: [birth year]  [facility] CM/SW Contact  Barry Brunner, LCSW Phone Number: 01/23/2020, 4:06 PM  Clinical Narrative:    CSW contacted patient's mother to follow up concerning placement. CSW LVM. TOC not able to obtain placement for patient. TOC will continue to work with patient and mother for discharge planing.      Barriers to Discharge: No SNF bed  Expected Discharge Plan and Services                                                 Social Determinants of Health (SDOH) Interventions    Readmission Risk Interventions No flowsheet data found.

## 2020-01-23 NOTE — Progress Notes (Signed)
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 He had elevated troponins over 5000.Due to combativeness, he required sedation and subsequent intubation [facility] with [facility]. He was started on ceftriaxone. Due to possible seizure, he was started on Keppra and placed on continuous EEG which was negative for seizure activity. 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 He received a 5 day course of SoluMedrol for cerebral edema. He then developed exteensor posturing with tachycardia and hypertension concerning for elevated intracranial pressure. He underwent Bolt ICP monitoring which demonstrated no elevated ICP. Follow up CT head and subsequent repeat MRI of brain showed small subacute hemorrhage in the right suboccipital lobe, either hemorrhagic conversion of small ischemic stroke vs extension of previously seen microhemorrhage, as well as hypoxic injury involving the bilateral caudate and putamen. He also sustained a NSTEMI. He was extubated on 10/10/2019 and underwent slow taper of Dilaudid to minimize withdrawal symptoms. A G-tube was inserted on 10/16/2019 and he was discharged on 10/18/2019 under the care of his mother who is a CNA.   He has been going to outpatient PT/OT and has been getting stronger. He is still taking food and medication via G-tube. He has not been able to establish care with a  PCP yet. He is easily angry and has kicked his mother. He does not talk much and often when he speaks, it may be a curse word. He is still incontinent. He requiredassistance with essential ADLs such as bathing, dressing and using toilet.  Assessment/Plan: Acuteon chronicmetabolic encephalopathy--- worsening behavioral problems superimposed on underlying anoxic brain injury -c/n Abilify 5 mg twice daily -Continue Lamictal 100 mg daily -Keppra was recently discontinued as patient's EEG was nonepileptiformand may have been contributing to increased agitation --Lorazepam or Haldol as needed agitation -Previously  discussed the risks/benefits of seroquel including its "black box warning" with patient's mother  -she expresses understanding in light of the patient's behavioral issues -She expresses that other medications and change of venue/scenery have been tried to try to provide care for the patient -in light of her inability to care for the patient and failure of other modalities, she accepts the risks with seroquel in hopes that his behavior can be controlled to allow for care of the patient -- -continuevalium bid -11/5 increased seroquel to 150 mg bid -11/6 overall less aggression but continues to have episodes of agitation -01/11/20--restraints off  -- 01/23/20 -overall now more re-directable and following commands; behaviors have improved significantly  after adjustment of medications -Awaiting placement to appropriate facility as patient's mother who is a trained CNA is no longer able to manage him at home  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  SVT/HTN -patient had intermittent episodes of SVT -controlled and resolved with the use of metoprolol  Social/Ethics -patient's mother was a trained CNA is no longer able to manage him at home due to escalating behaviors --Patient remains total care- He is still taking food and medication via G-tube. -He is easily angry, gets agitated and may kick and punch. He does not talk much. He is still incontinent. He requires assistance with essential ADLs such as bathing, dressing and using toilet -Familydoes not feel that they can safely manage the patient at home and arerequesting placement to facility. -Social work consultation to help with placement requested  chronic anemia/iron deficiency anemia -multifactorial , partly nutritional, expect further drop in H&H with hydration/hemodilution -iron sat--9% -folate 7.1 -B12--2310 -startedon po iron and folate  Left elbow pain -Patient with ongoing weakness fall out of bed on 2020-01-14 -No swelling, skin breakdown or fractures appreciated on images. -Continue to closely monitor patient's and use seated if required for safety.  Disposition/Need for in-Hospital Stay- patient unable to be discharged at this time due to -behavioral problems requiring medication adjustments, --At this time patient does not have a safe discharge plan, -Awaiting placement to appropriate facility as patient's mother who is a trained CNA is no longer able to manage him at home  Status is: Inpatient  Remains inpatient appropriate because:behavioral problems requiring medication adjustments   Status is: Inpatient  Remains inpatient appropriate because: Unsafe d/c plan   Dispo: The patient is from:Home Anticipated d/c is to:SNF versus group home. Anticipated d/c date is:To be determined. Patient currently is overall medically stable to d/c.    Family  Communication:Mother updated at bedside on 01/23/2020.   Consultants:none  Code Status: FULL   DVT Prophylaxis: [place] Lovenox   Procedures: As Listed in Progress Note Above  Antibiotics: None   Subjective: - --Mostly cooperative, however refuses needlesticks including blood draws -Eating well, having BMs -No additional new concerns  Objective: Vitals:   01/22/20 2033 01/22/20 2245 01/23/20 0500 01/23/20 0806  BP: 105/61     Pulse: (!) 113 (!) 102  (!) 102  Resp: 20     Temp: 98.1 F (36.7 C)     TempSrc:      SpO2: 100%     Weight:   87.8 kg   Height:        Intake/Output Summary (Last 24 hours) at 01/23/2020 1334 Last data filed at 01/23/2020 0900 Gross per 24 hour  Intake 1620 ml  Output 1 ml  Net 1619 ml   Weight change: 17.3 kg  Exam: General exam: Alert, awake, oriented x 1;   Respiratory system: Clear to auscultation. Respiratory effort normal.  G Cardiovascular system:RRR. No murmurs, rubs, gallops.  No JVD. Gastrointestinal system: Abdomen is nondistended, soft and nontender.  . Normal bowel sounds heard. Central nervous system: No new focal neurological deficits. Extremities: No cyanosis or clubbing. Skin: No petechiae. Psychiatry: Behaviors have improved,; for the most part able to follow commands currently.   Data Reviewed: I have personally reviewed following labs and imaging studies  Urine analysis:    Component Value Date/Time   COLORURINE YELLOW 01/01/2020 2133   APPEARANCEUR HAZY (A) 01/01/2020 2133   LABSPEC 1.003 (L) 01/01/2020 2133   PHURINE 6.0 01/01/2020 2133   GLUCOSEU NEGATIVE 01/01/2020 2133   HGBUR LARGE (A) 01/01/2020 2133   BILIRUBINUR NEGATIVE 01/01/2020 2133   KETONESUR 20 (A) 01/01/2020 2133   PROTEINUR 30 (A) 01/01/2020 2133   NITRITE NEGATIVE 01/01/2020 2133  LEUKOCYTESUR LARGE (A) 01/01/2020 2133   Scheduled Meds: . ARIPiprazole  5 mg Oral BID  . diazepam  5 mg Oral Q12H  . enoxaparin (LOVENOX)  injection  40 mg Subcutaneous Q24H  . feeding supplement  237 mL Oral BID BM  . ferrous sulfate  325 mg Oral BID WC  . folic acid  1 mg Oral Daily  . lamoTRIgine  100 mg Oral Daily  . metoprolol tartrate  12.5 mg Oral BID  . polyethylene glycol  17 g Oral Daily  . QUEtiapine  150 mg Per Tube BID  . senna-docusate  2 tablet Oral BID   Continuous Infusions:   Procedures/Studies: DG Elbow 2 Views Left  Result Date: 01/14/2020 CLINICAL DATA:  Pain status post fall EXAM: LEFT ELBOW - 2 VIEW COMPARISON:  None. FINDINGS: There is no evidence of fracture, dislocation, or joint effusion. There is no evidence of arthropathy or other focal bone abnormality. Soft tissues are unremarkable. IMPRESSION: Negative. Electronically Signed   By: Katherine Mantle M.D.   On: 01/14/2020 19:31   CT Head Wo Contrast  Result Date: 01/01/2020 CLINICAL DATA:  Altered mental status. EXAM: CT HEAD WITHOUT CONTRAST TECHNIQUE: Contiguous axial images were obtained from the base of the skull through the vertex without intravenous contrast. COMPARISON:  December 28, 2019 FINDINGS: Brain: There is mild cerebral atrophy with widening of the extra-axial spaces and ventricular dilatation. There are areas of decreased attenuation within the white matter tracts of the supratentorial brain, consistent with microvascular disease changes. Vascular: No hyperdense vessel or unexpected calcification. Skull: Normal. Negative for fracture or focal lesion. Sinuses/Orbits: A 1.2 cm x 1.1 cm left maxillary sinus polyp versus mucous retention cyst is seen. Other: None. IMPRESSION: 1. Generalized cerebral atrophy. 2. No acute intracranial abnormality. 3. Left maxillary sinus polyp versus mucous retention cyst. Electronically Signed   By: Aram Candela M.D.   On: 01/01/2020 21:33   CT Angio Chest PE W/Cm &/Or Wo Cm  Result Date: 01/01/2020 CLINICAL DATA:  Abnormal chest x-ray and tachycardia EXAM: CT ANGIOGRAPHY CHEST WITH CONTRAST  TECHNIQUE: Multidetector CT imaging of the chest was performed using the standard protocol during bolus administration of intravenous contrast. Multiplanar CT image reconstructions and MIPs were obtained to evaluate the vascular anatomy. CONTRAST:  OMNIPAQUE IOHEXOL 350 MG/ML SOLN COMPARISON:  Radiograph same day FINDINGS: Cardiovascular: There is a optimal opacification of the pulmonary arteries. There is no central,segmental, or subsegmental filling defects within the pulmonary arteries. The heart is normal in size. No pericardial effusion or thickening. No evidence right heart strain. There is normal three-vessel brachiocephalic anatomy without proximal stenosis. The thoracic aorta is normal in appearance. Mediastinum/Nodes: No hilar, mediastinal, or axillary adenopathy. Thyroid gland, trachea, and esophagus demonstrate no significant findings. Lungs/Pleura: The lungs are clear. No pleural effusion or pneumothorax. No airspace consolidation. Upper Abdomen: No acute abnormalities present in the visualized portions of the upper abdomen. Percutaneous gastrotomy tube seen within the mid body of the stomach. Musculoskeletal: No chest wall abnormality. No acute or significant osseous findings. Review of the MIP images confirms the above findings. IMPRESSION: No central, segmental, or subsegmental pulmonary embolism. No other acute intrathoracic pathology to explain the patient's symptoms. Electronically Signed   By: Jonna Clark M.D.   On: 01/01/2020 21:33   DG Chest Portable 1 View  Result Date: 01/01/2020 CLINICAL DATA:  Increased agitation for 4 days, history of brain injury EXAM: PORTABLE CHEST 1 VIEW COMPARISON:  Portable exam 1550 hours compared to 12/28/2019  and 09/28/2019 FINDINGS: Normal heart size, mediastinal contours, and pulmonary vascularity. New somewhat focal but ill-defined opacity in the RIGHT mid lung question pneumonia versus superimposed artifact. Remaining lungs clear. No pleural  effusion or pneumothorax. Osseous structures unremarkable. IMPRESSION: New somewhat focal but ill-defined opacity in RIGHT mid lung question pneumonia; this was not seen on the prior study of 12/28/2019 nor on an earlier exam from 09/28/2019. Radiographic follow-up until resolution recommended to exclude pulmonary nodule. Electronically Signed   By: Ulyses SouthwardMark  Boles M.D.   On: 01/01/2020 16:05    Lakaisha Danish Mariea ClontsEmokpae, MD  [facility]  If 7PM-7AM, please contact night-coverage [email or web address] Password [email or web address] 01/23/2020, 1:34 PM   LOS: 22 days

## 2020-01-23 NOTE — Progress Notes (Signed)
   01/23/20 1300  Assess: MEWS Score  Temp  (refused)  BP 110/75  Pulse Rate (!) 118  Resp 20  Level of Consciousness Alert  SpO2 100 %  O2 Device Room Air  Assess: MEWS Score  MEWS Temp 0  MEWS Systolic 0  MEWS Pulse 2  MEWS RR 0  MEWS LOC 0  MEWS Score 2  MEWS Score Color Yellow  Treat  Pain Scale PAINAD  Breathing 0  Negative Vocalization 0  Facial Expression 0  Body Language 0  Consolability 0  PAINAD Score 0  Take Vital Signs  Increase Vital Sign Frequency  Yellow: Q 2hr X 2 then Q 4hr X 2, if remains yellow, continue Q 4hrs  Escalate  MEWS: Escalate Yellow: discuss with charge nurse/RN and consider discussing with provider and RRT  Notify: Charge Nurse/RN  Name of Charge Nurse/RN Notified Tonna Boehringer   Date Charge Nurse/RN Notified 01/23/20  Time Charge Nurse/RN Notified 1405  Notify: Provider  Provider Name/Title Emokpae Courage   Date Provider Notified 01/23/20  Time Provider Notified 1406  Notification Type Page  Notification Reason Change in status;Other (Comment) (pulse rate elevated )  Response No new orders  Date of Provider Response 01/23/20  Time of Provider Response 1410

## 2020-01-23 NOTE — Progress Notes (Signed)
Physical Therapy Treatment Patient Details Name: Isaac [name] MRN: [id number] DOB: 1980/11/11 Today's Date: 01/23/2020    History of Present Illness Isaac [name] is a 39 y.o. male with medical history significant of history of bacterial meningitis about 3 months ago, NSTEMI due to demand ischemia during his meningitis hospitalization (see EDP note for attached discharge summary) who is brought to the emergency department by family members due to the patient's worsening mental status.  The patient's mental status has been progressively worse.  He was seen at [facility] about 4 days ago and given Geodon due to restlessness, but subsequently discharged home.  His mother has been having a very difficult time taking care of him at home.  Neurology has been trying to assist in long-term placement, but the patient is uninsured and his mother is unable to afford it.  Neurology recently discontinue Keppra and started on Lamictal to control his agitation.  He has also been given Seroquel without significant results.    PT Comments    Patient presents with his mother in room and patient agreeable for therapy with verbal/tactile cueing.  Patient demonstrates slow labored movement for sitting up at bedside requiring 2 person assistance mostly due to easily distracted requiring frequent re-direction to complete tasks, and limited to a few slow labored steps at bedside with 2 person hand held assist due to poor carryover for using RW.  Patient demonstrates improvement for following directions with SLP assisting and able to complete most exercises with voluntary movement, but requires frequent tactile cueing once fatigued.  Patient tolerated sitting up in chair with his mother present in room after therapy - RN notified.  Patient will benefit from continued physical therapy in hospital and recommended venue below to increase strength, balance, endurance for safe ADLs and gait.    Follow Up Recommendations  SNF      Equipment Recommendations  Rolling walker with 5" wheels    Recommendations for Other Services       Precautions / Restrictions Precautions Precautions: Fall Restrictions Weight Bearing Restrictions: No    Mobility  Bed Mobility Overal bed mobility: Needs Assistance Bed Mobility: Supine to Sit     Supine to sit: Mod assist     General bed mobility comments: slow labored movement with frequent verbal/tactile cueing to follow directions  Transfers Overall transfer level: Needs assistance Equipment used: 2 person hand held assist Transfers: Sit to/from Stand Sit to Stand: Mod assist;+2 physical assistance Stand pivot transfers: Mod assist;+2 physical assistance       General transfer comment: very unsteady on feet with difficulty extending trunk due to weakness  Ambulation/Gait Ambulation/Gait assistance: Mod assist;Max assist Gait Distance (Feet): 5 Feet Assistive device: Rolling walker (2 wheeled);2 person hand held assist Gait Pattern/deviations: Decreased step length - right;Decreased step length - left;Decreased stride length Gait velocity: decreased   General Gait Details: limited to 4-5 slow labored steps at bedside due to poor standing balance, BLE weakness   Stairs             Wheelchair Mobility    Modified Rankin (Stroke Patients Only)       Balance Overall balance assessment: Needs assistance Sitting-balance support: Feet supported;No upper extremity supported Sitting balance-Leahy Scale: Fair Sitting balance - Comments: fair/good seated at EOB     Standing balance-Leahy Scale: Poor Standing balance comment: fair/poor with 2 person hand held assist  Cognition Arousal/Alertness: Awake/alert Behavior During Therapy: Impulsive;Restless Overall Cognitive Status: History of cognitive impairments - at baseline                                        Exercises General Exercises -  Upper Extremity Shoulder Flexion: Seated;AAROM;Strengthening;Both;20 reps General Exercises - Lower Extremity Long Arc Quad: Seated;AAROM;Strengthening;Both;10 reps Hip Flexion/Marching: Seated;AAROM;Strengthening;Both;10 reps Heel Raises: Seated;AAROM;Strengthening;Both;10 reps    General Comments        Pertinent Vitals/Pain Pain Assessment: No/denies pain    Home Living                      Prior Function            PT Goals (current goals can now be found in the care plan section) Acute Rehab PT Goals Patient Stated Goal: not stated PT Goal Formulation: With patient/family Time For Goal Achievement: 01/29/20 Potential to Achieve Goals: Fair Progress towards PT goals: Progressing toward goals    Frequency    Min 3X/week      PT Plan Current plan remains appropriate    Co-evaluation PT/OT/SLP Co-Evaluation/Treatment: Yes   PT goals addressed during session: Mobility/safety with mobility;Proper use of DME;Balance;Strengthening/ROM        AM-PAC PT "6 Clicks" Mobility   Outcome Measure  Help needed turning from your back to your side while in a flat bed without using bedrails?: A Little Help needed moving from lying on your back to sitting on the side of a flat bed without using bedrails?: A Lot Help needed moving to and from a bed to a chair (including a wheelchair)?: A Lot Help needed standing up from a chair using your arms (e.g., wheelchair or bedside chair)?: A Lot Help needed to walk in hospital room?: A Lot Help needed climbing 3-5 steps with a railing? : Total 6 Click Score: 12    End of Session   Activity Tolerance: Patient tolerated treatment well;Patient limited by fatigue Patient left: in chair;with call bell/phone within reach;with family/visitor present Nurse Communication: Mobility status PT Visit Diagnosis: Unsteadiness on feet (R26.81);Other abnormalities of gait and mobility (R26.89);Muscle weakness (generalized) (M62.81)      Time: 1010-1043 PT Time Calculation (min) (ACUTE ONLY): 33 min  Charges:  $Therapeutic Exercise: 8-22 mins $Therapeutic Activity: 8-22 mins                     11:12 AM, 01/23/20 Ocie Bob, MPT Physical Therapist with [facility] [phone] office [phone] mobile phone

## 2020-01-23 NOTE — Progress Notes (Addendum)
  Speech Language Pathology Treatment: Cognitive-Linquistic  Patient Details Name: Isaac [name] MRN: [id number] DOB: Apr 21, 1980 Today's Date: 01/23/2020 Time: 1010-1043 SLP Time Calculation (min) (ACUTE ONLY): 33 min  Assessment / Plan / Recommendation Clinical Impression  Ongoing cognitive linguistic therapy was provided today during co-treatment with PT in order to facilitate successful communication and participation while assessing and incorporating functional attention tasks. Upon SLP entering the room Pt spontaneously looked at SLP's shoes and said "I like those! - cool, how much they cost?" Pt engaged in conversation SLP noting spontaneous production around a topic he enjoys (SHOES - mother reports he has lots of tennis shoes). Majority of discussion after those initial phrases were automatic phrases, "yeah" and holding the thumbs up.  Pt with poor attention with exercises initially (NT was making the bed, mother talking with NT across the room, TV on, etc.) SLP provided education that the environment was not conducive to success for the patient (associated with Ranchos level IV - V - short attention span/highly distractible).  After distractions were removed note significant improvement and participation, and ability to follow commands. Pt demonstrated attention during PT exercises requiring mod/max verbal cues and min/mod tactile cues, seemingly required to localize which extremity to move despite visual cue provided by PT. Some perseveration was noted when attempting to move to another exercise. After PT exercises were complete, SLP attempted to provide more structured ST with pictures and written words, however, he was falling asleep and became agitated (also associated with his current Ranchos level) as SLP attempted to rouse him. SLP provided written exercises and reviewed them with Pt's mom, encouraging her to complete them with him whenever appropriate. 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 He had a G-tube placed on 10/16/2019 was [facility] 10[facility]18/[facility]. He has been receiving PT/OT as an outpatient, but I cannot find any SLP records. BSE requested.      SLP Plan  Continue with current plan of care       Recommendations                 Oral Care Recommendations: Oral care BID;Staff/trained caregiver to provide oral care Follow up Recommendations: Outpatient SLP;Skilled Nursing facility SLP Visit Diagnosis: Cognitive communication deficit (R41.841);Frontal lobe and executive function deficit;Attention and concentration deficit Attention and concentration deficit following: Cerebral infarction;Other cerebrovascular disease Frontal lobe and executive function deficit following: Cerebral infarction Plan: Continue with current plan of care       Nachum Derossett H. Romie Levee, CCC-SLP Speech Language Pathologist    Georgetta Haber 01/23/2020, 12:25 PM

## 2020-01-23 NOTE — Progress Notes (Signed)
Multiple attempts at getting up and in danger of falling.  Haldol IM given along with snacks

## 2020-01-23 NOTE — Progress Notes (Signed)
   01/23/20 1300  Assess: MEWS Score  Temp  (refused)  BP 110/75  Pulse Rate (!) 118  Resp 20  Level of Consciousness Alert  SpO2 100 %  O2 Device Room Air  Assess: MEWS Score  MEWS Temp 0  MEWS Systolic 0  MEWS Pulse 2  MEWS RR 0  MEWS LOC 0  MEWS Score 2  MEWS Score Color Yellow  Treat  Pain Scale PAINAD  Breathing 0  Negative Vocalization 0  Facial Expression 0  Body Language 0  Consolability 0  PAINAD Score 0  Take Vital Signs  Increase Vital Sign Frequency  Yellow: Q 2hr X 2 then Q 4hr X 2, if remains yellow, continue Q 4hrs  Escalate  MEWS: Escalate Yellow: discuss with charge nurse/RN and consider discussing with provider and RRT  Notify: Charge Nurse/RN  Name of Charge Nurse/RN Notified Tonna Boehringer   Date Charge Nurse/RN Notified 01/23/20  Time Charge Nurse/RN Notified 1405  Notify: Provider  Provider Name/Title Emokpae Courage   Date Provider Notified 01/23/20  Time Provider Notified 1406  Notification Type Page  Notification Reason Change in status;Other (Comment) (pulse rate elevated )

## 2020-01-24 NOTE — Progress Notes (Signed)
Pt's heart rate currently 110-115/min. Pt had incontinent stool and urine in bed, staff just finished cleaning/bathing pt and changing bed linens. Pt also c/o low back pain with movement. PRN Tylenol and scheduled lopressor given. Pt with no other complaints, cooperative and pleasant. Watching TV and eating ice cream at present.

## 2020-01-24 NOTE — Progress Notes (Signed)
Pt has been sleeping since oral Tylenol and other am meds administered. Pt refuses to allow me to get B/P but did allow me to count radial pulse. Pt states, "I'm good, I'm good." Calm, otherwise cooperative.

## 2020-01-24 NOTE — Progress Notes (Signed)
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 He had elevated troponins over 5000.Due to combativeness, he required sedation and subsequent intubation [facility]. He was started on ceftriaxone. Due to possible seizure, he was started on Keppra and placed on continuous EEG which was negative for seizure activity. 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 He received a 5 day course of SoluMedrol for cerebral edema. He then developed exteensor posturing with tachycardia and hypertension concerning for elevated intracranial pressure. He underwent Bolt ICP monitoring which demonstrated no elevated ICP. Follow up CT head and subsequent repeat MRI of brain showed small subacute hemorrhage in the right suboccipital lobe, either hemorrhagic conversion of small ischemic stroke vs extension of previously seen microhemorrhage, as well as hypoxic injury involving the bilateral caudate and putamen. He also sustained a NSTEMI. He was extubated on 10/10/2019 and underwent slow taper of Dilaudid to minimize withdrawal symptoms. A G-tube was inserted on 10/16/2019 and he was discharged on 10/18/2019 under the care of his mother who is a CNA.   He has been going to outpatient PT/OT and has been getting stronger. He is still taking food and medication via G-tube. He has not been able to establish care with a  PCP yet. He is easily angry and has kicked his mother. He does not talk much and often when he speaks, it may be a curse word. He is still incontinent. He requiredassistance with essential ADLs such as bathing, dressing and using toilet.  Assessment/Plan: Acuteon chronicmetabolic encephalopathy--- worsening behavioral problems superimposed on underlying anoxic brain injury -c/n Abilify 5 mg twice daily -Continue Lamictal 100 mg daily -Keppra was recently discontinued as patient's EEG was nonepileptiformand may have been contributing to increased agitation --Lorazepam or Haldol as needed agitation -Previously  discussed the risks/benefits of seroquel including its "black box warning" with patient's mother  -she expresses understanding in light of the patient's behavioral issues -She expresses that other medications and change of venue/scenery have been tried to try to provide care for the patient -in light of her inability to care for the patient and failure of other modalities, she accepts the risks with seroquel in hopes that his behavior can be controlled to allow for care of the patient -- -continuevalium bid -11/5 increased seroquel to 150 mg bid -11/6 overall less aggression but continues to have episodes of agitation -01/11/20--restraints off  -- 01/24/20 -overall now more re-directable and following commands; behaviors have improved significantly  after adjustment of medications -Awaiting placement to appropriate facility as patient's mother who is a trained CNA is no longer able to manage him at home  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  SVT/HTN -patient had intermittent episodes of SVT -controlled and resolved with the use of metoprolol  Social/Ethics -patient's mother was a trained CNA is no longer able to manage him at home due to escalating behaviors --Patient remains total care- He is still taking food and medication via G-tube. He does not talk much. He is still incontinent. He requires assistance with essential ADLs such as bathing, dressing and using toilet -Familydoes not feel that they can safely manage the patient at home and arerequesting placement to facility. -Social work consultation to help with placement requested  chronic anemia/iron deficiency anemia -multifactorial , partly nutritional, expect further drop in H&H with hydration/hemodilution -iron sat--9% -folate 7.1 -B12--2310 -c/n  iron and folate  Left elbow pain -Patient with ongoing weakness fall out of bed on 2020-01-14 -No swelling, skin breakdown or fractures appreciated on images. -Continue to closely monitor patient's and use seated if required for safety.  Disposition/Need for in-Hospital Stay- patient unable to be discharged at this time due to -behavioral problems requiring placement to appropriate facility --At this time patient does not have a safe discharge plan, -Awaiting placement to appropriate facility as patient's mother who is a trained CNA is no longer able to manage him at home  Status is: Inpatient  Remains inpatient appropriate because:behavioral problems-no safe discharge plan, awaiting transfer/placement to appropriate facility  Dispo: The patient is from:Home Anticipated d/c is to:SNF versus group home. Anticipated d/c date is:To be determined. Patient currently is overall medically stable to d/c.    Family Communication:Mother updated at bedside on 01/23/2020.   Consultants:none  Code Status: FULL   DVT  Prophylaxis: [place] Lovenox   Procedures: As Listed in Progress Note Above  Antibiotics: None   Subjective: - -Had episode of restlessness and agitation overnight, required IM Haldol x1 --Continue to eat well -No new concerns  Objective: Vitals:   01/23/20 1854 01/24/20 0308 01/24/20 0600 01/24/20 1025  BP: (!) 106/58  (!) 106/55 115/67  Pulse: (!) 118  100 (!) 112  Resp: 19  18 18   Temp: 98.2 F (36.8 C)     TempSrc:      SpO2: 100%  99% 100%  Weight:  87.9 kg    Height:       No intake or output data in the 24 hours ending 01/24/20 1106 Weight change: 0.1 kg  Exam: General exam: Alert, awake, oriented x 1;   Respiratory system: Clear to auscultation. Respiratory effort normal.  G Cardiovascular system:RRR. No murmurs, rubs, gallops.  No JVD. Gastrointestinal system: Abdomen is nondistended, soft and nontender.  . Normal bowel sounds heard. Central nervous system: No new focal neurological deficits. Extremities: No cyanosis or clubbing. Skin: No petechiae. Psychiatry: Behaviors have improved,; for the most part able to follow commands currently.   Data Reviewed: I have personally reviewed following labs and imaging studies  Urine analysis:    Component Value Date/Time   COLORURINE YELLOW 01/01/2020 2133   APPEARANCEUR HAZY (A) 01/01/2020 2133   LABSPEC 1.003 (L) 01/01/2020 2133   PHURINE 6.0 01/01/2020 2133   GLUCOSEU NEGATIVE 01/01/2020 2133   HGBUR LARGE (A) 01/01/2020 2133   BILIRUBINUR NEGATIVE 01/01/2020 2133   KETONESUR 20 (A) 01/01/2020 2133   PROTEINUR 30 (A) 01/01/2020 2133   NITRITE NEGATIVE 01/01/2020 2133   LEUKOCYTESUR LARGE (A) 01/01/2020 2133   Scheduled Meds: . ARIPiprazole  5 mg Oral BID  . diazepam  5 mg Oral Q12H  . enoxaparin (LOVENOX) injection  40 mg Subcutaneous Q24H  .  feeding supplement  237 mL Oral BID BM  . ferrous sulfate  325 mg Oral BID WC  . folic acid  1 mg Oral Daily  . lamoTRIgine  100 mg Oral Daily  . metoprolol  tartrate  12.5 mg Oral BID  . polyethylene glycol  17 g Oral Daily  . QUEtiapine  150 mg Per Tube BID  . senna-docusate  2 tablet Oral BID   Continuous Infusions:   Procedures/Studies: DG Elbow 2 Views Left  Result Date: 01/14/2020 CLINICAL DATA:  Pain status post fall EXAM: LEFT ELBOW - 2 VIEW COMPARISON:  None. FINDINGS: There is no evidence of fracture, dislocation, or joint effusion. There is no evidence of arthropathy or other focal bone abnormality. Soft tissues are unremarkable. IMPRESSION: Negative. Electronically Signed   By: Katherine Mantle M.D.   On: 01/14/2020 19:31   CT Head Wo Contrast  Result Date: 01/01/2020 CLINICAL DATA:  Altered mental status. EXAM: CT HEAD WITHOUT CONTRAST TECHNIQUE: Contiguous axial images were obtained from the base of the skull through the vertex without intravenous contrast. COMPARISON:  December 28, 2019 FINDINGS: Brain: There is mild cerebral atrophy with widening of the extra-axial spaces and ventricular dilatation. There are areas of decreased attenuation within the white matter tracts of the supratentorial brain, consistent with microvascular disease changes. Vascular: No hyperdense vessel or unexpected calcification. Skull: Normal. Negative for fracture or focal lesion. Sinuses/Orbits: A 1.2 cm x 1.1 cm left maxillary sinus polyp versus mucous retention cyst is seen. Other: None. IMPRESSION: 1. Generalized cerebral atrophy. 2. No acute intracranial abnormality. 3. Left maxillary sinus polyp versus mucous retention cyst. Electronically Signed   By: Aram Candela M.D.   On: 01/01/2020 21:33   CT Angio Chest PE W/Cm &/Or Wo Cm  Result Date: 01/01/2020 CLINICAL DATA:  Abnormal chest x-ray and tachycardia EXAM: CT ANGIOGRAPHY CHEST WITH CONTRAST TECHNIQUE: Multidetector CT imaging of the chest was performed using the standard protocol during bolus administration of intravenous contrast. Multiplanar CT image reconstructions and MIPs were obtained  to evaluate the vascular anatomy. CONTRAST:  OMNIPAQUE IOHEXOL 350 MG/ML SOLN COMPARISON:  Radiograph same day FINDINGS: Cardiovascular: There is a optimal opacification of the pulmonary arteries. There is no central,segmental, or subsegmental filling defects within the pulmonary arteries. The heart is normal in size. No pericardial effusion or thickening. No evidence right heart strain. There is normal three-vessel brachiocephalic anatomy without proximal stenosis. The thoracic aorta is normal in appearance. Mediastinum/Nodes: No hilar, mediastinal, or axillary adenopathy. Thyroid gland, trachea, and esophagus demonstrate no significant findings. Lungs/Pleura: The lungs are clear. No pleural effusion or pneumothorax. No airspace consolidation. Upper Abdomen: No acute abnormalities present in the visualized portions of the upper abdomen. Percutaneous gastrotomy tube seen within the mid body of the stomach. Musculoskeletal: No chest wall abnormality. No acute or significant osseous findings. Review of the MIP images confirms the above findings. IMPRESSION: No central, segmental, or subsegmental pulmonary embolism. No other acute intrathoracic pathology to explain the patient's symptoms. Electronically Signed   By: Jonna Clark M.D.   On: 01/01/2020 21:33   DG Chest Portable 1 View  Result Date: 01/01/2020 CLINICAL DATA:  Increased agitation for 4 days, history of brain injury EXAM: PORTABLE CHEST 1 VIEW COMPARISON:  Portable exam 1550 hours compared to 12/28/2019 and 09/28/2019 FINDINGS: Normal heart size, mediastinal contours, and pulmonary vascularity. New somewhat focal but ill-defined opacity in the RIGHT mid lung question pneumonia versus superimposed artifact. Remaining lungs clear. No pleural effusion or pneumothorax. Osseous  structures unremarkable. IMPRESSION: New somewhat focal but ill-defined opacity in RIGHT mid lung question pneumonia; this was not seen on the prior study of 12/28/2019 nor on  an earlier exam from 09/28/2019. Radiographic follow-up until resolution recommended to exclude pulmonary nodule. Electronically Signed   By: Ulyses Southward M.D.   On: 01/01/2020 16:05    Brentlee Sciara Mariea Clonts, MD  [facility]  If 7PM-7AM, please contact night-coverage [email or web address] Password [email or web address] 01/24/2020, 11:06 AM   LOS: 23 days

## 2020-01-25 NOTE — TOC Progression Note (Signed)
Transition of Care [facility]) - Progression Note    Patient Details  Name: Isaac [name] MRN: [id number] Date of Birth: Dec 31, 1980  Transition of Care [facility]) CM/SW Contact  Barry Brunner, LCSW Phone Number: 01/25/2020, 5:13 PM  Clinical Narrative:    CSW followed up with patient's mother to discuss discharge and needed support. Patient's mother continued to state that she will not be able to take him back home. CSW discussed option for placement once disability is received. Patient's mother reported that she could not take patient back even for short term and that she is moving out of her home and with her daughter. TOC to follow.      Barriers to Discharge: No SNF bed  Expected Discharge Plan and Services           Expected Discharge Date: 01/24/20                                     Social Determinants of Health (SDOH) Interventions    Readmission Risk Interventions No flowsheet data found.

## 2020-01-25 NOTE — Progress Notes (Signed)
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 He had elevated troponins over 5000.Due to combativeness, he required sedation and subsequent intubation [facility] and diagnosed with pneumococcal meningitis. He was started on ceftriaxone. Due to possible seizure, he was started on Keppra and placed on continuous EEG which was negative for seizure activity. 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 He received a 5 day course of SoluMedrol for cerebral edema. He then developed exteensor posturing with tachycardia and hypertension concerning for elevated intracranial pressure. He underwent Bolt ICP monitoring which demonstrated no elevated ICP. Follow up CT head and subsequent repeat MRI of brain showed small subacute hemorrhage in the right suboccipital lobe, either hemorrhagic conversion of small ischemic stroke vs extension of previously seen microhemorrhage, as well as hypoxic injury involving the bilateral caudate and putamen. He also sustained a NSTEMI. He was extubated on 10/10/2019 and underwent slow taper of Dilaudid to minimize withdrawal symptoms. A G-tube was inserted on 10/16/2019 and he was discharged on 10/18/2019 under the care of his mother who is a CNA.   He has been going to outpatient PT/OT and has been getting stronger. He is still taking food and medication via G-tube. He has not been able to establish care with a  PCP yet. He is easily angry and has kicked his mother. He does not talk much and often when he speaks, it may be a curse word. He is still incontinent. He requiredassistance with essential ADLs such as bathing, dressing and using toilet.  Assessment/Plan: Acuteon chronicmetabolic encephalopathy--- worsening behavioral problems superimposed on underlying anoxic brain injury -c/n Abilify 5 mg twice daily -Continue Lamictal 100 mg daily -Keppra was recently discontinued as patient's EEG was nonepileptiformand may have been contributing to increased agitation --Lorazepam or Haldol as needed agitation -Previously  discussed the risks/benefits of seroquel including its "black box warning" with patient's mother  -she expresses understanding in light of the patient's behavioral issues -She expresses that other medications and change of venue/scenery have been tried to try to provide care for the patient -in light of her inability to care for the patient and failure of other modalities, she accepts the risks with seroquel in hopes that his behavior can be controlled to allow for care of the patient -- -continuevalium bid -11/5 increased seroquel to 150 mg bid -11/6 overall less aggression but continues to have episodes of agitation -01/11/20--restraints off  -- 01/25/20 -overall now more re-directable and following commands; behaviors have improved significantly  after adjustment of medications -Patient remains medically stable, he is awaiting placement to appropriate facility as patient's mother who is a trained CNA is no longer able to manage him at home  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  SVT/HTN -patient had intermittent episodes of SVT -controlled and resolved with the use of metoprolol  Social/Ethics -patient's mother was a trained CNA is no longer able to manage him at home due to escalating behaviors --Patient remains total care- He is still taking food and medication via G-tube. He does not talk much. He is still incontinent. He requires assistance with essential ADLs such as bathing, dressing and using toilet -Familydoes not feel that they can safely manage the patient at home and arerequesting placement to facility. -Social work consultation to help with placement requested  chronic anemia/iron deficiency anemia -multifactorial , partly nutritional, expect further drop in H&H with hydration/hemodilution -iron sat--9% -folate 7.1 -B12--2310 -c/n  iron and folate  Left elbow pain -Patient with ongoing weakness fall out of bed on 2020-01-14 -No swelling, skin breakdown or fractures appreciated on images. -Continue to closely monitor patient's and use seated if required for safety.  Disposition/Need for in-Hospital Stay- patient unable to be discharged at this time due to -behavioral problems requiring placement to appropriate facility --At this time patient does not have a safe discharge plan, -Awaiting placement to appropriate facility as patient's mother who is a trained CNA is no longer able to manage him at home  Status is: Inpatient  Remains inpatient appropriate because:behavioral problems-no safe discharge plan, awaiting transfer/placement to appropriate facility  Dispo: The patient is from:Home Anticipated d/c is to:SNF versus group home. Anticipated d/c date is:To be determined. Patient currently is overall medically stable to d/c.    Family Communication:Mother updated at bedside on 01/23/2020.    Consultants:none  Code Status: FULL   DVT Prophylaxis: Boyle Lovenox   Procedures: As Listed in Progress Note Above  Antibiotics: None   Subjective: - -Patient remains medically stable, he is awaiting placement to appropriate facility as patient's mother who is a trained CNA is no longer able to manage him at home  --No new concerns  Objective: Vitals:   01/24/20 0600 01/24/20 1025 01/24/20 1200 01/24/20 2330  BP: (!) 106/55 115/67  120/71  Pulse: 100 (!) 112 (!) 101 97  Resp: 18 18 16 20   Temp:    98.6 F (37 C)  TempSrc:    Oral  SpO2: 99% 100%  100%  Weight:      Height:        Intake/Output Summary (Last 24 hours) at 01/25/2020 1014 Last data filed at 01/25/2020 0700 Gross per 24 hour  Intake --  Output 500 ml  Net -500 ml   Weight change:   Exam: General exam: Alert, awake, oriented x 1;   Respiratory system: Clear to auscultation. Respiratory effort normal.  G Cardiovascular system:RRR. No murmurs, rubs, gallops.  No JVD. Gastrointestinal system: Abdomen is nondistended, soft and nontender.  . Normal bowel sounds heard. Central nervous system: No new focal neurological deficits. Extremities: No cyanosis or clubbing. Skin: No petechiae. Psychiatry: Behaviors have improved,; for the most part able to follow commands currently.   Data Reviewed: I have personally reviewed following labs and imaging studies  Urine analysis:    Component Value Date/Time   COLORURINE YELLOW 01/01/2020 2133   APPEARANCEUR HAZY (A) 01/01/2020 2133   LABSPEC 1.003 (L) 01/01/2020 2133   PHURINE 6.0 01/01/2020 2133   GLUCOSEU NEGATIVE 01/01/2020 2133   HGBUR LARGE (A) 01/01/2020 2133   BILIRUBINUR NEGATIVE 01/01/2020 2133   KETONESUR 20 (A) 01/01/2020 2133   PROTEINUR 30 (A) 01/01/2020 2133   NITRITE NEGATIVE 01/01/2020 2133  LEUKOCYTESUR LARGE (A) 01/01/2020 2133   Scheduled Meds: . ARIPiprazole  5 mg Oral BID  . diazepam  5 mg Oral Q12H  . enoxaparin  (LOVENOX) injection  40 mg Subcutaneous Q24H  . feeding supplement  237 mL Oral BID BM  . ferrous sulfate  325 mg Oral BID WC  . folic acid  1 mg Oral Daily  . lamoTRIgine  100 mg Oral Daily  . metoprolol tartrate  12.5 mg Oral BID  . polyethylene glycol  17 g Oral Daily  . QUEtiapine  150 mg Per Tube BID  . senna-docusate  2 tablet Oral BID   Continuous Infusions:   Procedures/Studies: DG Elbow 2 Views Left  Result Date: 01/14/2020 CLINICAL DATA:  Pain status post fall EXAM: LEFT ELBOW - 2 VIEW COMPARISON:  None. FINDINGS: There is no evidence of fracture, dislocation, or joint effusion. There is no evidence of arthropathy or other focal bone abnormality. Soft tissues are unremarkable. IMPRESSION: Negative. Electronically Signed   By: Katherine Mantle M.D.   On: 01/14/2020 19:31   CT Head Wo Contrast  Result Date: 01/01/2020 CLINICAL DATA:  Altered mental status. EXAM: CT HEAD WITHOUT CONTRAST TECHNIQUE: Contiguous axial images were obtained from the base of the skull through the vertex without intravenous contrast. COMPARISON:  December 28, 2019 FINDINGS: Brain: There is mild cerebral atrophy with widening of the extra-axial spaces and ventricular dilatation. There are areas of decreased attenuation within the white matter tracts of the supratentorial brain, consistent with microvascular disease changes. Vascular: No hyperdense vessel or unexpected calcification. Skull: Normal. Negative for fracture or focal lesion. Sinuses/Orbits: A 1.2 cm x 1.1 cm left maxillary sinus polyp versus mucous retention cyst is seen. Other: None. IMPRESSION: 1. Generalized cerebral atrophy. 2. No acute intracranial abnormality. 3. Left maxillary sinus polyp versus mucous retention cyst. Electronically Signed   By: Aram Candela M.D.   On: 01/01/2020 21:33   CT Angio Chest PE W/Cm &/Or Wo Cm  Result Date: 01/01/2020 CLINICAL DATA:  Abnormal chest x-ray and tachycardia EXAM: CT ANGIOGRAPHY CHEST WITH  CONTRAST TECHNIQUE: Multidetector CT imaging of the chest was performed using the standard protocol during bolus administration of intravenous contrast. Multiplanar CT image reconstructions and MIPs were obtained to evaluate the vascular anatomy. CONTRAST:  OMNIPAQUE IOHEXOL 350 MG/ML SOLN COMPARISON:  Radiograph same day FINDINGS: Cardiovascular: There is a optimal opacification of the pulmonary arteries. There is no central,segmental, or subsegmental filling defects within the pulmonary arteries. The heart is normal in size. No pericardial effusion or thickening. No evidence right heart strain. There is normal three-vessel brachiocephalic anatomy without proximal stenosis. The thoracic aorta is normal in appearance. Mediastinum/Nodes: No hilar, mediastinal, or axillary adenopathy. Thyroid gland, trachea, and esophagus demonstrate no significant findings. Lungs/Pleura: The lungs are clear. No pleural effusion or pneumothorax. No airspace consolidation. Upper Abdomen: No acute abnormalities present in the visualized portions of the upper abdomen. Percutaneous gastrotomy tube seen within the mid body of the stomach. Musculoskeletal: No chest wall abnormality. No acute or significant osseous findings. Review of the MIP images confirms the above findings. IMPRESSION: No central, segmental, or subsegmental pulmonary embolism. No other acute intrathoracic pathology to explain the patient's symptoms. Electronically Signed   By: Jonna Clark M.D.   On: 01/01/2020 21:33   DG Chest Portable 1 View  Result Date: 01/01/2020 CLINICAL DATA:  Increased agitation for 4 days, history of brain injury EXAM: PORTABLE CHEST 1 VIEW COMPARISON:  Portable exam 1550 hours compared to 12/28/2019  and 09/28/2019 FINDINGS: Normal heart size, mediastinal contours, and pulmonary vascularity. New somewhat focal but ill-defined opacity in the RIGHT mid lung question pneumonia versus superimposed artifact. Remaining lungs clear. No  pleural effusion or pneumothorax. Osseous structures unremarkable. IMPRESSION: New somewhat focal but ill-defined opacity in RIGHT mid lung question pneumonia; this was not seen on the prior study of 12/28/2019 nor on an earlier exam from 09/28/2019. Radiographic follow-up until resolution recommended to exclude pulmonary nodule. Electronically Signed   By: Ulyses SouthwardMark  Boles M.D.   On: 01/01/2020 16:05    Brittan Butterbaugh Mariea ClontsEmokpae, MD  [facility]  If 7PM-7AM, please contact night-coverage [email or web address] Password [email or web address] 01/25/2020, 10:14 AM   LOS: 24 days

## 2020-01-26 NOTE — Progress Notes (Signed)
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 He had elevated troponins over 5000.Due to combativeness, he required sedation and subsequent intubation [facility] and diagnosed with pneumococcal meningitis. He was started on ceftriaxone. Due to possible seizure, he was started on Keppra and placed on continuous EEG which was negative for seizure activity. 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 He received a 5 day course of SoluMedrol for cerebral edema. He then developed exteensor posturing with tachycardia and hypertension concerning for elevated intracranial pressure. He underwent Bolt ICP monitoring which demonstrated no elevated ICP. Follow up CT head and subsequent repeat MRI of brain showed small subacute hemorrhage in the right suboccipital lobe, either hemorrhagic conversion of small ischemic stroke vs extension of previously seen microhemorrhage, as well as hypoxic injury involving the bilateral caudate and putamen. He also sustained a NSTEMI. He was extubated on 10/10/2019 and underwent slow taper of Dilaudid to minimize withdrawal symptoms. A G-tube was inserted on 10/16/2019 and he was discharged on 10/18/2019 under the care of his mother who is a CNA.   He has been going to outpatient PT/OT and has been getting stronger. He is still taking food and medication via G-tube. He has not been able to establish care with a  PCP yet. He is easily angry and has kicked his mother. He does not talk much and often when he speaks, it may be a curse word. He is still incontinent. He requiredassistance with essential ADLs such as bathing, dressing and using toilet.  Assessment/Plan: Acuteon chronicmetabolic encephalopathy--- worsening behavioral problems superimposed on underlying anoxic brain injury -c/n Abilify 5 mg twice daily -Continue Lamictal 100 mg daily -Keppra was recently discontinued as patient's EEG was nonepileptiformand may have been contributing to increased agitation --Lorazepam or Haldol as needed agitation -Previously  discussed the risks/benefits of seroquel including its "black box warning" with patient's mother  -she expresses understanding in light of the patient's behavioral issues -She expresses that other medications and change of venue/scenery have been tried to try to provide care for the patient -in light of her inability to care for the patient and failure of other modalities, she accepts the risks with seroquel in hopes that his behavior can be controlled to allow for care of the patient -- -continuevalium bid -11/5 increased seroquel to 150 mg bid -11/6 overall less aggression but continues to have episodes of agitation -01/11/20--restraints off  -- 01/26/20 -overall now more re-directable and following commands; behaviors have improved significantly  after adjustment of medications -Patient remains medically stable, he is awaiting placement to appropriate facility as patient's mother who is a trained CNA is no longer able to manage him at home  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  SVT/HTN -patient had intermittent episodes of SVT -controlled and resolved with the use of metoprolol  Social/Ethics -patient's mother was a trained CNA is no longer able to manage him at home due to escalating behaviors --Patient remains total care- He is still taking food and medication via G-tube. He does not talk much. He is still incontinent. He requires assistance with essential ADLs such as bathing, dressing and using toilet -Familydoes not feel that they can safely manage the patient at home and arerequesting placement to facility. -Social work consultation to help with placement requested  chronic anemia/iron deficiency anemia -multifactorial , partly nutritional, expect further drop in H&H with hydration/hemodilution -iron sat--9% -folate 7.1 -B12--2310 -c/n  iron and folate  Left elbow pain -Patient with ongoing weakness fall out of bed on 2020-01-14 -No swelling, skin breakdown or fractures appreciated on images. -Continue to closely monitor patient's and use seated if required for safety.  Disposition/Need for in-Hospital Stay- patient unable to be discharged at this time due to -behavioral problems requiring placement to appropriate facility --At this time patient does not have a safe discharge plan, -Awaiting placement to appropriate facility as patient's mother who is a trained CNA is no longer able to manage him at home  Status is: Inpatient  Remains inpatient appropriate because:behavioral problems-no safe discharge plan, awaiting transfer/placement to appropriate facility  Dispo: The patient is from:Home Anticipated d/c is to:SNF versus group home. Anticipated d/c date is:To be determined. Patient currently is overall medically stable to d/c.    Family Communication:Mother updated at bedside on 01/23/2020.    Consultants:none  Code Status: FULL   DVT Prophylaxis: [place] Lovenox   Procedures: As Listed in Progress Note Above  Antibiotics: None   Subjective: - 01/26/20 -Patient remains medically stable for discharge --he is awaiting placement to appropriate facility as patient's mother who is a trained CNA is no longer able to manage him at home  --No new concerns -Continues to eat well  Objective: Vitals:   01/25/20 1550 01/25/20 1800 01/25/20 2032 01/26/20 0324  BP: 129/82 130/81 (!) 144/86   Pulse: (!) 120 84 (!) 109   Resp: 17 16 20    Temp:   97.8 F (36.6 C)   TempSrc:      SpO2: 100% 98% 99%   Weight:    88.1 kg  Height:        Intake/Output Summary (Last 24 hours) at 01/26/2020 0942 Last data filed at 01/25/2020 1700 Gross per 24 hour  Intake 480 ml  Output --  Net 480 ml   Weight change:   Exam: General exam: Alert, awake, oriented x 1;   Respiratory system: Clear to auscultation. Respiratory effort normal.   Cardiovascular system:RRR. No murmurs, rubs, gallops.  No JVD. Gastrointestinal system: Abdomen is nondistended, soft and nontender.  . Normal bowel sounds heard. Central nervous system: No additional new focal neurological deficits. Extremities: No cyanosis or clubbing. Skin: No petechiae. Psychiatry: Behaviors have improved,; for the most part able to follow commands currently.   Data Reviewed: I have personally reviewed following labs and imaging studies  Urine analysis:    Component Value Date/Time   COLORURINE YELLOW 01/01/2020 2133   APPEARANCEUR HAZY (A) 01/01/2020 2133   LABSPEC 1.003 (L) 01/01/2020 2133   PHURINE 6.0 01/01/2020 2133   GLUCOSEU NEGATIVE 01/01/2020 2133   HGBUR LARGE (A) 01/01/2020 2133   BILIRUBINUR NEGATIVE 01/01/2020 2133   KETONESUR 20 (A) 01/01/2020 2133   PROTEINUR 30 (A)  01/01/2020 2133   NITRITE NEGATIVE 01/01/2020 2133   LEUKOCYTESUR LARGE (A) 01/01/2020 2133   Scheduled Meds: . ARIPiprazole   5 mg Oral BID  . diazepam  5 mg Oral Q12H  . enoxaparin (LOVENOX) injection  40 mg Subcutaneous Q24H  . feeding supplement  237 mL Oral BID BM  . ferrous sulfate  325 mg Oral BID WC  . folic acid  1 mg Oral Daily  . lamoTRIgine  100 mg Oral Daily  . metoprolol tartrate  12.5 mg Oral BID  . polyethylene glycol  17 g Oral Daily  . QUEtiapine  150 mg Per Tube BID  . senna-docusate  2 tablet Oral BID   Continuous Infusions:   Procedures/Studies: DG Elbow 2 Views Left  Result Date: 01/14/2020 CLINICAL DATA:  Pain status post fall EXAM: LEFT ELBOW - 2 VIEW COMPARISON:  None. FINDINGS: There is no evidence of fracture, dislocation, or joint effusion. There is no evidence of arthropathy or other focal bone abnormality. Soft tissues are unremarkable. IMPRESSION: Negative. Electronically Signed   By: Katherine Mantle M.D.   On: 01/14/2020 19:31   CT Head Wo Contrast  Result Date: 01/01/2020 CLINICAL DATA:  Altered mental status. EXAM: CT HEAD WITHOUT CONTRAST TECHNIQUE: Contiguous axial images were obtained from the base of the skull through the vertex without intravenous contrast. COMPARISON:  December 28, 2019 FINDINGS: Brain: There is mild cerebral atrophy with widening of the extra-axial spaces and ventricular dilatation. There are areas of decreased attenuation within the white matter tracts of the supratentorial brain, consistent with microvascular disease changes. Vascular: No hyperdense vessel or unexpected calcification. Skull: Normal. Negative for fracture or focal lesion. Sinuses/Orbits: A 1.2 cm x 1.1 cm left maxillary sinus polyp versus mucous retention cyst is seen. Other: None. IMPRESSION: 1. Generalized cerebral atrophy. 2. No acute intracranial abnormality. 3. Left maxillary sinus polyp versus mucous retention cyst. Electronically Signed   By: Aram Candela M.D.   On: 01/01/2020 21:33   CT Angio Chest PE W/Cm &/Or Wo Cm  Result Date: 01/01/2020 CLINICAL DATA:  Abnormal chest  x-ray and tachycardia EXAM: CT ANGIOGRAPHY CHEST WITH CONTRAST TECHNIQUE: Multidetector CT imaging of the chest was performed using the standard protocol during bolus administration of intravenous contrast. Multiplanar CT image reconstructions and MIPs were obtained to evaluate the vascular anatomy. CONTRAST:  OMNIPAQUE IOHEXOL 350 MG/ML SOLN COMPARISON:  Radiograph same day FINDINGS: Cardiovascular: There is a optimal opacification of the pulmonary arteries. There is no central,segmental, or subsegmental filling defects within the pulmonary arteries. The heart is normal in size. No pericardial effusion or thickening. No evidence right heart strain. There is normal three-vessel brachiocephalic anatomy without proximal stenosis. The thoracic aorta is normal in appearance. Mediastinum/Nodes: No hilar, mediastinal, or axillary adenopathy. Thyroid gland, trachea, and esophagus demonstrate no significant findings. Lungs/Pleura: The lungs are clear. No pleural effusion or pneumothorax. No airspace consolidation. Upper Abdomen: No acute abnormalities present in the visualized portions of the upper abdomen. Percutaneous gastrotomy tube seen within the mid body of the stomach. Musculoskeletal: No chest wall abnormality. No acute or significant osseous findings. Review of the MIP images confirms the above findings. IMPRESSION: No central, segmental, or subsegmental pulmonary embolism. No other acute intrathoracic pathology to explain the patient's symptoms. Electronically Signed   By: Jonna Clark M.D.   On: 01/01/2020 21:33   DG Chest Portable 1 View  Result Date: 01/01/2020 CLINICAL DATA:  Increased agitation for 4 days, history of brain injury EXAM: PORTABLE CHEST 1  VIEW COMPARISON:  Portable exam 1550 hours compared to 12/28/2019 and 09/28/2019 FINDINGS: Normal heart size, mediastinal contours, and pulmonary vascularity. New somewhat focal but ill-defined opacity in the RIGHT mid lung question pneumonia versus  superimposed artifact. Remaining lungs clear. No pleural effusion or pneumothorax. Osseous structures unremarkable. IMPRESSION: New somewhat focal but ill-defined opacity in RIGHT mid lung question pneumonia; this was not seen on the prior study of 12/28/2019 nor on an earlier exam from 09/28/2019. Radiographic follow-up until resolution recommended to exclude pulmonary nodule. Electronically Signed   By: Ulyses Southward M.D.   On: 01/01/2020 16:05    Tyaira Heward Mariea Clonts, MD  [facility]  If 7PM-7AM, please contact night-coverage [email or web address] Password [facility] 01/26/2020, 9:42 AM   LOS: 25 days

## 2020-01-27 NOTE — Progress Notes (Signed)
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 He had elevated troponins over 5000.Due to combativeness, he required sedation and subsequent intubation [facility] with [facility]. He was started on ceftriaxone. Due to possible seizure, he was started on Keppra and placed on continuous EEG which was negative for seizure activity. 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 He received a 5 day course of SoluMedrol for cerebral edema. He then developed exteensor posturing with tachycardia and hypertension concerning for elevated intracranial pressure. He underwent Bolt ICP monitoring which demonstrated no elevated ICP. Follow up CT head and subsequent repeat MRI of brain showed small subacute hemorrhage in the right suboccipital lobe, either hemorrhagic conversion of small ischemic stroke vs extension of previously seen microhemorrhage, as well as hypoxic injury involving the bilateral caudate and putamen. He also sustained a NSTEMI. He was extubated on 10/10/2019 and underwent slow taper of Dilaudid to minimize withdrawal symptoms. A G-tube was inserted on 10/16/2019 and he was discharged on 10/18/2019 under the care of his mother who is a CNA.   He has been going to outpatient PT/OT and has been getting stronger. He is still taking food and medication via G-tube. He has not been able to establish care with a  PCP yet. He is easily angry and has kicked his mother. He does not talk much and often when he speaks, it may be a curse word. He is still incontinent. He requiredassistance with essential ADLs such as bathing, dressing and using toilet.  Assessment/Plan: Acuteon chronicmetabolic encephalopathy--- worsening behavioral problems superimposed on underlying anoxic brain injury -c/n Abilify 5 mg twice daily -Continue Lamictal 100 mg daily -Keppra was recently discontinued as patient's EEG was nonepileptiformand may have been contributing to increased agitation --Lorazepam or Haldol as needed agitation -Previously  discussed the risks/benefits of seroquel including its "black box warning" with patient's mother  -she expresses understanding in light of the patient's behavioral issues -She expresses that other medications and change of venue/scenery have been tried to try to provide care for the patient -in light of her inability to care for the patient and failure of other modalities, she accepts the risks with seroquel in hopes that his behavior can be controlled to allow for care of the patient -- -continuevalium bid -11/5 increased seroquel to 150 mg bid -11/6 overall less aggression but continues to have episodes of agitation -01/11/20--restraints off  -- 01/27/20 -overall now more re-directable and following commands; behaviors have improved significantly  after adjustment of medications -Patient remains medically stable, he is awaiting placement to appropriate facility as patient's mother who is a trained CNA is no longer able to manage him at home  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 He is still incontinent. He requires assistance with essential ADLs such as bathing, dressing and using toilet -Familydoes not feel that they can safely manage the patient at home and arerequesting placement to facility. -Social work consultation to help with placement requested  chronic anemia/iron deficiency anemia -multifactorial , partly nutritional, expect further drop in H&H with hydration/hemodilution -iron sat--9% -folate 7.1 -B12--2310 -c/n  iron and folate -Repeat CBC requested for 12/28/2019  Left elbow pain -Patient with ongoing weakness fall out of bed on 2020-01-14 -No swelling, skin breakdown or fractures appreciated on images. -No further significant left nipple pain  Disposition/Need for in-Hospital Stay- patient unable to be discharged at this time due to -behavioral problems requiring placement to appropriate facility --At this time patient does not have a safe discharge plan, -Awaiting placement to appropriate facility as patient's mother who is a trained CNA is no longer able to manage him at home  Status is: Inpatient  Remains inpatient appropriate because:behavioral problems-no safe discharge plan, awaiting transfer/placement to appropriate facility  Dispo: The patient is from:Home Anticipated d/c is to:SNF versus group home. Anticipated d/c date is:To be determined. Patient currently is overall medically stable to d/c--awaiting placement to appropriate facility   Family Communication:Mother updated   Consultants:none  Code Status: FULL   DVT Prophylaxis: [place]  Lovenox   Procedures: As Listed in Progress Note Above  Antibiotics: None   Subjective: - 01/27/20 -Patient remains medically stable for discharge --he is awaiting placement to appropriate facility as patient's mother who is a trained CNA is no longer able to manage him at home  --No new concerns -Continues to eat well  --Repeat labs requested for 01/28/2020  Objective: Vitals:   01/26/20 0324 01/26/20 1450 01/26/20 2251 01/27/20 0434  BP:  (!) 144/86 107/66 109/75  Pulse:  (!) 124 97 94  Resp:  20 18 20   Temp:  99.8 F (37.7 C) 98.2 F (36.8 C)   TempSrc:   Oral   SpO2:  100% 100% 100%  Weight: 88.1 kg   86.5 kg  Height:       No intake or output data in the 24 hours ending 01/27/20 1119 Weight change: -1.6 kg  Exam: General exam: Alert, awake, oriented x 1;   Respiratory system: Clear to auscultation. Respiratory effort normal.   Cardiovascular system:RRR. No murmurs, rubs, gallops.  No JVD. Gastrointestinal system: Abdomen is nondistended, soft and nontender.  . Normal bowel sounds heard. Central nervous system: No additional new focal neurological deficits. Extremities: No cyanosis or clubbing. Skin: No petechiae. Psychiatry: Behaviors have improved,; for the most part able to follow commands currently.   Data Reviewed: I have personally reviewed following labs and imaging studies  Urine analysis:    Component Value Date/Time   COLORURINE YELLOW 01/01/2020 2133   APPEARANCEUR HAZY (A) 01/01/2020 2133   LABSPEC 1.003 (L) 01/01/2020 2133   PHURINE 6.0 01/01/2020 2133   GLUCOSEU NEGATIVE 01/01/2020 2133   HGBUR LARGE (A) 01/01/2020 2133   BILIRUBINUR NEGATIVE 01/01/2020 2133   KETONESUR 20 (A) 01/01/2020 2133   PROTEINUR 30 (A) 01/01/2020 2133   NITRITE NEGATIVE 01/01/2020 2133   LEUKOCYTESUR LARGE (A) 01/01/2020 2133   Scheduled Meds: . ARIPiprazole  5 mg Oral BID  .  diazepam  5 mg Oral Q12H  . enoxaparin (LOVENOX) injection  40 mg  Subcutaneous Q24H  . feeding supplement  237 mL Oral BID BM  . ferrous sulfate  325 mg Oral BID WC  . folic acid  1 mg Oral Daily  . lamoTRIgine  100 mg Oral Daily  . metoprolol tartrate  12.5 mg Oral BID  . polyethylene glycol  17 g Oral Daily  . QUEtiapine  150 mg Per Tube BID  . senna-docusate  2 tablet Oral BID   Continuous Infusions:   Procedures/Studies: DG Elbow 2 Views Left  Result Date: 01/14/2020 CLINICAL DATA:  Pain status post fall EXAM: LEFT ELBOW - 2 VIEW COMPARISON:  None. FINDINGS: There is no evidence of fracture, dislocation, or joint effusion. There is no evidence of arthropathy or other focal bone abnormality. Soft tissues are unremarkable. IMPRESSION: Negative. Electronically Signed   By: Katherine Mantle M.D.   On: 01/14/2020 19:31   CT Head Wo Contrast  Result Date: 01/01/2020 CLINICAL DATA:  Altered mental status. EXAM: CT HEAD WITHOUT CONTRAST TECHNIQUE: Contiguous axial images were obtained from the base of the skull through the vertex without intravenous contrast. COMPARISON:  December 28, 2019 FINDINGS: Brain: There is mild cerebral atrophy with widening of the extra-axial spaces and ventricular dilatation. There are areas of decreased attenuation within the white matter tracts of the supratentorial brain, consistent with microvascular disease changes. Vascular: No hyperdense vessel or unexpected calcification. Skull: Normal. Negative for fracture or focal lesion. Sinuses/Orbits: A 1.2 cm x 1.1 cm left maxillary sinus polyp versus mucous retention cyst is seen. Other: None. IMPRESSION: 1. Generalized cerebral atrophy. 2. No acute intracranial abnormality. 3. Left maxillary sinus polyp versus mucous retention cyst. Electronically Signed   By: Aram Candela M.D.   On: 01/01/2020 21:33   CT Angio Chest PE W/Cm &/Or Wo Cm  Result Date: 01/01/2020 CLINICAL DATA:  Abnormal chest x-ray and tachycardia EXAM: CT ANGIOGRAPHY CHEST WITH CONTRAST TECHNIQUE:  Multidetector CT imaging of the chest was performed using the standard protocol during bolus administration of intravenous contrast. Multiplanar CT image reconstructions and MIPs were obtained to evaluate the vascular anatomy. CONTRAST:  OMNIPAQUE IOHEXOL 350 MG/ML SOLN COMPARISON:  Radiograph same day FINDINGS: Cardiovascular: There is a optimal opacification of the pulmonary arteries. There is no central,segmental, or subsegmental filling defects within the pulmonary arteries. The heart is normal in size. No pericardial effusion or thickening. No evidence right heart strain. There is normal three-vessel brachiocephalic anatomy without proximal stenosis. The thoracic aorta is normal in appearance. Mediastinum/Nodes: No hilar, mediastinal, or axillary adenopathy. Thyroid gland, trachea, and esophagus demonstrate no significant findings. Lungs/Pleura: The lungs are clear. No pleural effusion or pneumothorax. No airspace consolidation. Upper Abdomen: No acute abnormalities present in the visualized portions of the upper abdomen. Percutaneous gastrotomy tube seen within the mid body of the stomach. Musculoskeletal: No chest wall abnormality. No acute or significant osseous findings. Review of the MIP images confirms the above findings. IMPRESSION: No central, segmental, or subsegmental pulmonary embolism. No other acute intrathoracic pathology to explain the patient's symptoms. Electronically Signed   By: Jonna Clark M.D.   On: 01/01/2020 21:33   DG Chest Portable 1 View  Result Date: 01/01/2020 CLINICAL DATA:  Increased agitation for 4 days, history of brain injury EXAM: PORTABLE CHEST 1 VIEW COMPARISON:  Portable exam 1550 hours compared to 12/28/2019 and 09/28/2019 FINDINGS: Normal heart size, mediastinal contours, and pulmonary vascularity. New somewhat focal but ill-defined opacity in  the RIGHT mid lung question pneumonia versus superimposed artifact. Remaining lungs clear. No pleural effusion or  pneumothorax. Osseous structures unremarkable. IMPRESSION: New somewhat focal but ill-defined opacity in RIGHT mid lung question pneumonia; this was not seen on the prior study of 12/28/2019 nor on an earlier exam from 09/28/2019. Radiographic follow-up until resolution recommended to exclude pulmonary nodule. Electronically Signed   By: Ulyses Southward M.D.   On: 01/01/2020 16:05    Simora Dingee Mariea Clonts, MD  [facility]  If 7PM-7AM, please contact night-coverage [email or web address] Password [email or web address] 01/27/2020, 11:19 AM   LOS: 26 days

## 2020-01-27 NOTE — TOC Progression Note (Signed)
Transition of Care [facility]) - Progression Note    Patient Details  Name: Mekhi Sonn MRN: [id number] Date of Birth: 08-14-1980  [facility] of [facility] CM/SW Contact  Elliot Gault, LCSW Phone Number: 01/27/2020, 11:59 AM  Clinical Narrative:     TOC following. TOC supervisor continuing to negotiate with potential SNF options for placement. MD updated. TOC will follow.    Barriers to Discharge: No SNF bed  Expected Discharge Plan and Services           Expected Discharge Date: 01/26/20                                     Social Determinants of Health (SDOH) Interventions    Readmission Risk Interventions No flowsheet data found.

## 2020-01-28 MED ORDER — HALOPERIDOL 5 MG PO TABS
5.0000 mg | ORAL_TABLET | Freq: Four times a day (QID) | ORAL | Status: DC | PRN
  Filled 2020-01-28: qty 1

## 2020-01-28 NOTE — Progress Notes (Signed)
Nutrition Follow-up  DOCUMENTATION CODES:   Not applicable  INTERVENTION:  Continue Ensure Enlive po BID, each supplement provides 350 kcal and 20 grams of protein  Continue Magic cup BID with meals, each supplement provides 290 kcal and 9 grams of protein  Encouraged po intake of meals/supplements   NUTRITION DIAGNOSIS:   Inadequate oral intake related to inability to eat as evidenced by  (PEG tube dependent for nutrition/hydration and medications s/p multiple subacute strokes with residual weakness secondary to recent bacterial meningitis). -resolved  GOAL:   Patient will meet greater than or equal to 90% of their needs -meeting  MONITOR:   PO intake, Weight trends, Labs, Diet advancement, I & O's  REASON FOR ASSESSMENT:   Rounds    ASSESSMENT:   39 year old male with history of recent hospitalization for bacterial meningitis (7/24-8/14). 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 Pt presented with progressively worsening mental status, increased agitation and family reports unable to safely manage him at home.  10/28-admit 11/15- PEG removed  Pt sleepy, but awake laying in bed this morning, mother present in room. Reports doing alright today,  endorses good intake of meals and drinking supplements. Meal intake 50-100% (88% average x last 8 documented meals 11/18-11/24). He is looking forward to having [place] tomorrow. RD encouraged pt to continue eating meals and drinking supplements.   Weights stable since last assessment and +7.26 lbs since admit.  Medications reviewed and include: Abilify, Valium, Ferrous sulfate, Miralax, Senokot  No new labs for review  Diet Order:   Diet Order            Diet regular Room service appropriate? Yes; Fluid consistency: Thin  Diet effective now                 EDUCATION NEEDS:    Education needs have been addressed  Skin:  Skin Assessment: Skin Integrity Issues: Skin Integrity Issues:: Other (Comment) Other: MASD;buttocks;left  Last BM:  11/24-type 4  Height:   Ht Readings from Last 1 Encounters:  01/02/20 6\' 2"  (1.88 m)    Weight:   Wt Readings from Last 1 Encounters:  01/27/20 86.5 kg    BMI:  Body mass index is 24.48 kg/m.  Estimated Nutritional Needs:   Kcal:  2080-2330  Protein:  105-115  Fluid:  >/= 2 L/day   06-10-1970, RD, LDN Clinical Nutrition After Hours/Weekend Pager # in Amion

## 2020-01-28 NOTE — Progress Notes (Signed)
Pt has slept since IM Haldol given. Pt awakened and allowed me to get his blood pressure. Offered pt his supper tray, pt states, "Na, not right now." Pt has voided incontinently in bed but will not allow me to clean him at this time.

## 2020-01-28 NOTE — Progress Notes (Signed)
1500: Heard pt yelling and cursing from hallway. In to room to find another staff member at bedside, pt sitting on side of, swinging arms and yelling, "Why you do that man? I ain't done nothing to you!" while crying and moaning. Was advised by other staff member that PT had been in to work with pt and pt became upset. Attempted to calm pt with quiet words, reassurance and light touch. Pt will hold my hand then yell at window or wall and begin swinging fists saying, "Get the hell away from me man! What did I ever do to you?". Pt not looking at staff when yelling, appears to be having hallucinations. Will then look at staff, asks to hold our hands and asks, "I try to be good. I aint' done nothing to yall. I thought yall was my dawgs!"  Pt did state that his feet and back were hurting, Tylenol 650mg  po given crushed in ice cream. Pt ate all of ice cream and was offered ensure, initially took cup then began yelling and threw cup at wall. Another staff member in to room and pt responded very well to her, calmed down and took another cup of ensure. Pt continues to cry and yell intermittently, alternately sitting and lying down on bed. Still very restless and agitated. Administered Haldol 5mg  IM in left deltoid for agitation. Dr. in to evaluate pt as well.

## 2020-01-28 NOTE — Progress Notes (Signed)
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  [facility]resented to the [facility] ED on 09/28/2019 with altered mental status and fever of 104 F, WBC 17.4 and lactate 4.7 after a day of generalized malaise, nausea, vomiting, weaknessand headache. He had elevated troponins over 5000.Due to combativeness, he required sedation and subsequent intubation [facility] and [facility] with pneumococcal [facility]itis. He was started on ceftriaxone. Due to possible seizure, he was started on Keppra and placed on continuous EEG which was negative for seizure activity. 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 He received a 5 day course of SoluMedrol for cerebral edema. He then developed exteensor posturing with tachycardia and hypertension concerning for elevated intracranial pressure. He underwent Bolt ICP monitoring which demonstrated no elevated ICP. Follow up CT head and subsequent repeat MRI of brain showed small subacute hemorrhage in the right suboccipital lobe, either hemorrhagic conversion of small ischemic stroke vs extension of previously seen microhemorrhage, as well as hypoxic injury involving the bilateral caudate and putamen. He also sustained a NSTEMI. He was extubated on 10/10/2019 and underwent slow taper of Dilaudid to minimize withdrawal symptoms. A G-tube was inserted on 10/16/2019 and he was discharged on 10/18/2019 under the care of his mother who is a CNA.   He was brought to hospital with altered mental status.   Subjective   Patient seen and examined, continues to have behavioral issues.   Assessment/Plan:     1. Acute on chronic  metabolic encephalopathy-patient having worsening behavioral problems superimposed on underlying anoxic brain injury.  Continue Abilify 5 mg twice daily, Lamictal 100 mg daily.  Keppra was recently discontinued as patient's EEG was unremarkable.  Continue Ativan, Haldol for agitation as needed.  Patient is also on Seroquel 150 mg twice daily.  He was off restraints on 01/11/2020. 2. 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  Medications adjusted as above. 3. Dysphagia/FEN-seen by speech therapy, continue regular diet.  PEG tube removed on 01/19/2020.  Continue ongoing speech therapy. 4. SVT/hypertension-patient had intermittent episodes of SVT, controlled and resolved with use of metoprolol. 5. Social issues-patient's mother is trained CNA and is no longer able to manage him at home.  Awaiting placement to facility.     COVID-19 Labs  No results for input(s): DDIMER, FERRITIN, LDH, CRP in the last 72 hours.  Lab Results  Component Value Date   SARSCOV2NAA NEGATIVE 01/01/2020     Scheduled medications:   . ARIPiprazole  5 mg Oral BID  . diazepam  5 mg Oral Q12H  . enoxaparin (LOVENOX) injection  40 mg Subcutaneous Q24H  . feeding supplement  237 mL Oral BID BM  . ferrous sulfate  325 mg Oral BID WC  . folic acid  1 mg Oral Daily  . lamoTRIgine  100 mg Oral Daily  . metoprolol tartrate  12.5 mg Oral BID  . polyethylene glycol  17 g Oral Daily  . QUEtiapine  150 mg Per Tube BID  . senna-docusate  2 tablet Oral BID         CBG: No results for input(s): GLUCAP in the last 168 hours.  SpO2: 99 %    CBC: No results for  input(s): WBC, NEUTROABS, HGB, HCT, MCV, PLT in the last 168 hours.  Basic Metabolic Panel: No results for input(s): NA, K, CL, CO2, GLUCOSE, BUN, CREATININE, CALCIUM, MG, PHOS in the last 168 hours.   Liver Function Tests: No results for input(s): AST, ALT, ALKPHOS,  BILITOT, PROT, ALBUMIN in the last 168 hours.   Antibiotics: Anti-infectives (From admission, onward)   Start     Dose/Rate Route Frequency Ordered Stop   01/07/20 1800  cefTRIAXone (ROCEPHIN) 1 g in sodium chloride 0.9 % 100 mL IVPB  Status:  Discontinued        1 g 200 mL/hr over 30 Minutes Intravenous Every 24 hours 01/07/20 1710 01/07/20 1714   01/07/20 1800  cefdinir (OMNICEF) capsule 300 mg  Status:  Discontinued        300 mg Oral Every 12 hours 01/07/20 1714 01/14/20 0752   01/02/20 2200  cefTRIAXone (ROCEPHIN) 1 g in sodium chloride 0.9 % 100 mL IVPB  Status:  Discontinued        1 g 200 mL/hr over 30 Minutes Intravenous Every 24 hours 01/01/20 2228 01/06/20 0854   01/01/20 2200  cefTRIAXone (ROCEPHIN) 1 g in sodium chloride 0.9 % 100 mL IVPB        1 g 200 mL/hr over 30 Minutes Intravenous  Once 01/01/20 2149 01/02/20 0026       DVT prophylaxis: Lovenox  Code Status: Full code  Family Communication: No family at bedside   Consultants: None  Procedures:      Objective   Vitals:   01/27/20 1545 01/27/20 2247 01/28/20 0602 01/28/20 0931  BP:  116/78 (!) 146/86 118/78  Pulse: (!) 101 (!) 107 (!) 110 97  Resp: 16 18 18    Temp:      TempSrc:      SpO2: 100% 100% 99%   Weight:      Height:        Intake/Output Summary (Last 24 hours) at 01/28/2020 1710 Last data filed at 01/28/2020 0856 Gross per 24 hour  Intake 845 ml  Output --  Net 845 ml    No intake/output data recorded.  Filed Weights   01/24/20 0308 01/26/20 0324 01/27/20 0434  Weight: 87.9 kg 88.1 kg 86.5 kg    Physical Examination:    General: Appears in no acute distress  Cardiovascular: S1-S2, regular, no murmur auscultated  Respiratory: Clear to auscultation bilaterally, no wheeze no crackles auscultated  Abdomen: Abdomen is soft, nontender, no organomegaly  Extremities: No edema in the lower extremities  Neurologic: Alert, oriented to self only   Status is:  Inpatient  Dispo: The patient is from: Home              Anticipated d/c is to: Skilled nursing facility              Anticipated d/c date is: 02/02/2020              Patient currently medically stable for discharge  Barrier to discharge- awaiting bed at skilled nursing facility, hard to find appropriate facility due to behavioral issues.      02/04/2020   [facility] If 7PM-7AM, please contact night-coverage at [email or web address], Office  [phone]   01/28/2020, 5:10 PM  LOS: 27 days

## 2020-01-28 NOTE — Progress Notes (Signed)
Physical Therapy Treatment Patient Details Name: Isaac [name] MRN: [id number] DOB: 1980/08/12 Today's Date: 01/28/2020    History of Present Illness Isaac [name] is a 39 y.o. male with medical history significant of history of bacterial meningitis about 3 months ago, NSTEMI due to demand ischemia during his meningitis hospitalization (see EDP note for attached discharge summary) who is brought to the emergency department by family members due to the patient's worsening mental status.  The patient's mental status has been progressively worse.  He was seen at [facility] about 4 days ago and given Geodon due to restlessness, but subsequently discharged home.  His mother has been having a very difficult time taking care of him at home.  Neurology has been trying to assist in long-term placement, but the patient is uninsured and his mother is unable to afford it.  Neurology recently discontinue Keppra and started on Lamictal to control his agitation.  He has also been given Seroquel without significant results.    PT Comments    Patient requires Max verbal/tactile cueing to participate with therapy, easily agitated requiring frequent redirection to tasks with fair/poor carryover.  Patient able to complete sit to stands x 4 trials with Mod/max 2 person hand held assist, but unable to fully extend knees or take steps due to weakness and/or agitation.  Patient required 2 person assist to reposition when put back to bed.  Patient will benefit from continued physical therapy in hospital and recommended venue below to increase strength, balance, endurance for safe ADLs and gait.   Follow Up Recommendations  SNF     Equipment Recommendations  Rolling walker with 5" wheels    Recommendations for Other Services       Precautions / Restrictions Precautions Precautions: Fall Restrictions Weight Bearing Restrictions: No    Mobility  Bed Mobility Overal bed mobility: Needs Assistance Bed  Mobility: Sit to Supine;Supine to Sit     Supine to sit: Mod assist Sit to supine: Mod assist   General bed mobility comments: slow labored movement with frequent verbal/tactile cueing to follow directions  Transfers Overall transfer level: Needs assistance Equipment used: 2 person hand held assist Transfers: Sit to/from Stand           General transfer comment: very unsteady on feet with difficulty extending trunk due to weakness  Ambulation/Gait                 Stairs             Wheelchair Mobility    Modified Rankin (Stroke Patients Only)       Balance Overall balance assessment: Needs assistance Sitting-balance support: Feet supported;No upper extremity supported Sitting balance-Leahy Scale: Fair Sitting balance - Comments: fair/good seated at EOB   Standing balance support: During functional activity;Bilateral upper extremity supported Standing balance-Leahy Scale: Poor Standing balance comment: fair/poor with 2 person hand held assist                            Cognition Arousal/Alertness: Awake/alert Behavior During Therapy: Impulsive;Restless Overall Cognitive Status: History of cognitive impairments - at baseline                 [place] Levels of Cognitive Functioning [place] [address] Scales of Cognitive Functioning: Confused/agitated                      Exercises      General Comments  Pertinent Vitals/Pain Pain Assessment: Faces Pain Location: BLE with pressure Pain Descriptors / Indicators: Grimacing;Guarding Pain Intervention(s): Limited activity within patient's tolerance;Monitored during session    Home Living                      Prior Function            PT Goals (current goals can now be found in the care plan section) Acute Rehab PT Goals Patient Stated Goal: not stated PT Goal Formulation: With patient Time For Goal Achievement: 02/11/20 Potential to Achieve Goals:  Fair Progress towards PT goals: Progressing toward goals    Frequency    Min 2X/week      PT Plan Current plan remains appropriate    Co-evaluation PT/OT/SLP Co-Evaluation/Treatment: Yes            AM-PAC PT "6 Clicks" Mobility   Outcome Measure  Help needed turning from your back to your side while in a flat bed without using bedrails?: A Little Help needed moving from lying on your back to sitting on the side of a flat bed without using bedrails?: A Lot Help needed moving to and from a bed to a chair (including a wheelchair)?: A Lot Help needed standing up from a chair using your arms (e.g., wheelchair or bedside chair)?: A Lot Help needed to walk in hospital room?: A Lot Help needed climbing 3-5 steps with a railing? : Total 6 Click Score: 12    End of Session   Activity Tolerance: Patient tolerated treatment well;Patient limited by fatigue;Treatment limited secondary to agitation Patient left: in bed;with call bell/phone within reach;with bed alarm set Nurse Communication: Mobility status PT Visit Diagnosis: Unsteadiness on feet (R26.81);Other abnormalities of gait and mobility (R26.89);Muscle weakness (generalized) (M62.81)     Time: 9357-0177 PT Time Calculation (min) (ACUTE ONLY): 31 min  Charges:  $Therapeutic Activity: 8-22 mins                     3:36 PM, 01/28/20 Ocie Bob, MPT Physical Therapist with [facility] [phone] office [phone] mobile phone

## 2020-01-29 NOTE — Progress Notes (Signed)
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  Hepresented to the [facility] ED on 09/28/2019 with altered mental status and fever of 104 F, WBC 17.4 and lactate 4.7 after a day of generalized malaise, nausea, vomiting, weaknessand headache. He had elevated troponins over 5000.Due to combativeness, he required sedation and subsequent intubation and [facility] and diagnosed with pneumococcal meningitis. He was started on ceftriaxone. Due to possible seizure, he was started on Keppra and placed on continuous EEG which was negative for seizure activity. 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 He received a 5 day course of SoluMedrol for cerebral edema. He then developed exteensor posturing with tachycardia and hypertension concerning for elevated intracranial pressure. He underwent Bolt ICP monitoring which demonstrated no elevated ICP. Follow up CT head and subsequent repeat MRI of brain showed small subacute hemorrhage in the right suboccipital lobe, either hemorrhagic conversion of small ischemic stroke vs extension of previously seen microhemorrhage, as well as hypoxic injury involving the bilateral caudate and putamen. He also sustained a NSTEMI. He was extubated on 10/10/2019 and underwent slow taper of Dilaudid to minimize withdrawal symptoms. A G-tube was inserted on 10/16/2019 and he was discharged on 10/18/2019 under the care of his mother who is a CNA.   He was brought to hospital with altered mental status.   Subjective   Patient seen and examined, denies any complaints.  Required Haldol yesterday for agitation.   Assessment/Plan:      1. 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  Continue Abilify 5 mg twice daily, Lamictal 100 mg daily.  Keppra was recently discontinued as patient's EEG was unremarkable.  Continue Ativan, Haldol for agitation as needed.  Patient is also on Seroquel 150 mg twice daily.  He was off restraints on 01/11/2020. 2. 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  Medications adjusted as above. 3. Dysphagia/FEN-seen by speech therapy, continue regular diet.  PEG tube removed on 01/19/2020.  Continue ongoing speech therapy. 4. SVT/hypertension-patient had intermittent episodes of SVT, controlled and resolved with use of metoprolol. 5. Social issues-patient's mother is trained CNA and is no longer able to manage him at home.  Awaiting placement to facility.     COVID-19 Labs  No results for input(s): DDIMER, FERRITIN, LDH, CRP in the last 72 hours.  Lab Results  Component Value Date   SARSCOV2NAA NEGATIVE 01/01/2020     Scheduled medications:   . ARIPiprazole  5 mg Oral BID  . diazepam  5 mg Oral Q12H  . enoxaparin (LOVENOX) injection  40 mg Subcutaneous Q24H  . feeding supplement  237 mL Oral BID BM  . ferrous sulfate  325 mg Oral BID WC  . folic acid  1 mg Oral Daily  . lamoTRIgine  100 mg Oral Daily  . metoprolol tartrate  12.5 mg Oral BID  . polyethylene glycol  17 g Oral Daily  . QUEtiapine  150 mg Per Tube BID  . senna-docusate  2 tablet Oral BID           Antibiotics: Anti-infectives (From admission, onward)   Start     Dose/Rate Route Frequency Ordered  Stop   01/07/20 1800  cefTRIAXone (ROCEPHIN) 1 g in sodium chloride 0.9 % 100 mL IVPB  Status:  Discontinued        1 g 200 mL/hr over 30 Minutes Intravenous Every 24 hours 01/07/20 1710 01/07/20 1714   01/07/20 1800  cefdinir (OMNICEF) capsule  300 mg  Status:  Discontinued        300 mg Oral Every 12 hours 01/07/20 1714 01/14/20 0752   01/02/20 2200  cefTRIAXone (ROCEPHIN) 1 g in sodium chloride 0.9 % 100 mL IVPB  Status:  Discontinued        1 g 200 mL/hr over 30 Minutes Intravenous Every 24 hours 01/01/20 2228 01/06/20 0854   01/01/20 2200  cefTRIAXone (ROCEPHIN) 1 g in sodium chloride 0.9 % 100 mL IVPB        1 g 200 mL/hr over 30 Minutes Intravenous  Once 01/01/20 2149 01/02/20 0026       DVT prophylaxis: Lovenox  Code Status: Full code  Family Communication: No family at bedside   Consultants: None  Procedures:      Objective   Vitals:   01/28/20 0931 01/28/20 1830 01/28/20 1949 01/29/20 0500  BP: 118/78 107/61 129/83   Pulse: 97 96 86   Resp:  16 19   Temp:   98.3 F (36.8 C)   TempSrc:      SpO2:  100% 100%   Weight:    86.6 kg  Height:       No intake or output data in the 24 hours ending 01/29/20 1228  11/23 1901 - 11/25 0700 In: 845 [P.O.:845] Out: -   Filed Weights   01/26/20 0324 01/27/20 0434 01/29/20 0500  Weight: 88.1 kg 86.5 kg 86.6 kg    Physical Examination:    General-appears in no acute distress  Heart-S1-S2, regular, no murmur auscultated  Lungs-clear to auscultation bilaterally, no wheezing or crackles auscultated  Abdomen-soft, nontender, no organomegaly  Extremities-no edema in the lower extremities  Neuro-alert, oriented to self only   Status is: Inpatient  Dispo: The patient is from: Home              Anticipated d/c is to: Skilled nursing facility              Anticipated d/c date is: 02/02/2020              Patient currently medically stable for discharge  Barrier to discharge- awaiting bed at skilled nursing facility, hard to find appropriate facility due to behavioral issues.      [name]   [facility] If 7PM-7AM, please contact night-coverage at [email or web address], Office  [phone]   01/29/2020, 12:28 PM  LOS: 28 days

## 2020-01-30 NOTE — Progress Notes (Signed)
SLP Cancellation Note  Patient Details Name: Isaac [name] MRN: [id number] DOB: 1980/06/17   Cancelled treatment:       Reason Eval/Treat Not Completed: Patient's level of consciousness. Pt was very lethargic today and despite 3 attempts this am (attempted after PT session this am), Pt did not rouse to an appropriate level for skilled ST- Pt would open his eyes and respond with "yeah" when asked a question and would then immediately close his eyes again. ST will continue efforts. Thank you,  Kapil Petropoulos H. Romie Levee, CCC-SLP Speech Language Pathologist    Georgetta Haber 01/30/2020, 1:28 PM

## 2020-01-30 NOTE — Progress Notes (Signed)
Physical Therapy Treatment Patient Details Name: Isaac [name] MRN: [id number] DOB: 30-Nov-1980 Today's Date: 01/30/2020    History of Present Illness Isaac [name] is a 39 y.o. male with medical history significant of history of bacterial meningitis about 3 months ago, NSTEMI due to demand ischemia during his meningitis hospitalization (see EDP note for attached discharge summary) who is brought to the emergency department by family members due to the patient's worsening mental status.  The patient's mental status has been progressively worse.  He was seen at [facility] about 4 days ago and given Geodon due to restlessness, but subsequently discharged home.  His mother has been having a very difficult time taking care of him at home.  Neurology has been trying to assist in long-term placement, but the patient is uninsured and his mother is unable to afford it.  Neurology recently discontinue Keppra and started on Lamictal to control his agitation.  He has also been given Seroquel without significant results.    PT Comments    Patient approached for therapy today and is agreeable and easy to re-direct to task with verbal/tactile cues.  Patient tolerates increased activity today and able to perform transfers with min-mod assistance and performn ambulation for short distance on level surfaces with RW and tactile cues for upright posture and proximity of AD.  Patient's impulsive behaviors necessitate a wheelchair to follow and cues for attention to task during mobility.  Improved activity tolerance noted today.  Patient will benefit from continued physical therapy in hospital and recommended venue below to increase strength, balance, endurance for safe ADLs and gait.    Follow Up Recommendations  SNF     Equipment Recommendations  Rolling walker with 5" wheels    Recommendations for Other Services       Precautions / Restrictions Precautions Precautions: Fall Restrictions Weight Bearing  Restrictions: No    Mobility  Bed Mobility                  Transfers Overall transfer level: Needs assistance Equipment used: Rolling walker (2 wheeled) Transfers: Sit to/from Stand Sit to Stand: Mod assist         General transfer comment: improved spontaneous use of RW noted, decrease in cues needed to sequence  Ambulation/Gait Ambulation/Gait assistance: Mod assist;Max assist Gait Distance (Feet): 10 Feet Assistive device: Rolling walker (2 wheeled);2 person hand held assist Gait Pattern/deviations: Decreased step length - right;Decreased step length - left;Decreased stride length     General Gait Details: poor coordination w/ some retrograde extension noted   Stairs             Wheelchair Mobility    Modified Rankin (Stroke Patients Only)       Balance Overall balance assessment: Needs assistance Sitting-balance support: Feet supported;No upper extremity supported Sitting balance-Leahy Scale: Fair     Standing balance support: During functional activity;Bilateral upper extremity supported Standing balance-Leahy Scale: Fair Standing balance comment: able to tolerate longer period of 10-20 sec                            Cognition Arousal/Alertness: Awake/alert Behavior During Therapy: Impulsive;Restless Overall Cognitive Status: History of cognitive impairments - at baseline                 [place] Levels of Cognitive Functioning [place] [address] Scales of Cognitive Functioning: Confused/agitated  Exercises      General Comments        Pertinent Vitals/Pain Pain Assessment: No/denies pain Faces Pain Scale: No hurt    Home Living                      Prior Function            PT Goals (current goals can now be found in the care plan section) Acute Rehab PT Goals Time For Goal Achievement: 02/11/20 Potential to Achieve Goals: Fair Progress towards PT goals: Progressing  toward goals    Frequency    Min 2X/week      PT Plan Current plan remains appropriate    Co-evaluation              AM-PAC PT "6 Clicks" Mobility   Outcome Measure                   End of Session Equipment Utilized During Treatment: Gait belt Activity Tolerance: Patient tolerated treatment well;Patient limited by fatigue;Treatment limited secondary to agitation Patient left: in chair;with chair alarm set;with call bell/phone within reach Nurse Communication: Mobility status PT Visit Diagnosis: Unsteadiness on feet (R26.81);Other abnormalities of gait and mobility (R26.89);Muscle weakness (generalized) (M62.81)     Time: 1000-1026 PT Time Calculation (min) (ACUTE ONLY): 26 min  Charges:  $Gait Training: 8-22 mins $Therapeutic Activity: 8-22 mins                     10:54 AM, 01/30/20 M. Shary Decamp, PT, DPT Physical Therapist- [place] Office Number: [phone]

## 2020-01-30 NOTE — Plan of Care (Signed)

## 2020-01-30 NOTE — Progress Notes (Signed)
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  Hepresented to the [facility] ED on 09/28/2019 with altered mental status and fever of 104 F, WBC 17.4 and lactate 4.7 after a day of generalized malaise, nausea, vomiting, weaknessand headache. He had elevated troponins over 5000.Due to combativeness, he required sedation and subsequent intubation and [facility] and diagnosed with pneumococcal meningitis. He was started on ceftriaxone. Due to possible seizure, he was started on Keppra and placed on continuous EEG which was negative for seizure activity. 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 He received a 5 day course of SoluMedrol for cerebral edema. He then developed exteensor posturing with tachycardia and hypertension concerning for elevated intracranial pressure. He underwent Bolt ICP monitoring which demonstrated no elevated ICP. Follow up CT head and subsequent repeat MRI of brain showed small subacute hemorrhage in the right suboccipital lobe, either hemorrhagic conversion of small ischemic stroke vs extension of previously seen microhemorrhage, as well as hypoxic injury involving the bilateral caudate and putamen. He also sustained a NSTEMI. He was extubated on 10/10/2019 and underwent slow taper of Dilaudid to minimize withdrawal symptoms. A G-tube was inserted on 10/16/2019 and he was discharged on 10/18/2019 under the care of his mother who is a CNA.   He was brought to hospital with altered mental status.   Subjective   Patient seen and examined, no new complaints.   Assessment/Plan:     1. 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  Continue Abilify 5 mg twice daily, Lamictal 100 mg daily.  Keppra was recently discontinued as patient's EEG was unremarkable.  Continue Ativan, Haldol for agitation as needed.  Patient is also on Seroquel 150 mg twice daily.  He was off restraints on 01/11/2020. 2. 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  Medications adjusted as above. 3. Dysphagia/FEN-seen by speech therapy, continue regular diet.  PEG tube removed on 01/19/2020.  Continue ongoing speech therapy. 4. SVT/hypertension-patient had intermittent episodes of SVT, controlled and resolved with use of metoprolol. 5. Social issues-patient's mother is trained CNA and is no longer able to manage him at home.  Awaiting placement to facility.     COVID-19 Labs  No results for input(s): DDIMER, FERRITIN, LDH, CRP in the last 72 hours.  Lab Results  Component Value Date   SARSCOV2NAA NEGATIVE 01/01/2020     Scheduled medications:   . ARIPiprazole  5 mg Oral BID  . diazepam  5 mg Oral Q12H  . enoxaparin (LOVENOX) injection  40 mg Subcutaneous Q24H  . feeding supplement  237 mL Oral BID BM  . ferrous sulfate  325 mg Oral BID WC  . folic acid  1 mg Oral Daily  . lamoTRIgine  100 mg Oral Daily  . metoprolol tartrate  12.5 mg Oral BID  . polyethylene glycol  17 g Oral Daily  . QUEtiapine  150 mg Per Tube BID  . senna-docusate  2 tablet Oral BID           Antibiotics: Anti-infectives (From admission, onward)   Start     Dose/Rate Route Frequency Ordered Stop   01/07/20 1800  cefTRIAXone (ROCEPHIN) 1 g in sodium chloride 0.9 % 100 mL IVPB  Status:  Discontinued        1 g 200 mL/hr over 30 Minutes Intravenous Every 24 hours 01/07/20 1710 01/07/20 1714   01/07/20 1800  cefdinir (OMNICEF) capsule 300 mg  Status:  Discontinued         300 mg Oral Every 12 hours 01/07/20 1714 01/14/20 0752   01/02/20 2200  cefTRIAXone (ROCEPHIN) 1 g in sodium chloride 0.9 % 100 mL IVPB  Status:  Discontinued        1 g 200 mL/hr over 30 Minutes Intravenous Every 24 hours 01/01/20 2228 01/06/20 0854   01/01/20 2200  cefTRIAXone (ROCEPHIN) 1 g in sodium chloride 0.9 % 100 mL IVPB        1 g 200 mL/hr over 30 Minutes Intravenous  Once 01/01/20 2149 01/02/20 0026       DVT prophylaxis: Lovenox  Code Status: Full code  Family Communication: No family at bedside   Consultants: None  Procedures:      Objective   Vitals:   01/29/20 0500 01/29/20 2038 01/30/20 0601 01/30/20 1401  BP:   113/77 121/87  Pulse:   93 100  Resp:   20 17  Temp:   (!) 97.5 F (36.4 C) 97.8 F (36.6 C)  TempSrc:   Oral Oral  SpO2:  96% 100% 100%  Weight: 86.6 kg     Height:        Intake/Output Summary (Last 24 hours) at 01/30/2020 1429 Last data filed at 01/30/2020 1027 Gross per 24 hour  Intake 1080 ml  Output --  Net 1080 ml    11/24 1901 - 11/26 0700 In: 1920 [P.O.:1920] Out: -   Filed Weights   01/26/20 0324 01/27/20 0434 01/29/20 0500  Weight: 88.1 kg 86.5 kg 86.6 kg    Physical Examination:   General-appears in no acute distress Heart-S1-S2, regular, no murmur auscultated Lungs-clear to auscultation bilaterally, no wheezing or crackles auscultated Abdomen-soft, nontender, no organomegaly Extremities-no edema in the lower extremities Neuro-alert, oriented to self only  Status is: Inpatient  Dispo: The patient is from: Home              Anticipated d/c is to: Skilled nursing facility              Anticipated d/c date is: 02/02/2020              Patient currently medically stable for discharge  Barrier to discharge- awaiting bed at skilled nursing facility, hard to find appropriate facility due to behavioral issues.      [name]   [facility] If 7PM-7AM, please contact night-coverage at  [email or web address], Office  [phone]   01/30/2020, 2:29 PM  LOS: 29 days

## 2020-01-30 NOTE — Plan of Care (Signed)

## 2020-01-31 NOTE — Progress Notes (Signed)
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  Hepresented to the [facility] ED on 09/28/2019 with altered mental status and fever of 104 F, WBC 17.4 and lactate 4.7 after a day of generalized malaise, nausea, vomiting, weaknessand headache. He had elevated troponins over 5000.Due to combativeness, he required sedation and subsequent intubation [facility] and diagnosed with pneumococcal meningitis. He was started on ceftriaxone. Due to possible seizure, he was started on Keppra and placed on continuous EEG which was negative for seizure activity. 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 He received a 5 day course of SoluMedrol for cerebral edema. He then developed exteensor posturing with tachycardia and hypertension concerning for elevated intracranial pressure. He underwent Bolt ICP monitoring which demonstrated no elevated ICP. Follow up CT head and subsequent repeat MRI of brain showed small subacute hemorrhage in the right suboccipital lobe, either hemorrhagic conversion of small ischemic stroke vs extension of previously seen microhemorrhage, as well as hypoxic injury involving the bilateral caudate and putamen. He also sustained a NSTEMI. He was extubated on 10/10/2019 and underwent slow taper of Dilaudid to minimize withdrawal symptoms. A G-tube was inserted on 10/16/2019 and he was discharged on 10/18/2019 under the care of his mother who is a CNA.   He was brought to hospital with altered mental status.   Subjective   Patient seen and examined, no new complaints.   Assessment/Plan:     1. 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  Continue Abilify 5 mg twice daily, Lamictal 100 mg daily.  Keppra was recently discontinued as patient's EEG was unremarkable.  Continue Ativan, Haldol for agitation as needed.  Patient is also on Seroquel 150 mg twice daily.  He was off restraints on 01/11/2020. 2. 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  Medications adjusted as above. 3. Dysphagia-seen by speech therapy, continue regular diet.  PEG tube removed on 01/19/2020.  Continue ongoing speech therapy. 4. SVT/hypertension-patient had intermittent episodes of SVT, controlled and resolved with use of metoprolol. 5. Social issues-patient's mother is trained CNA and is no longer able to manage him at home.  Awaiting placement to facility.     COVID-19 Labs  No results for input(s): DDIMER, FERRITIN, LDH, CRP in the last 72 hours.  Lab Results  Component Value Date   SARSCOV2NAA NEGATIVE 01/01/2020     Scheduled medications:   . ARIPiprazole  5 mg Oral BID  . diazepam  5 mg Oral Q12H  . enoxaparin (LOVENOX) injection  40 mg Subcutaneous Q24H  . feeding supplement  237 mL Oral BID BM  . ferrous sulfate  325 mg Oral BID WC  . folic acid  1 mg Oral Daily  . lamoTRIgine  100 mg Oral Daily  . metoprolol tartrate  12.5 mg Oral BID  . polyethylene glycol  17 g Oral Daily  . QUEtiapine  150 mg Per Tube BID  . senna-docusate  2 tablet Oral BID           Antibiotics: Anti-infectives (From admission, onward)   Start     Dose/Rate Route Frequency Ordered Stop   01/07/20 1800  cefTRIAXone (ROCEPHIN) 1 g in sodium chloride 0.9 % 100 mL IVPB  Status:  Discontinued        1 g 200 mL/hr over 30 Minutes Intravenous Every 24 hours 01/07/20 1710 01/07/20 1714   01/07/20 1800  cefdinir (OMNICEF) capsule 300 mg  Status:  Discontinued        300  mg Oral Every 12 hours 01/07/20 1714 01/14/20 0752   01/02/20 2200  cefTRIAXone (ROCEPHIN) 1 g in sodium chloride 0.9 % 100 mL IVPB  Status:  Discontinued        1 g 200 mL/hr over 30 Minutes Intravenous Every 24 hours 01/01/20 2228 01/06/20 0854   01/01/20 2200  cefTRIAXone (ROCEPHIN) 1 g in sodium chloride 0.9 % 100 mL IVPB        1 g 200 mL/hr over 30 Minutes Intravenous  Once 01/01/20 2149 01/02/20 0026       DVT prophylaxis: Lovenox  Code Status: Full code  Family Communication: No family at bedside   Consultants: None  Procedures:      Objective   Vitals:   01/30/20 2031 01/30/20 2145 01/31/20 0300 01/31/20 1409  BP: (!) 100/51 124/76  119/77  Pulse: 100 (!) 110  92  Resp: 16 20  20   Temp: 97.8 F (36.6 C) 97.8 F (36.6 C)  98.2 F (36.8 C)  TempSrc: Oral   Oral  SpO2: 99% 100%  100%  Weight:   86.7 kg   Height:        Intake/Output Summary (Last 24 hours) at 01/31/2020 1417 Last data filed at 01/31/2020 1051 Gross per 24 hour  Intake 720 ml  Output 300 ml  Net 420 ml    11/25 1901 - 11/27 0700 In: 1800 [P.O.:1800] Out: -   Filed Weights   01/27/20 0434 01/29/20 0500 01/31/20 0300  Weight: 86.5 kg 86.6 kg 86.7 kg    Physical Examination:   General-appears in no acute distress Heart-S1-S2, regular, no murmur auscultated Lungs-clear to auscultation bilaterally, no wheezing or crackles auscultated Abdomen-soft, nontender, no organomegaly Extremities-no edema in the lower extremities Neuro-alert, oriented to self only  Status is: Inpatient  Dispo: The patient is from: Home              Anticipated d/c is to: Skilled nursing facility              Anticipated d/c date is: 02/02/2020              Patient currently medically stable for discharge  Barrier to discharge- awaiting bed at skilled nursing facility, hard to find appropriate facility due to behavioral issues.      02/04/2020   [facility] If 7PM-7AM, please contact  night-coverage at [email or web address], Office  [phone]   01/31/2020, 2:17 PM  LOS: 30 days

## 2020-02-01 NOTE — Progress Notes (Signed)
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  Hepresented to the [facility] ED on 09/28/2019 with altered mental status and fever of 104 F, WBC 17.4 and lactate 4.7 after a day of generalized malaise, nausea, vomiting, weaknessand headache. He had elevated troponins over 5000.Due to combativeness, he required sedation and subsequent intubation and [facility] and diagnosed with pneumococcal meningitis. He was started on ceftriaxone. Due to possible seizure, he was started on Keppra and placed on continuous EEG which was negative for seizure activity. 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 He received a 5 day course of SoluMedrol for cerebral edema. He then developed exteensor posturing with tachycardia and hypertension concerning for elevated intracranial pressure. He underwent Bolt ICP monitoring which demonstrated no elevated ICP. Follow up CT head and subsequent repeat MRI of brain showed small subacute hemorrhage in the right suboccipital lobe, either hemorrhagic conversion of small ischemic stroke vs extension of previously seen microhemorrhage, as well as hypoxic injury involving the bilateral caudate and putamen. He also sustained a NSTEMI. He was extubated on 10/10/2019 and underwent slow taper of Dilaudid to minimize withdrawal symptoms. A G-tube was inserted on 10/16/2019 and he was discharged on 10/18/2019 under the care of his mother who is a CNA.   He was brought to hospital with altered mental status.   Subjective   Patient seen and examined, no new complaints.   Assessment/Plan:     1. 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  Continue Abilify 5 mg twice daily, Lamictal 100 mg daily.  Keppra was recently discontinued as patient's EEG was unremarkable.  Continue Ativan, Haldol for agitation as needed.  Patient is also on Seroquel 150 mg twice daily.  He was off restraints on 01/11/2020. 2. 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  Medications adjusted as above. 3. Dysphagia-seen by speech therapy, continue regular diet.  PEG tube removed on 01/19/2020.  Continue ongoing speech therapy. 4. SVT/hypertension-patient had intermittent episodes of SVT, controlled and resolved with use of metoprolol. 5. Social issues-patient's mother is trained CNA and is no longer able to manage him at home.  Awaiting placement to facility.     COVID-19 Labs  No results for input(s): DDIMER, FERRITIN, LDH, CRP in the last 72 hours.  Lab Results  Component Value Date   SARSCOV2NAA NEGATIVE 01/01/2020     Scheduled medications:   . ARIPiprazole  5 mg Oral BID  . diazepam  5 mg Oral Q12H  . enoxaparin (LOVENOX) injection  40 mg Subcutaneous Q24H  . feeding supplement  237 mL Oral BID BM  . ferrous sulfate  325 mg Oral BID WC  . folic acid  1 mg Oral Daily  . lamoTRIgine  100 mg Oral Daily  . metoprolol tartrate  12.5 mg Oral BID  . polyethylene glycol  17 g Oral Daily  . QUEtiapine  150 mg Per Tube BID  . senna-docusate  2 tablet Oral BID           Antibiotics: Anti-infectives (From admission, onward)   Start     Dose/Rate Route Frequency Ordered Stop   01/07/20 1800  cefTRIAXone (ROCEPHIN) 1 g in sodium chloride 0.9 % 100 mL IVPB  Status:  Discontinued        1 g 200 mL/hr over 30 Minutes Intravenous Every 24 hours 01/07/20 1710 01/07/20 1714   01/07/20 1800  cefdinir (OMNICEF) capsule 300 mg  Status:  Discontinued        300  mg Oral Every 12 hours 01/07/20 1714 01/14/20 0752   01/02/20 2200  cefTRIAXone (ROCEPHIN) 1 g in sodium chloride 0.9 % 100 mL IVPB  Status:  Discontinued        1 g 200 mL/hr over 30 Minutes Intravenous Every 24 hours 01/01/20 2228 01/06/20 0854   01/01/20 2200  cefTRIAXone (ROCEPHIN) 1 g in sodium chloride 0.9 % 100 mL IVPB        1 g 200 mL/hr over 30 Minutes Intravenous  Once 01/01/20 2149 01/02/20 0026       DVT prophylaxis: Lovenox  Code Status: Full code  Family Communication: No family at bedside   Consultants: None  Procedures:      Objective   Vitals:   01/31/20 2010 01/31/20 2058 02/01/20 0655 02/01/20 0658  BP:  126/81  124/87  Pulse:  (!) 104  92  Resp:  20  20  Temp:  98.4 F (36.9 C)  98.1 F (36.7 C)  TempSrc:      SpO2: 99% 100%  100%  Weight:   93.1 kg   Height:        Intake/Output Summary (Last 24 hours) at 02/01/2020 1140 Last data filed at 02/01/2020 0900 Gross per 24 hour  Intake 960 ml  Output 300 ml  Net 660 ml    11/26 1901 - 11/28 0700 In: 840 [P.O.:720] Out: 600 [Urine:600]  Filed Weights   01/29/20 0500 01/31/20 0300 02/01/20 0655  Weight: 86.6 kg 86.7 kg 93.1 kg    Physical Examination:   General-appears in no acute distress Heart-S1-S2, regular, no murmur auscultated Lungs-clear to auscultation bilaterally, no wheezing or crackles auscultated Abdomen-soft, nontender, no organomegaly Extremities-no edema in the lower extremities Neuro-alert, oriented to self only  Status is: Inpatient  Dispo: The patient is from: Home              Anticipated d/c is to: Skilled nursing facility              Anticipated d/c date is: 02/03/2020              Patient currently medically stable for discharge  Barrier to discharge- awaiting bed at skilled nursing facility, hard to find appropriate facility due to behavioral issues.      [name]   [facility] If 7PM-7AM, please contact night-coverage at  [email or web address], Office  [phone]   02/01/2020, 11:40 AM  LOS: 31 days

## 2020-02-01 NOTE — Plan of Care (Signed)
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
## 2020-02-02 NOTE — Plan of Care (Signed)

## 2020-02-02 NOTE — Progress Notes (Signed)
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  Hepresented to the [facility] ED on 09/28/2019 with altered mental status and fever of 104 F, WBC 17.4 and lactate 4.7 after a day of generalized malaise, nausea, vomiting, weaknessand headache. He had elevated troponins over 5000.Due to combativeness, he required sedation and subsequent intubation and [facility] and diagnosed with pneumococcal meningitis. He was started on ceftriaxone. Due to possible seizure, he was started on Keppra and placed on continuous EEG which was negative for seizure activity. 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 He received a 5 day course of SoluMedrol for cerebral edema. He then developed exteensor posturing with tachycardia and hypertension concerning for elevated intracranial pressure. He underwent Bolt ICP monitoring which demonstrated no elevated ICP. Follow up CT head and subsequent repeat MRI of brain showed small subacute hemorrhage in the right suboccipital lobe, either hemorrhagic conversion of small ischemic stroke vs extension of previously seen microhemorrhage, as well as hypoxic injury involving the bilateral caudate and putamen. He also sustained a NSTEMI. He was extubated on 10/10/2019 and underwent slow taper of Dilaudid to minimize withdrawal symptoms. A G-tube was inserted on 10/16/2019 and he was discharged on 10/18/2019 under the care of his mother who is a CNA.   He was brought to hospital with altered mental status.   Subjective   Patient seen and examined, got agitated in the evening.  Improved after received  IM Haldol   Assessment/Plan:      1. 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  Continue Abilify 5 mg twice daily, Lamictal 100 mg daily.  Keppra was recently discontinued as patient's EEG was unremarkable.  Continue Ativan, Haldol for agitation as needed.  Patient is also on Seroquel 150 mg twice daily.  He was off restraints on 01/11/2020.  Will obtain behavioral medicine consult for further recommendations. 2. 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  Medications adjusted as above. 3. Dysphagia-seen by speech therapy, continue regular diet.  PEG tube removed on 01/19/2020.  Continue ongoing speech therapy. 4. SVT/hypertension-patient had intermittent episodes of SVT, controlled and resolved with use of metoprolol. 5. Social issues-patient's mother is trained CNA and is no longer able to manage him at home.  Awaiting placement to facility.     COVID-19 Labs  No results for input(s): DDIMER, FERRITIN, LDH, CRP in the last 72 hours.  Lab Results  Component Value Date   SARSCOV2NAA NEGATIVE 01/01/2020     Scheduled medications:   . ARIPiprazole  5 mg Oral BID  . diazepam  5 mg Oral Q12H  . enoxaparin (LOVENOX) injection  40 mg Subcutaneous Q24H  . feeding supplement  237 mL Oral BID BM  . ferrous sulfate  325 mg Oral BID WC  . folic acid  1 mg Oral Daily  . lamoTRIgine  100 mg Oral Daily  . metoprolol tartrate  12.5 mg Oral BID  . polyethylene glycol  17 g Oral Daily  . QUEtiapine  150 mg Per Tube BID  . senna-docusate  2 tablet Oral BID           Antibiotics: Anti-infectives (From admission,  onward)   Start     Dose/Rate Route Frequency Ordered Stop   01/07/20 1800  cefTRIAXone (ROCEPHIN) 1 g in sodium chloride 0.9 % 100 mL IVPB  Status:  Discontinued        1 g 200 mL/hr over 30 Minutes Intravenous Every 24 hours  01/07/20 1710 01/07/20 1714   01/07/20 1800  cefdinir (OMNICEF) capsule 300 mg  Status:  Discontinued        300 mg Oral Every 12 hours 01/07/20 1714 01/14/20 0752   01/02/20 2200  cefTRIAXone (ROCEPHIN) 1 g in sodium chloride 0.9 % 100 mL IVPB  Status:  Discontinued        1 g 200 mL/hr over 30 Minutes Intravenous Every 24 hours 01/01/20 2228 01/06/20 0854   01/01/20 2200  cefTRIAXone (ROCEPHIN) 1 g in sodium chloride 0.9 % 100 mL IVPB        1 g 200 mL/hr over 30 Minutes Intravenous  Once 01/01/20 2149 01/02/20 0026       DVT prophylaxis: Lovenox  Code Status: Full code  Family Communication: No family at bedside   Consultants: None  Procedures:      Objective   Vitals:   02/01/20 1452 02/01/20 2121 02/01/20 2246 02/02/20 0618  BP: 124/75  113/66 130/70  Pulse: (!) 104  (!) 102 93  Resp: 20  18 20   Temp: 98 F (36.7 C)  98.1 F (36.7 C)   TempSrc:      SpO2: 100% 98% 99% 100%  Weight:    89.6 kg  Height:        Intake/Output Summary (Last 24 hours) at 02/02/2020 1756 Last data filed at 02/02/2020 1500 Gross per 24 hour  Intake 720 ml  Output --  Net 720 ml    11/27 1901 - 11/29 0700 In: 480 [P.O.:480] Out: -   Filed Weights   01/31/20 0300 02/01/20 0655 02/02/20 0618  Weight: 86.7 kg 93.1 kg 89.6 kg    Physical Examination:   General-appears in no acute distress Heart-S1-S2, regular, no murmur auscultated Lungs-clear to auscultation bilaterally, no wheezing or crackles auscultated Abdomen-soft, nontender, no organomegaly Extremities-no edema in the lower extremities Neuro-alert, oriented to self only  Status is: Inpatient  Dispo: The patient is from: Home              Anticipated d/c is to: Skilled nursing facility              Anticipated d/c date is: 02/06/2020              Patient currently medically stable for discharge  Barrier to discharge- awaiting bed at skilled nursing facility, hard to find appropriate facility due to  behavioral issues.      14/05/2019   [facility] If 7PM-7AM, please contact night-coverage at [email or web address], Office  [phone]   02/02/2020, 5:56 PM  LOS: 32 days

## 2020-02-02 NOTE — TOC Progression Note (Signed)
Transition of Care [facility]) - Progression Note    Patient Details  Name: Isaac [name] MRN: [id number] Date of Birth: 09-12-1980  [facility] of [facility] CM/SW Contact  Karn Cassis, [place] Phone Number: 02/02/2020, 10:30 AM  Clinical Narrative:  [facility] leadership continues to search for SNF bed. Will continue to follow.         Barriers to Discharge: No SNF bed  Expected Discharge Plan and Services           Expected Discharge Date: 01/26/20                                     Social Determinants of Health (SDOH) Interventions    Readmission Risk Interventions No flowsheet data found.

## 2020-02-03 MED ORDER — THIAMINE HCL 100 MG PO TABS
100.0000 mg | ORAL_TABLET | Freq: Every day | ORAL | Status: DC
  Administered 2020-02-03 – 2020-02-23 (×21): 100 mg via ORAL
  Filled 2020-02-03 (×21): qty 1

## 2020-02-03 MED ORDER — QUETIAPINE FUMARATE 100 MG PO TABS
300.0000 mg | ORAL_TABLET | Freq: Every morning | ORAL | Status: DC
  Administered 2020-02-04 – 2020-02-23 (×20): 300 mg via ORAL
  Filled 2020-02-03 (×21): qty 3

## 2020-02-03 MED ORDER — QUETIAPINE FUMARATE 100 MG PO TABS
400.0000 mg | ORAL_TABLET | Freq: Every day | ORAL | Status: DC
  Administered 2020-02-03 – 2020-02-22 (×20): 400 mg via ORAL
  Filled 2020-02-03 (×20): qty 4

## 2020-02-03 MED ORDER — QUETIAPINE FUMARATE 100 MG PO TABS: 300.0000 mg | ORAL_TABLET | Freq: Every morning | ORAL | Status: DC

## 2020-02-03 MED ORDER — QUETIAPINE FUMARATE 25 MG PO TABS
150.0000 mg | ORAL_TABLET | Freq: Once | ORAL | Status: AC
  Administered 2020-02-03: 150 mg via ORAL
  Filled 2020-02-03: qty 2

## 2020-02-03 NOTE — Progress Notes (Signed)
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  Hepresented to the [facility] ED on 09/28/2019 with altered mental status and fever of 104 F, WBC 17.4 and lactate 4.7 after a day of generalized malaise, nausea, vomiting, weaknessand headache. He had elevated troponins over 5000.Due to combativeness, he required sedation and subsequent intubation [facility] and [facility] with pneum[facility]. He was started on ceftriaxone. Due to possible seizure, he was started on Keppra and placed on continuous EEG which was negative for seizure activity. 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 He received a 5 day course of SoluMedrol for cerebral edema. He then developed exteensor posturing with tachycardia and hypertension concerning for elevated intracranial pressure. He underwent Bolt ICP monitoring which demonstrated no elevated ICP. Follow up CT head and subsequent repeat MRI of brain showed small subacute hemorrhage in the right suboccipital lobe, either hemorrhagic conversion of small ischemic stroke vs extension of previously seen microhemorrhage, as well as hypoxic injury involving the bilateral caudate and putamen. He also sustained a NSTEMI. He was extubated on 10/10/2019 and underwent slow taper of Dilaudid to minimize withdrawal symptoms. A G-tube was inserted on 10/16/2019 and he was discharged on 10/18/2019 under the care of his mother who is a CNA.   He was brought to hospital with altered mental status.   Subjective   Patient seen and examined, denies any new complaints.   Assessment/Plan:     1. 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  Continue  Lamictal 100 mg daily.  Keppra was recently discontinued as patient's EEG was unremarkable.  Continue Ativan, Haldol for agitation as needed. He was off restraints on 01/11/2020. 2. Agitation-patient having episodes of agitation where he would become violent and hit staff members.  Haldol and Ativan seems to work but because of these recurrent episodes, TTS was consulted for behavior management.  After discussion with TTS, will increase the dose of Seroquel to 300 mg p.o. every morning and 400 mg p.o. every afternoon.  Will discontinue Abilify. 3. 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  Medications adjusted as above. 4. Dysphagia-seen by speech therapy, continue regular diet.  PEG tube removed on 01/19/2020.  Continue ongoing speech therapy. 5. SVT/hypertension-patient had intermittent episodes of SVT, controlled and resolved with use of metoprolol. 6. Social issues-patient's mother is trained CNA and is no longer able to manage him at home.  Awaiting placement to facility.     COVID-19 Labs  No results for input(s): DDIMER, FERRITIN, LDH, CRP in the last 72 hours.  Lab Results  Component Value Date   SARSCOV2NAA NEGATIVE 01/01/2020     Scheduled medications:   . diazepam  5 mg Oral Q12H  . enoxaparin (LOVENOX) injection  40 mg Subcutaneous Q24H  . feeding supplement  237 mL Oral BID BM  . ferrous sulfate  325 mg Oral BID WC  . folic acid  1 mg Oral Daily  . lamoTRIgine  100 mg Oral Daily  . metoprolol tartrate  12.5 mg Oral BID  . polyethylene glycol  17 g Oral Daily  . QUEtiapine  150 mg Oral Once  . [  START ON 02/04/2020] QUEtiapine  300 mg Oral q AM  . QUEtiapine  400 mg Oral QHS  . senna-docusate  2 tablet Oral BID  . thiamine  100 mg Oral Daily    Antibiotics: Anti-infectives (From admission, onward)   Start     Dose/Rate Route Frequency Ordered Stop   01/07/20 1800  cefTRIAXone (ROCEPHIN) 1 g in sodium chloride 0.9 % 100 mL IVPB  Status:  Discontinued        1 g 200 mL/hr over 30 Minutes Intravenous Every 24 hours 01/07/20 1710 01/07/20 1714   01/07/20 1800  cefdinir (OMNICEF) capsule 300 mg  Status:  Discontinued        300 mg Oral Every 12 hours 01/07/20 1714 01/14/20 0752   01/02/20 2200  cefTRIAXone (ROCEPHIN) 1 g in sodium chloride 0.9 % 100 mL IVPB  Status:  Discontinued        1 g 200 mL/hr over 30 Minutes Intravenous Every 24 hours 01/01/20 2228 01/06/20 0854   01/01/20 2200  cefTRIAXone (ROCEPHIN) 1 g in sodium chloride 0.9 % 100 mL IVPB        1 g 200 mL/hr over 30 Minutes Intravenous  Once 01/01/20 2149 01/02/20 0026       DVT prophylaxis: Lovenox  Code Status: Full code  Family Communication: No family at bedside   Consultants: None    Objective   Vitals:   02/01/20 2121 02/01/20 2246 02/02/20 0618 02/02/20 2121  BP:  113/66 130/70 102/76  Pulse:  (!) 102 93 85  Resp:  18 20 20   Temp:  98.1 F (36.7 C)  97.7 F (36.5 C)  TempSrc:    Axillary  SpO2: 98% 99% 100% 100%  Weight:   89.6 kg 88 kg  Height:        Intake/Output Summary (Last 24 hours) at 02/03/2020 1212 Last data filed at 02/03/2020 0830 Gross per 24 hour  Intake 730 ml  Output --  Net 730 ml    11/28 1901 - 11/30 0700 In: 720 [P.O.:720] Out: -   Filed Weights   02/01/20 0655 02/02/20 0618 02/02/20 2121  Weight: 93.1 kg 89.6 kg 88 kg    Physical Examination:  General-appears in no acute distress Heart-S1-S2, regular, no murmur auscultated Lungs-clear to auscultation bilaterally, no wheezing or crackles auscultated Abdomen-soft, nontender, no organomegaly Extremities-no edema in the lower extremities Neuro-alert, oriented to self only  Status is: Inpatient  Dispo: The patient is from: Home               Anticipated d/c is to: Skilled nursing facility              Anticipated d/c date is: 02/05/20              Patient currently medically stable for discharge  Barrier to discharge- awaiting bed at skilled nursing facility, hard to find appropriate facility due to behavioral issues.      14/2/21   [facility] If 7PM-7AM, please contact night-coverage at [email or web address], Office  [phone]   02/03/2020, 12:12 PM  LOS: 33 days

## 2020-02-03 NOTE — Plan of Care (Signed)

## 2020-02-03 NOTE — TOC Progression Note (Signed)
Transition of Care [facility]) - Progression Note    Patient Details  Name: Isaac [name] MRN: [id number] Date of Birth: 1981-01-13  Transition of Care [facility]) CM/SW Contact  Elliot Gault, LCSW Phone Number: 02/03/2020, 10:50 AM  Clinical Narrative:     TOC following. TTS consult requested by MD for assistance with medication adjustments. Recommended adjustments will be made and TOC will continue to work on placement needs. Main barriers to placement are financial and behavioral.    Barriers to Discharge: No SNF bed  Expected Discharge Plan and Services           Expected Discharge Date: 01/26/20                                     Social Determinants of Health (SDOH) Interventions    Readmission Risk Interventions No flowsheet data found.

## 2020-02-03 NOTE — B[facility] Counselor (Signed)
TTS attempted to complete assessment. Patient largely nonverbal. When asked orientation questions he answered "no" to all. Chart review completed and discussed with attending MD, as well as [facility] provider, Marciano Sequin, PMHNP, who is making medication recommendations.   Patient does not meet in patient care criteria. Med recommendations made by provider.

## 2020-02-03 NOTE — Progress Notes (Signed)
Physical Therapy Treatment Patient Details Name: Isaac [name] MRN: [id number] DOB: 1981/02/10 Today's Date: 02/03/2020    History of Present Illness Isaac [name] is a 39 y.o. male with medical history significant of history of bacterial meningitis about 3 months ago, NSTEMI due to demand ischemia during his meningitis hospitalization (see EDP note for attached discharge summary) who is brought to the emergency department by family members due to the patient's worsening mental status.  The patient's mental status has been progressively worse.  He was seen at [facility] about 4 days ago and given Geodon due to restlessness, but subsequently discharged home.  His mother has been having a very difficult time taking care of him at home.  Neurology has been trying to assist in long-term placement, but the patient is uninsured and his mother is unable to afford it.  Neurology recently discontinue Keppra and started on Lamictal to control his agitation.  He has also been given Seroquel without significant results.    PT Comments    Patient presents seated in chair (assisted by nursing staff) and agreeable for therapy - his mother present in room.  Patient tolerated standing for up to 7-8 minutes while having depends donned with good return for holding onto RW, once fatigued patient has difficulty extending trunk, leans forward and had to sit to avoid falling.  Patient requires repeated verbal/tactile cueing  and active assistance to complete exercises.  Patient tolerated staying up in chair after therapy with his mother in room.  Patient will benefit from continued physical therapy in hospital and recommended venue below to increase strength, balance, endurance for safe ADLs and gait.   Follow Up Recommendations  SNF     Equipment Recommendations  Rolling walker with 5" wheels    Recommendations for Other Services       Precautions / Restrictions Precautions Precautions:  Fall Restrictions Weight Bearing Restrictions: No    Mobility  Bed Mobility               General bed mobility comments: Patient presents in chair (assisted by nursing staff)  Transfers Overall transfer level: Needs assistance Equipment used: Rolling walker (2 wheeled);2 person hand held assist Transfers: Sit to/from Stand Sit to Stand: Mod assist         General transfer comment: unsteady on feet, tolerated standing for up to 7-8 minutes before having to sit due to fatigue  Ambulation/Gait                 Stairs             Wheelchair Mobility    Modified Rankin (Stroke Patients Only)       Balance Overall balance assessment: Needs assistance Sitting-balance support: Feet supported;No upper extremity supported Sitting balance-Leahy Scale: Fair Sitting balance - Comments: fair/good seated at edge of chair   Standing balance support: During functional activity;Bilateral upper extremity supported Standing balance-Leahy Scale: Poor Standing balance comment: using RW with 2 person hand held assist                            Cognition Arousal/Alertness: Awake/alert Behavior During Therapy: Impulsive;Restless Overall Cognitive Status: History of cognitive impairments - at baseline                                        Exercises General Exercises - Lower  Extremity Long Arc Quad: Seated;AAROM;Strengthening;Both;20 reps Hip Flexion/Marching: Seated;AAROM;Strengthening;Both;20 reps Other Exercises Other Exercises: stretching to BLE heelcords 10 x 3 second holds    General Comments        Pertinent Vitals/Pain Pain Assessment: Faces Pain Score: 2  Pain Location: stretching to heel cords, hamstrings Pain Descriptors / Indicators: Grimacing;Discomfort Pain Intervention(s): Limited activity within patient's tolerance;Monitored during session;Repositioned    Home Living                      Prior Function             PT Goals (current goals can now be found in the care plan section) Acute Rehab PT Goals Patient Stated Goal: not stated PT Goal Formulation: With patient Time For Goal Achievement: 02/11/20 Potential to Achieve Goals: Fair Progress towards PT goals: Progressing toward goals    Frequency    Min 2X/week      PT Plan Current plan remains appropriate    Co-evaluation              AM-PAC PT "6 Clicks" Mobility   Outcome Measure  Help needed turning from your back to your side while in a flat bed without using bedrails?: A Little Help needed moving from lying on your back to sitting on the side of a flat bed without using bedrails?: A Lot Help needed moving to and from a bed to a chair (including a wheelchair)?: A Lot Help needed standing up from a chair using your arms (e.g., wheelchair or bedside chair)?: A Lot Help needed to walk in hospital room?: A Lot Help needed climbing 3-5 steps with a railing? : Total 6 Click Score: 12    End of Session   Activity Tolerance: Patient tolerated treatment well;Patient limited by fatigue;Treatment limited secondary to agitation Patient left: in chair;with chair alarm set;with call bell/phone within reach;with family/visitor present Nurse Communication: Mobility status PT Visit Diagnosis: Unsteadiness on feet (R26.81);Other abnormalities of gait and mobility (R26.89);Muscle weakness (generalized) (M62.81)     Time: 0929-5747 PT Time Calculation (min) (ACUTE ONLY): 27 min  Charges:  $Therapeutic Exercise: 8-22 mins $Therapeutic Activity: 8-22 mins                     1:57 PM, 02/03/20 Ocie Bob, MPT Physical Therapist with [facility] [phone] office [phone] mobile phone

## 2020-02-04 DIAGNOSIS — N3001 Acute cystitis with hematuria: Secondary | ICD-10-CM | POA: Diagnosis not present

## 2020-02-04 DIAGNOSIS — R9431 Abnormal electrocardiogram [ECG] [EKG]: Secondary | ICD-10-CM

## 2020-02-04 DIAGNOSIS — N390 Urinary tract infection, site not specified: Secondary | ICD-10-CM | POA: Diagnosis not present

## 2020-02-04 DIAGNOSIS — R451 Restlessness and agitation: Secondary | ICD-10-CM | POA: Diagnosis not present

## 2020-02-04 MED ORDER — METOPROLOL TARTRATE 25 MG PO TABS
25.0000 mg | ORAL_TABLET | Freq: Two times a day (BID) | ORAL | Status: DC
  Administered 2020-02-04 – 2020-02-09 (×10): 25 mg via ORAL
  Filled 2020-02-04 (×10): qty 1

## 2020-02-04 MED ORDER — RIVAROXABAN 10 MG PO TABS
10.0000 mg | ORAL_TABLET | Freq: Every day | ORAL | Status: DC
  Administered 2020-02-04 – 2020-02-22 (×17): 10 mg via ORAL
  Filled 2020-02-04 (×19): qty 1

## 2020-02-04 MED ORDER — FERROUS SULFATE 325 (65 FE) MG PO TABS
325.0000 mg | ORAL_TABLET | Freq: Every day | ORAL | Status: DC
  Administered 2020-02-05 – 2020-02-23 (×19): 325 mg via ORAL
  Filled 2020-02-04 (×19): qty 1

## 2020-02-04 NOTE — Progress Notes (Signed)
Patient has been violent and verbally abusive each time he had to be cleaned up or assisted on night shift.  Security had to be called twice on this shift for assistance. PRN Haldol given to help with agitation.

## 2020-02-04 NOTE — Plan of Care (Signed)

## 2020-02-04 NOTE — Progress Notes (Signed)
Nutrition Follow-up  DOCUMENTATION CODES:   Not applicable  INTERVENTION:  Continue Ensure Enlive po BID, each supplement provides 350 kcal and 20 grams of protein  Continue Magic cup BID with meals, each supplement provides 290 kcal and 9 grams of protein  Continue to encourage po intake of meals and supplements  NUTRITION DIAGNOSIS:   Inadequate oral intake related to inability to eat as evidenced by  (PEG tube dependent for nutrition/hydration and medications s/p multiple subacute strokes with residual weakness secondary to recent bacterial meningitis). -Resolved, continues to tolerate oral diet, PEG removed 11/15  GOAL:   Patient will meet greater than or equal to 90% of their needs -Meeting with po intake of meals and supplements  MONITOR:   PO intake, Weight trends, Labs, Diet advancement, I & O's  REASON FOR ASSESSMENT:   Rounds    ASSESSMENT:   39 year old male with history of recent hospitalization for bacterial meningitis (7/24-8/14). 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 Pt presented with progressively worsening mental status, increased agitation and family reports unable to safely manage him at home.  Patient sleeping soundly this morning at RD visit. He continues to have good po intake of meals, eating 50-100% (84% average of the last 8 documented meals 11/26-11/30) and is consuming 100% of Ensure supplements BID.   Weights have been fairly stable since last assessment, +15 lbs since admit.  Medications reviewed and include: Valium, Ferrous sulfate, Folic acid, Miralax, Seroquel, Senokot, Thiamine  No new labs for review  Diet Order:   Diet Order            Diet regular Room service appropriate? Yes; Fluid consistency: Thin  Diet effective now                 EDUCATION NEEDS:   Education needs have been  addressed  Skin:  Skin Assessment: Skin Integrity Issues: Skin Integrity Issues:: Other (Comment) Other: MASD;buttocks;left  Last BM:  11/29  Height:   Ht Readings from Last 1 Encounters:  01/02/20 6\' 2"  (1.88 m)    Weight:   Wt Readings from Last 1 Encounters:  02/04/20 90 kg    BMI:  Body mass index is 25.47 kg/m.  Estimated Nutritional Needs:   Kcal:  2080-2330  Protein:  105-115  Fluid:  >/= 2 L/day   06-10-1970, RD, LDN Clinical Nutrition After Hours/Weekend Pager # in Amion

## 2020-02-04 NOTE — Progress Notes (Signed)
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  Hepresented to the [facility] ED on 09/28/2019 with altered mental status and fever of 104 F, WBC 17.4 and lactate 4.7 after a day of generalized malaise, nausea, vomiting, weaknessand headache. He had elevated troponins over 5000.Due to combativeness, he required sedation and subsequent intubation [facility] and diagnosed with pneumococcal meningitis. He was started on ceftriaxone. Due to possible seizure, he was started on Keppra and placed on continuous EEG which was negative for seizure activity. 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 He received a 5 day course of SoluMedrol for cerebral edema. He then developed exteensor posturing with tachycardia and hypertension concerning for elevated intracranial pressure. He underwent Bolt ICP monitoring which demonstrated no elevated ICP. Follow up CT head and subsequent repeat MRI of brain showed small subacute hemorrhage in the right suboccipital lobe, either hemorrhagic conversion of small ischemic stroke vs extension of previously seen microhemorrhage, as well as hypoxic injury involving the bilateral caudate and putamen. He also sustained a NSTEMI. He was extubated on 10/10/2019 and underwent slow taper of Dilaudid to minimize withdrawal symptoms. A G-tube was inserted on 10/16/2019 and he was discharged on 10/18/2019 under the care of his mother who is a CNA.   He was brought to hospital with altered mental status.   Subjective   Patient seen and examined, denies any new complaints.   Assessment/Plan:   1. 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  Continue  Lamictal 100 mg daily.  Keppra was recently discontinued as patient's EEG was unremarkable.  Continue Ativan, Haldol for agitation as needed. He was off restraints on 01/11/2020. 2. Agitation-IMPROVING.  Haldol and Ativan HELPING. TTS was consulted for assistance.  After discussion with TTS, will increase the dose of Seroquel to 300 mg p.o. every morning and 400 mg p.o. every afternoon.  3. 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  Medications adjusted as above. 4. Dysphagia-seen by speech therapy, continue regular diet.  PEG tube removed on 01/19/2020.  Continue ongoing speech therapy. 5. SVT/hypertension-patient had intermittent episodes of SVT, controlled and resolved with use of metoprolol. 6. Social issues-patient's mother is trained CNA and is no longer able to manage him at home.  Awaiting placement to facility.   COVID-19 Labs  No results for input(s): DDIMER, FERRITIN, LDH, CRP in the last 72 hours.  Lab Results  Component Value Date   SARSCOV2NAA NEGATIVE 01/01/2020     Scheduled medications:   . diazepam  5 mg Oral Q12H  . feeding supplement  237 mL Oral BID BM  . ferrous sulfate  325 mg Oral BID WC  . folic acid  1 mg Oral Daily  . lamoTRIgine  100 mg Oral Daily  . metoprolol tartrate  12.5 mg Oral BID  . polyethylene glycol  17 g Oral Daily  . QUEtiapine  300 mg Oral q AM  . QUEtiapine  400 mg Oral QHS  . rivaroxaban  10 mg Oral Q supper  . senna-docusate  2 tablet Oral BID  . thiamine  100 mg Oral Daily   Antibiotics: Anti-infectives (From admission, onward)  Start     Dose/Rate Route Frequency Ordered Stop   01/07/20 1800  cefTRIAXone (ROCEPHIN) 1 g in sodium chloride 0.9 % 100 mL IVPB  Status:  Discontinued        1 g 200 mL/hr over 30 Minutes Intravenous Every  24 hours 01/07/20 1710 01/07/20 1714   01/07/20 1800  cefdinir (OMNICEF) capsule 300 mg  Status:  Discontinued        300 mg Oral Every 12 hours 01/07/20 1714 01/14/20 0752   01/02/20 2200  cefTRIAXone (ROCEPHIN) 1 g in sodium chloride 0.9 % 100 mL IVPB  Status:  Discontinued        1 g 200 mL/hr over 30 Minutes Intravenous Every 24 hours 01/01/20 2228 01/06/20 0854   01/01/20 2200  cefTRIAXone (ROCEPHIN) 1 g in sodium chloride 0.9 % 100 mL IVPB        1 g 200 mL/hr over 30 Minutes Intravenous  Once 01/01/20 2149 01/02/20 0026       DVT prophylaxis: Lovenox  Code Status: Full code  Family Communication: No family at bedside   Consultants: None  Objective   Vitals:   02/04/20 0500 02/04/20 0614 02/04/20 1500 02/04/20 1600  BP:  125/79 135/83 135/83  Pulse:  (!) 108 (!) 125 (!) 125  Resp:      Temp:   (!) 97.5 F (36.4 C) (!) 97.5 F (36.4 C)  TempSrc:   Oral Oral  SpO2:   100% 100%  Weight: 90 kg     Height:       No intake or output data in the 24 hours ending 02/04/20 1816  11/29 1901 - 12/01 0700 In: 250 [P.O.:250] Out: -   Filed Weights   02/02/20 0618 02/02/20 2121 02/04/20 0500  Weight: 89.6 kg 88 kg 90 kg    Physical Examination:  General-appears in no acute distress Heart-S1-S2, regular, no murmur auscultated Lungs-clear to auscultation bilaterally, no wheezing or crackles auscultated Abdomen-soft, nontender, no organomegaly Extremities-no edema in the lower extremities Neuro-alert, oriented to self only  Status is: Inpatient  Dispo: The patient is from: Home              Anticipated d/c is to: Skilled nursing facility              Anticipated d/c date is: 02/08/20              Patient currently medically stable for discharge  Barrier to discharge- awaiting bed at skilled nursing facility, hard to find appropriate facility due to behavioral issues.    Standley Dakins MD   [facility] If 7PM-7AM, please contact night-coverage at  [email or web address], Office  [phone]   02/04/2020, 6:16 PM  LOS: 34 days

## 2020-02-04 NOTE — Progress Notes (Signed)
SLP Cancellation Note  Patient Details Name: Isaac [name] MRN: [id number] DOB: 1980/11/09   Cancelled treatment:       Reason Eval/Treat Not Completed: Patient's level of consciousness. Pt was napping upon SLP entering the room, despite gentle efforts to rouse the Pt and engage him to provide skilled ST he repeatedly said "no" and began to get very agitated with repeated attempts from SLP to keep his eyes open and elicit verbal responses. ST will continue efforts.  Garrin Kirwan H. Romie Levee, CCC-SLP Speech Language Pathologist   Georgetta Haber 02/04/2020, 2:23 PM

## 2020-02-05 DIAGNOSIS — N390 Urinary tract infection, site not specified: Secondary | ICD-10-CM | POA: Diagnosis not present

## 2020-02-05 DIAGNOSIS — N3001 Acute cystitis with hematuria: Secondary | ICD-10-CM | POA: Diagnosis not present

## 2020-02-05 DIAGNOSIS — R451 Restlessness and agitation: Secondary | ICD-10-CM | POA: Diagnosis not present

## 2020-02-05 DIAGNOSIS — I69319 Unspecified symptoms and signs involving cognitive functions following cerebral infarction: Secondary | ICD-10-CM | POA: Diagnosis not present

## 2020-02-05 NOTE — Progress Notes (Signed)
Ambulate to Isaac [name] with 2 person assist. Pt was able to tolerate standing for extended period while being cleaned up.

## 2020-02-05 NOTE — TOC Progression Note (Signed)
Transition of Care [facility]) - Progression Note    Patient Details  Name: Isaac [name] MRN: [id number] Date of Birth: 26-Jun-1980  [facility] CM/SW Contact  Annice Needy, LCSW Phone Number: 02/05/2020, 2:01 PM  Clinical Narrative:    Referral faxed to [facility] in [place], [place].      Barriers to Discharge: No SNF bed  Expected Discharge Plan and Services           Expected Discharge Date: 01/26/20                                     Social Determinants of Health (SDOH) Interventions    Readmission Risk Interventions No flowsheet data found.

## 2020-02-05 NOTE — Progress Notes (Addendum)
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  [facility] to the [facility] ED on 09/28/2019 with altered mental status and fever of 104 F, WBC 17.4 and lactate 4.7 after a day of generalized malaise, nausea, vomiting, weaknessand headache. He had elevated troponins over 5000.Due to combativeness, he required sedation and subsequent intubation [facility]. He was started on ceftriaxone. Due to possible seizure, he was started on Keppra and placed on continuous EEG which was negative for seizure activity. 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 He received a 5 day course of SoluMedrol for cerebral edema. He then developed exteensor posturing with tachycardia and hypertension concerning for elevated intracranial pressure. He underwent Bolt ICP monitoring which demonstrated no elevated ICP. Follow up CT head and subsequent repeat MRI of brain showed small subacute hemorrhage in the right suboccipital lobe, either hemorrhagic conversion of small ischemic stroke vs extension of previously seen microhemorrhage, as well as hypoxic injury involving the bilateral caudate and putamen. He also sustained a NSTEMI. He was extubated on 10/10/2019 and underwent slow taper of Dilaudid to minimize withdrawal symptoms. A G-tube was inserted on 10/16/2019 and he was discharged on 10/18/2019 under the care of his mother who is a CNA.   He was brought to hospital with altered mental status.   Subjective   Patient seen and examined, denies any new complaints.   Assessment/Plan:   1. 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 Patient's behavior has been stable for last several days.   Continue  Lamictal 100 mg daily. Continue Ativan, Haldol for agitation as needed. He was off restraints since 01/11/2020. 2. Agitation-IMPROVED.  Haldol and Ativan HELPING. TTS was consulted for assistance.  After discussion with TTS, increased the dose of Seroquel to 300 mg p.o. every morning and 400 mg p.o. every evening.  3. 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  Medications adjusted as above. 4. Dysphagia-seen by speech therapy, continue regular diet.  PEG tube removed on 01/19/2020.  Continue ongoing speech therapy. 5. SVT/hypertension-patient had intermittent episodes of SVT, controlled and resolved with use of metoprolol. 6. Social issues-patient's mother is trained CNA and is no longer able to manage him at home.  Awaiting placement to facility.   COVID-19 Labs  No results for input(s): DDIMER, FERRITIN, LDH, CRP in the last 72 hours.  Lab Results  Component Value Date   SARSCOV2NAA NEGATIVE 01/01/2020     Scheduled medications:   . diazepam  5 mg Oral Q12H  . feeding supplement  237 mL Oral BID BM  . ferrous sulfate  325 mg Oral Q breakfast  . folic acid  1 mg Oral Daily  . lamoTRIgine  100 mg Oral Daily  . metoprolol tartrate  25 mg Oral BID  . polyethylene glycol  17 g Oral Daily  . QUEtiapine  300 mg Oral q AM  . QUEtiapine  400 mg Oral QHS  . rivaroxaban  10 mg Oral Q supper  . senna-docusate  2 tablet Oral BID  . thiamine  100 mg Oral Daily   Antibiotics: Anti-infectives (From admission, onward)   Start     Dose/Rate Route Frequency Ordered Stop  01/07/20 1800  cefTRIAXone (ROCEPHIN) 1 g in sodium chloride 0.9 % 100 mL IVPB  Status:  Discontinued        1 g 200 mL/hr over 30 Minutes Intravenous Every 24 hours 01/07/20 1710 01/07/20 1714   01/07/20 1800  cefdinir (OMNICEF) capsule 300 mg  Status:  Discontinued         300 mg Oral Every 12 hours 01/07/20 1714 01/14/20 0752   01/02/20 2200  cefTRIAXone (ROCEPHIN) 1 g in sodium chloride 0.9 % 100 mL IVPB  Status:  Discontinued        1 g 200 mL/hr over 30 Minutes Intravenous Every 24 hours 01/01/20 2228 01/06/20 0854   01/01/20 2200  cefTRIAXone (ROCEPHIN) 1 g in sodium chloride 0.9 % 100 mL IVPB        1 g 200 mL/hr over 30 Minutes Intravenous  Once 01/01/20 2149 01/02/20 0026       DVT prophylaxis: Lovenox  Code Status: Full code  Family Communication: family at bedside updated 12/2   Consultants: TTS  Objective   Vitals:   02/04/20 1600 02/04/20 2157 02/05/20 0629 02/05/20 1456  BP: 135/83 133/78 132/89 117/74  Pulse: (!) 125 (!) 129 (!) 112 99  Resp:  20 19 18   Temp: (!) 97.5 F (36.4 C) 97.7 F (36.5 C) 98.5 F (36.9 C) 98 F (36.7 C)  TempSrc: Oral   Oral  SpO2: 100% 100% 100% 100%  Weight:      Height:       No intake or output data in the 24 hours ending 02/05/20 1900  No intake/output data recorded.  Filed Weights   02/02/20 0618 02/02/20 2121 02/04/20 0500  Weight: 89.6 kg 88 kg 90 kg    Physical Examination:  General-awake, alert, calm, appears in no acute distress Heart-S1-S2, regular, no murmur auscultated Lungs-clear to auscultation bilaterally, no wheezing or crackles auscultated Abdomen-soft, nontender, no organomegaly Extremities-no edema in the lower extremities Neuro-alert, oriented to self only  Status is: Inpatient  Dispo: The patient is from: Home              Anticipated d/c is to: Skilled nursing facility              Anticipated d/c date is: 02/08/20              Patient currently medically stable for discharge  Barrier to discharge- awaiting bed at skilled nursing facility, difficult placement due to past behavioral issues.    14/5/21 MD   [facility] If 7PM-7AM, please contact night-coverage at [email or web address], Office  [phone]  02/05/2020, 7:00 PM  LOS: 35 days

## 2020-02-06 DIAGNOSIS — N3001 Acute cystitis with hematuria: Secondary | ICD-10-CM | POA: Diagnosis not present

## 2020-02-06 DIAGNOSIS — N390 Urinary tract infection, site not specified: Secondary | ICD-10-CM | POA: Diagnosis not present

## 2020-02-06 DIAGNOSIS — R9431 Abnormal electrocardiogram [ECG] [EKG]: Secondary | ICD-10-CM | POA: Diagnosis not present

## 2020-02-06 DIAGNOSIS — R451 Restlessness and agitation: Secondary | ICD-10-CM | POA: Diagnosis not present

## 2020-02-06 NOTE — Progress Notes (Signed)
  Speech Language Pathology Treatment: Cognitive-Linquistic  Patient Details Name: Isaac [name] MRN: [id number] DOB: Feb 19, 1981 Today's Date: 02/06/2020 Time: 1275-1700 SLP Time Calculation (min) (ACUTE ONLY): 21 min  Assessment / Plan / Recommendation Clinical Impression  Cognitive linguistic therapy provided today during co-treatment with PT. Pt followed one-step functional commands with <25% accuracy, however, note Pt's state of mind was not optimal this morning with a high level of agitation. Pt demonstrated behaviors such as crying and then yelling at SLP, PT and NT during session. Pt seemed to comprehend some instructions however his level of frustration and agitation negatively impacted his ability/desire to follow commands. Pt was presented with a phone with a picture of new shoes which initially elicited positive reaction and some speech however, he quickly go frurstrated pushing the phone back to SLP and saying "NO MAN". Pt perseverated on phrase "No, man, NO!" throughout session with automatic phrases throughout containing expletives. Pt did eventually follow some commands however session was eventually terminated secondary to Pt's level of frustration. 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 He had a G-tube placed on 10/16/2019 was discharged from [facility] on 10/18/2019. He has been receiving  PT/OT as an outpatient, but I cannot find any SLP records. BSE requested.      SLP Plan  Continue with current plan of care                       SLP Visit Diagnosis: Cognitive communication deficit (R41.841);Frontal lobe and executive function deficit;Attention and concentration deficit Attention and concentration deficit following: Cerebral infarction;Other cerebrovascular disease Frontal lobe and executive function deficit following: Cerebral infarction Plan: Continue with current plan of care       Derwin Reddy H. Romie Levee, CCC-SLP Speech Language Pathologist       Georgetta Haber 02/06/2020, 12:55 PM

## 2020-02-06 NOTE — TOC Progression Note (Signed)
Transition of Care [facility]) - Progression Note    Patient Details  Name: Isaac [name] MRN: [id number] Date of Birth: [birth year]  [facility] CM/SW Contact  Barry Brunner, LCSW Phone Number: 02/06/2020, 12:28 PM  Clinical Narrative:    CSW followed up with [facility] in [place] for SNF referral. Admissions Director reported that they will not be able to make a bed offer. TOC to follow.      Barriers to Discharge: No SNF bed  Expected Discharge Plan and Services           Expected Discharge Date: 01/26/20                                     Social Determinants of Health (SDOH) Interventions    Readmission Risk Interventions No flowsheet data found.

## 2020-02-06 NOTE — Plan of Care (Signed)

## 2020-02-06 NOTE — Progress Notes (Signed)
Physical Therapy Treatment Patient Details Name: Isaac [name] MRN: [id number] DOB: 03-01-1981 Today's Date: 02/06/2020    History of Present Illness Isaac [name] is a 39 y.o. male with medical history significant of history of bacterial meningitis about 3 months ago, NSTEMI due to demand ischemia during his meningitis hospitalization (see EDP note for attached discharge summary) who is brought to the emergency department by family members due to the patient's worsening mental status.  The patient's mental status has been progressively worse.  He was seen at [facility] about 4 days ago and given Geodon due to restlessness, but subsequently discharged home.  His mother has been having a very difficult time taking care of him at home.  Neurology has been trying to assist in long-term placement, but the patient is uninsured and his mother is unable to afford it.  Neurology recently discontinue Keppra and started on Lamictal to control his agitation.  He has also been given Seroquel without significant results.    PT Comments    Patient easily agitated and requires Max verbal/tactile cueing to participate with therapy, had near fall when attempting transfer to chair without assistance requiring Mod assist to prevent sliding out of chair.  Patient tolerated sitting up in chair and later became more agreeable for functional activity when needing to be cleaned, tolerated standing with RW for up to 7-8 minutes with Mod 2 person assist while having depends changed, then able to take a few steps at bedside to transfer back to bed.  Overall patient appears to move cooperative when his fiancee is present and left in bed with his fiancee present in room - nursing staff aware.  Patient will benefit from continued physical therapy in hospital and recommended venue below to increase strength, balance, endurance for safe ADLs and gait.   Follow Up Recommendations  SNF     Equipment Recommendations  Rolling  walker with 5" wheels    Recommendations for Other Services       Precautions / Restrictions Precautions Precautions: Fall Restrictions Weight Bearing Restrictions: No    Mobility  Bed Mobility Overal bed mobility: Needs Assistance Bed Mobility: Supine to Sit;Sit to Supine     Supine to sit: Mod assist Sit to supine: Mod assist   General bed mobility comments: slow labored movement requiring assistance to move BLE  Transfers Overall transfer level: Needs assistance Equipment used: Rolling walker (2 wheeled);2 person hand held assist Transfers: Sit to/from [employer] Sit to Stand: Mod assist;+2 physical assistance Stand pivot transfers: Mod assist;+2 physical assistance       General transfer comment: frequent buckling of knees due to weakness, constant verbal/tactile cueing for safety  Ambulation/Gait Ambulation/Gait assistance: Mod assist;Max assist;+2 physical assistance Gait Distance (Feet): 4 Feet Assistive device: Rolling walker (2 wheeled);2 person hand held assist Gait Pattern/deviations: Decreased step length - right;Decreased step length - left;Decreased stride length Gait velocity: decreased   General Gait Details: limited to 4-5 slow labored usteady steps with frequent buckling of knees due to weakness   Stairs             Wheelchair Mobility    Modified Rankin (Stroke Patients Only)       Balance Overall balance assessment: Needs assistance Sitting-balance support: Feet supported;No upper extremity supported Sitting balance-Leahy Scale: Fair Sitting balance - Comments: fair/good seated at EOB   Standing balance support: During functional activity;Bilateral upper extremity supported Standing balance-Leahy Scale: Poor Standing balance comment: using RW with 2 person hand held assist  Cognition Arousal/Alertness: Awake/alert Behavior During Therapy:  Impulsive;Restless;Agitated Overall Cognitive Status: History of cognitive impairments - at baseline                                        Exercises      General Comments        Pertinent Vitals/Pain Pain Assessment: No/denies pain    Home Living                      Prior Function            PT Goals (current goals can now be found in the care plan section) Acute Rehab PT Goals Patient Stated Goal: not stated PT Goal Formulation: With patient Time For Goal Achievement: 02/20/20 Potential to Achieve Goals: Fair Progress towards PT goals: Progressing toward goals    Frequency    Min 4X/week      PT Plan Current plan remains appropriate    Co-evaluation     PT goals addressed during session: Proper use of DME;Mobility/safety with mobility        AM-PAC PT "6 Clicks" Mobility   Outcome Measure  Help needed turning from your back to your side while in a flat bed without using bedrails?: A Little Help needed moving from lying on your back to sitting on the side of a flat bed without using bedrails?: A Lot Help needed moving to and from a bed to a chair (including a wheelchair)?: A Lot Help needed standing up from a chair using your arms (e.g., wheelchair or bedside chair)?: A Lot Help needed to walk in hospital room?: A Lot Help needed climbing 3-5 steps with a railing? : Total 6 Click Score: 12    End of Session   Activity Tolerance: Patient tolerated treatment well;Patient limited by fatigue;Treatment limited secondary to agitation Patient left: in chair;with call bell/phone within reach;with chair alarm set Nurse Communication: Mobility status PT Visit Diagnosis: Unsteadiness on feet (R26.81);Other abnormalities of gait and mobility (R26.89);Muscle weakness (generalized) (M62.81)     Time: 1000-1035 PT Time Calculation (min) (ACUTE ONLY): 35 min  Charges:  $Therapeutic Activity: 23-37 mins                     2:10 PM,  02/06/20 Ocie Bob, MPT Physical Therapist with [facility] 336 [phone] office [phone] mobile phone

## 2020-02-07 DIAGNOSIS — N390 Urinary tract infection, site not specified: Secondary | ICD-10-CM | POA: Diagnosis not present

## 2020-02-07 DIAGNOSIS — R451 Restlessness and agitation: Secondary | ICD-10-CM | POA: Diagnosis not present

## 2020-02-07 DIAGNOSIS — R9431 Abnormal electrocardiogram [ECG] [EKG]: Secondary | ICD-10-CM | POA: Diagnosis not present

## 2020-02-07 DIAGNOSIS — F1410 Cocaine abuse, uncomplicated: Secondary | ICD-10-CM | POA: Clinically undetermined

## 2020-02-07 DIAGNOSIS — N3001 Acute cystitis with hematuria: Secondary | ICD-10-CM | POA: Diagnosis not present

## 2020-02-07 MED ORDER — OXCARBAZEPINE 150 MG PO TABS
150.0000 mg | ORAL_TABLET | Freq: Two times a day (BID) | ORAL | Status: DC
  Administered 2020-02-07 – 2020-02-23 (×32): 150 mg via ORAL
  Filled 2020-02-07 (×40): qty 1

## 2020-02-07 NOTE — Progress Notes (Signed)
[facility] night shift.  The staff asked me to place a consult order for TTS at the request of behavioral health who has discussed the case earlier with Dr. Laural Benes in regards to the patient's agitation and medication management.  Consult order has been placed.  Sanda Klein, MD

## 2020-02-07 NOTE — Progress Notes (Signed)
Patient resting in chair at this time. Eye closed breathing is even and nonlabored. Chair alarm placed.

## 2020-02-07 NOTE — Progress Notes (Signed)
Physical Therapy Treatment Patient Details Name: Isaac [name] MRN: [id number] DOB: 03-Mar-1981 Today's Date: 02/07/2020    History of Present Illness Isaac [name] is a 39 y.o. male with medical history significant of history of bacterial meningitis about 3 months ago, NSTEMI due to demand ischemia during his meningitis hospitalization (see EDP note for attached discharge summary) who is brought to the emergency department by family members due to the patient's worsening mental status.  The patient's mental status has been progressively worse.  He was seen at [facility] about 4 days ago and given Geodon due to restlessness, but subsequently discharged home.  His mother has been having a very difficult time taking care of him at home.  Neurology has been trying to assist in long-term placement, but the patient is uninsured and his mother is unable to afford it.  Neurology recently discontinue Keppra and started on Lamictal to control his agitation.  He has also been given Seroquel without significant results.    PT Comments    Patient presents up in chair (assisted by nursing staff) and agreeable for therapy.  Patient demonstrates increased endurance/distance for ambulation in room/hallway, followed with wheelchair for safety, required sitting rest break before walking back to room, occasional scissoring of legs with narrow base of support without loss of balance, once fatigued knees start to buckling and patient tolerated sitting up in chair after therapy.  Patient required 1 person assist for most activities, occasional 2 person assist for completing sit to stands once fatigued.  Patient cooperative with no combative behavior throughout visit.   Patient will benefit from continued physical therapy in hospital and recommended venue below to increase strength, balance, endurance for safe ADLs and gait.   Follow Up Recommendations  SNF     Equipment Recommendations  Rolling walker with 5" wheels     Recommendations for Other Services       Precautions / Restrictions Precautions Precautions: Fall Restrictions Weight Bearing Restrictions: No    Mobility  Bed Mobility               General bed mobility comments: Presents seated in chair (assisted by nursing staff)  Transfers Overall transfer level: Needs assistance Equipment used: Rolling walker (2 wheeled) Transfers: Sit to/from [employer] Transfers   Stand pivot transfers: Min assist;Mod assist Squat pivot transfers: Min assist;Mod assist     General transfer comment: occasional buckling of knees without loss of balance  Ambulation/Gait Ambulation/Gait assistance: Min assist;Mod assist Gait Distance (Feet): 50 Feet Assistive device: Rolling walker (2 wheeled) Gait Pattern/deviations: Decreased step length - right;Decreased step length - left;Decreased stride length;Scissoring;Narrow base of support Gait velocity: decreased   General Gait Details: demonstrates increased endurance/distance for ambulation in room/hallway with occasional scissoring of legs without loss of balance, followed with wheelchair for safety, limited secondary to fatigue, tolerated 2 trials after resting in wheelchair   Stairs             Wheelchair Mobility    Modified Rankin (Stroke Patients Only)       Balance Overall balance assessment: Needs assistance Sitting-balance support: Feet supported;No upper extremity supported Sitting balance-Leahy Scale: Fair Sitting balance - Comments: fair/good seated in chair   Standing balance support: During functional activity;Bilateral upper extremity supported Standing balance-Leahy Scale: Fair Standing balance comment: using RW                            Cognition Arousal/Alertness: Awake/alert Behavior During  Therapy: WFL for tasks assessed/performed Overall Cognitive Status: History of cognitive impairments - at baseline                                         Exercises      General Comments        Pertinent Vitals/Pain Pain Assessment: No/denies pain    Home Living                      Prior Function            PT Goals (current goals can now be found in the care plan section) Acute Rehab PT Goals Patient Stated Goal: return home PT Goal Formulation: With patient Time For Goal Achievement: 02/20/20 Potential to Achieve Goals: Good Progress towards PT goals: Progressing toward goals    Frequency    Min 4X/week      PT Plan Current plan remains appropriate    Co-evaluation              AM-PAC PT "6 Clicks" Mobility   Outcome Measure  Help needed turning from your back to your side while in a flat bed without using bedrails?: A Little Help needed moving from lying on your back to sitting on the side of a flat bed without using bedrails?: A Little Help needed moving to and from a bed to a chair (including a wheelchair)?: A Lot Help needed standing up from a chair using your arms (e.g., wheelchair or bedside chair)?: A Lot Help needed to walk in hospital room?: A Lot Help needed climbing 3-5 steps with a railing? : Total 6 Click Score: 13    End of Session Equipment Utilized During Treatment: Gait belt Activity Tolerance: Patient tolerated treatment well;Patient limited by fatigue Patient left: in chair;with call bell/phone within reach;with chair alarm set Nurse Communication: Mobility status PT Visit Diagnosis: Unsteadiness on feet (R26.81);Other abnormalities of gait and mobility (R26.89);Muscle weakness (generalized) (M62.81)     Time: 7253-6644 PT Time Calculation (min) (ACUTE ONLY): 31 min  Charges:  $Gait Training: 8-22 mins $Therapeutic Activity: 8-22 mins                     10:11 AM, 02/07/20 Ocie Bob, MPT Physical Therapist with [facility] 336 [phone] office [phone] mobile phone
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
## 2020-02-07 NOTE — Progress Notes (Signed)
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 Hepresented to the [facility] ED on 09/28/2019 with altered mental status and fever of 104 F, WBC 17.4 and lactate 4.7 after a day of generalized malaise, nausea, vomiting, weaknessand headache. He had elevated troponins over 5000.Due to combativeness, he required sedation and subsequent intubation [facility] and [facility] with pneumococcal [facility]. He was started on ceftriaxone. Due to possible seizure, he was started on Keppra and placed on continuous EEG which was negative for seizure activity. 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 He received a 5 day course of SoluMedrol for cerebral edema. He then developed exteensor posturing with tachycardia and hypertension concerning for elevated intracranial pressure. He underwent Bolt ICP monitoring which demonstrated no elevated ICP. Follow up CT head and subsequent repeat MRI of brain showed small subacute hemorrhage in the right suboccipital lobe, either hemorrhagic conversion of small ischemic stroke vs extension of previously seen microhemorrhage, as well as hypoxic injury involving the bilateral caudate and putamen. He also sustained a NSTEMI. He was extubated on 10/10/2019 and underwent slow taper of Dilaudid to minimize withdrawal symptoms. A G-tube was inserted on 10/16/2019 and he was discharged on 10/18/2019 under the care of his mother who is a CNA.   He was brought to hospital with altered mental status.   Subjective   Patient had a brief outburst of severe agitation overnight.   Assessment/Plan:   1. 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  He continues to have intermittent outbursts of agitation.  Working closely with TTS behavioral health for medication management and stabilization. I have requested a new TTS consult and evaluation on 12/4 for medication management.  For now, continue  Lamictal 100 mg daily. Continue diazepam BID, trileptal BID and seroquel BID. Haldol for agitation as needed. He was off restraints since 01/11/2020. 2. Agitation-Improving and stabilizing.  Haldol and Ativan HELPING. TTS was re-consulted for assistance 12/4.  For now continue Seroquel to 300 mg p.o. every morning and 400 mg p.o. every evening.  Further recommendations to follow after TTS eval. 3. 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  Medications adjusted as above. 4. Dysphagia-seen by speech therapy, continue regular diet.  PEG tube removed on 01/19/2020.  Continue ongoing speech therapy. 5. SVT/hypertension-patient had intermittent episodes of SVT, controlled and resolved with use of metoprolol. 6. Social issues-patient's mother is trained CNA and is no longer able to manage him at home.  Awaiting placement to facility.   COVID-19 Labs  No results for input(s): DDIMER, FERRITIN, LDH, CRP in the last 72 hours.  Lab Results  Component Value Date   SARSCOV2NAA NEGATIVE 01/01/2020     Scheduled medications:   . diazepam  5 mg Oral Q12H  . feeding supplement  237 mL Oral BID BM  . ferrous sulfate  325 mg Oral Q breakfast  . folic acid  1 mg Oral Daily  . lamoTRIgine  100 mg Oral Daily  . metoprolol tartrate  25 mg Oral BID  . OXcarbazepine  150 mg Oral BID  . polyethylene glycol  17 g Oral Daily  . QUEtiapine  300 mg Oral q AM  .  QUEtiapine  400 mg Oral QHS  . rivaroxaban  10 mg Oral Q supper  . senna-docusate  2 tablet Oral BID  . thiamine  100 mg Oral Daily   Antibiotics: Anti-infectives (From  admission, onward)   Start     Dose/Rate Route Frequency Ordered Stop   01/07/20 1800  cefTRIAXone (ROCEPHIN) 1 g in sodium chloride 0.9 % 100 mL IVPB  Status:  Discontinued        1 g 200 mL/hr over 30 Minutes Intravenous Every 24 hours 01/07/20 1710 01/07/20 1714   01/07/20 1800  cefdinir (OMNICEF) capsule 300 mg  Status:  Discontinued        300 mg Oral Every 12 hours 01/07/20 1714 01/14/20 0752   01/02/20 2200  cefTRIAXone (ROCEPHIN) 1 g in sodium chloride 0.9 % 100 mL IVPB  Status:  Discontinued        1 g 200 mL/hr over 30 Minutes Intravenous Every 24 hours 01/01/20 2228 01/06/20 0854   01/01/20 2200  cefTRIAXone (ROCEPHIN) 1 g in sodium chloride 0.9 % 100 mL IVPB        1 g 200 mL/hr over 30 Minutes Intravenous  Once 01/01/20 2149 01/02/20 0026     DVT prophylaxis: Lovenox  Code Status: Full code  Family Communication: family at bedside updated 12/2   Consultants: TTS  Objective   Vitals:   02/06/20 2100 02/07/20 0333 02/07/20 0631 02/07/20 0955  BP: 119/70  133/89 134/82  Pulse: 98  85 95  Resp: 19  19 20   Temp: 98.8 F (37.1 C)  98.2 F (36.8 C)   TempSrc:      SpO2: 98%  100%   Weight:  90.2 kg    Height:       No intake or output data in the 24 hours ending 02/07/20 1346  12/02 1901 - 12/04 0700 In: 240 [P.O.:240] Out: -   Filed Weights   02/04/20 0500 02/06/20 0500 02/07/20 0333  Weight: 90 kg 90.1 kg 90.2 kg    Physical Examination:  General-awake, alert, calm, appears in no acute distress Heart-S1-S2, regular, no murmur auscultated Lungs-clear to auscultation bilaterally, no wheezing or crackles auscultated Abdomen-soft, nontender, no organomegaly Skin-multiple tattoos seen.  Extremities-no edema in the lower extremities Neuro-alert, oriented to self only  Status is: Inpatient  Dispo: The patient is from: Home              Anticipated d/c is to: Skilled nursing facility              Anticipated d/c date is: 02/08/20              Patient  currently medically stable for discharge  Barrier to discharge- awaiting bed at skilled nursing facility, difficult placement due to past behavioral issues.    14/5/21 MD   [facility] If 7PM-7AM, please contact night-coverage at [email or web address], Office  [phone]  02/07/2020, 1:46 PM  LOS: 37 days
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
## 2020-02-07 NOTE — Progress Notes (Signed)
Patient became agitated, yelling and cursing at staff and his mother. Patient became tearful.  Patients mother asked if he could have something to help calm him down. This nurse gave prn haldol IM refer to mar for dosage. Patients mother left the room. Patient in chair, chair alarm placed. Will continue to assess throughout shift.

## 2020-02-08 LAB — COMPREHENSIVE METABOLIC PANEL
ALT: 23 U/L (ref 0–44)
AST: 18 U/L (ref 15–41)
Albumin: 3.6 g/dL (ref 3.5–5.0)
Alkaline Phosphatase: 56 U/L (ref 38–126)
Anion gap: 9 (ref 5–15)
BUN: 11 mg/dL (ref 6–20)
CO2: 29 mmol/L (ref 22–32)
Calcium: 9.1 mg/dL (ref 8.9–10.3)
Chloride: 100 mmol/L (ref 98–111)
Creatinine, Ser: 0.63 mg/dL (ref 0.61–1.24)
GFR, Estimated: >60 mL/min (ref >60)
Glucose, Bld: 116 mg/dL — ABNORMAL HIGH (ref 70–99)
Potassium: 3.8 mmol/L (ref 3.5–5.1)
Sodium: 138 mmol/L (ref 135–145)
Total Bilirubin: 0.4 mg/dL (ref 0.3–1.2)
Total Protein: 6.5 g/dL (ref 6.5–8.1)

## 2020-02-08 LAB — CBC
HCT: 34.1 % — ABNORMAL LOW (ref 39.0–52.0)
Hemoglobin: 10.9 g/dL — ABNORMAL LOW (ref 13.0–17.0)
MCH: 30.1 pg (ref 26.0–34.0)
MCHC: 32 g/dL (ref 30.0–36.0)
MCV: 94.2 fL (ref 80.0–100.0)
Platelets: 359 10*3/uL (ref 150–400)
RBC: 3.62 MIL/uL — ABNORMAL LOW (ref 4.22–5.81)
RDW: 14.6 % (ref 11.5–15.5)
WBC: 5.2 10*3/uL (ref 4.0–10.5)
nRBC: 0 % (ref 0.0–0.2)

## 2020-02-08 LAB — GLUCOSE, CAPILLARY: Glucose-Capillary: 108 mg/dL — ABNORMAL HIGH (ref 70–99)

## 2020-02-08 NOTE — Progress Notes (Signed)
Patient meets criteria for inpatient treatment per Dr. Lucianne Muss. No appropriate beds at [facility] currently. CSW faxed referrals to the following facilities for review:   [facility]    TTS will continue to seek bed placement.    Stephannie Peters, MSW, LCSW Licensed Visual merchandiser - PRN (Transition of Care Team) [facility]  [facility]   02/08/2020 3:03 PM

## 2020-02-08 NOTE — BH Assessment (Addendum)
Re-assessment 02/08/2020:   Isaac [name] is a 39 y.o. male patient admitted with altered mental status. Patient admitted to [facility] on 01/01/2020-present (38 days).  This assessment was attempted via tele-medicine. Patient initially presented calm and was laying in hospital bed. He appeared to be sleeping. His nurse waked him up and he easily became arousable. Clinician observed patient raising bed which appear to assist in patient's arousal for participation in today's reassessments.  Patient was not alert and/or oriented to time, place, and/or situation. He was observed looking around the room in a confused stated.  Patient stating that he felt fine today. He was unable to answer most questions. Also, difficult to understand due to garbled speech. Consulted with nursing whom stated that patient has been calm and cooperative today. No reported aggressive and/or assaultive behaviors.  However, notes in EPIC indicate that he has displayed violent outburst. The last noted violent outburst was 02/07/2020. Nursing at bed side stated that he is eating well and getting plenty of sleep/rest.   Upon chart review and providers documentation: "Per patient's mother, "[name]" is a 89 year old father of 4 daughters and 1 son who lived independently and had no prior history of behavioral or mental health issues until 07/25 patient came home from work not feeling well and the next day he was being airlifted to [facility]". Mom states patient was positive for meningitis and had a "heart attack and stroke" during transport to [facility] leaving him in a coma. Patient was discharged to his mother's care 10/18/19.". He presents to [facility] 38 days ago and mother stated that she is no longer able to care for him due to behaviors.   TTS reassessment completed and disposition discussed with Tressa Busman, NP. Whom further discussed recommendations with Dr. Lucianne Muss.  As per Dr. Lucianne Muss, patient to be referred to [facility] by Disposition  Social Worker. Providers will continue to provide medication recommendations during his time [facility]. Also, TTS to continue daily re-assessments.

## 2020-02-08 NOTE — Progress Notes (Signed)
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 Hepresented to the [facility] ED on 09/28/2019 with altered mental status and fever of 104 F, WBC 17.4 and lactate 4.7 after a day of generalized malaise, nausea, vomiting, weaknessand headache. He had elevated troponins over 5000.Due to combativeness, he required sedation and subsequent intubation [facility] with [facility]. He was started on ceftriaxone. Due to possible seizure, he was started on Keppra and placed on continuous EEG which was negative for seizure activity. 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 He received a 5 day course of SoluMedrol for cerebral edema. He then developed exteensor posturing with tachycardia and hypertension concerning for elevated intracranial pressure. He underwent Bolt ICP monitoring which demonstrated no elevated ICP. Follow up CT head and subsequent repeat MRI of brain showed small subacute hemorrhage in the right suboccipital lobe, either hemorrhagic conversion of small ischemic stroke vs extension of previously seen microhemorrhage, as well as hypoxic injury involving the bilateral caudate and putamen. He also sustained a NSTEMI. He was extubated on 10/10/2019 and underwent slow taper of Dilaudid to minimize withdrawal symptoms. A G-tube was inserted on 10/16/2019 and he was discharged on 10/18/2019 under the care of his mother who is a CNA.   He was brought to hospital with altered mental status.  Assessment/Plan: Acuteon chronicmetabolic encephalopathy--- worsening behavioral problems superimposed on underlying anoxic brain  injury -Abilify 5 mg twice daily discontinued 02/03/20 -Continue Lamictal 100 mg daily -Keppra was recently discontinued as patient's EEG was nonepileptiformand may have been contributing to increased agitation --Lorazepam or Haldol as needed agitation -Previously  discussed the risks/benefits of seroquel including its "black box warning" with patient's mother  -she expresses understanding in light of the patient's behavioral issues -She expresses that other medications and change of venue/scenery have been tried to try to provide care for the patient -in light of her inability to care for the patient and failure of other modalities, she accepts the risks with seroquel in hopes that his behavior can be controlled to allow for care of the patient -- -continuevalium bid -11/30 increased seroquel to 300 mg am and 400 hs -11/6 overall less aggression but continues to have episodes of agitation -01/11/20--restraints off  -- 01/27/20 -overall now more re-directable and following commands; behaviors have improved significantly  after adjustment of medications -Patient remains medically stable, he is awaiting placement to appropriate facility as patient's mother who is a trained CNA is no longer able to manage him at home  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-- -patient with behavior,neurological and intellectual deficits--medication adjustments as above   Dysphagia/FEN -Seen by speech therapy eval appreciated, continue regular diet -continues to eat well-- eating close to 100% meals -PEG tube removed 01/19/2020 -Ongoing speech therapy appreciated  SVT/HTN -patient had intermittent episodes of SVT -controlled and resolved with the use of metoprolol -02/08/20-had episode of SVT -increase metoprolol to 37.5 mg  bid  Social/Ethics -patient's mother was a trained CNA is no longer able to manage him at home due to escalating behaviors --Patient remains total  care- He does not talk much. He is still incontinent. He requires assistance with essential ADLs such as bathing, dressing and  using toilet -Familydoes not feel that they can safely manage the patient at home and arerequesting placement to facility. -Social work consultation to help with placement requested  chronic anemia/iron deficiency anemia -multifactorial , partly nutritional, expect further drop in H&H with hydration/hemodilution -iron sat--9% -folate 7.1 -B12--2310 -c/n  iron and folate -Repeat CBC requested for 12/28/2019  Left elbow pain -Patient with ongoing weakness fall out of bed on 2020-01-14 -No swelling, skin breakdown or fractures appreciated on images. -No further significant left nipple pain  Disposition/Need for in-Hospital Stay- patient unable to be discharged at this time due to -behavioral problems requiring placement to appropriate facility --At this time patient does not have a safe discharge plan, -Awaiting placement to appropriate facility as patient's mother who is a trained CNA is no longer able to manage him at home      Status is: Inpatient  Remains inpatient appropriate because:IV treatments appropriate due to intensity of illness or inability to take PO   Dispo: The patient is from: Home              Anticipated d/c is to: [facility]              Anticipated d/c date is: > 3 days              Patient currently is medically stable to d/c.        Family Communication:  no Family at bedside  Consultants:  psychiatry  Code Status:  FULL   DVT Prophylaxis:  xarelto   Procedures: As Listed in Progress Note Above  Antibiotics: None      Subjective: Patient denies fevers, chills, headache, chest pain, dyspnea, nausea, vomiting, diarrhea, abdominal pain, dysuria, hematuria, hematochezia, and melena.   Objective: Vitals:   02/08/20 0948 02/08/20 1007 02/08/20 1137 02/08/20 1400  BP: 135/70 129/80  100/61 105/72  Pulse: (!) 133 (!) 127 95 (!) 111  Resp:  18 17 20   Temp:  98.2 F (36.8 C)  98.4 F (36.9 C)  TempSrc:  Oral    SpO2:  100% 100% 100%  Weight:      Height:       No intake or output data in the 24 hours ending 02/08/20 1700 Weight change:  Exam:   General:  Pt is alert, follows commands appropriately, not in acute distress  HEENT: No icterus, No thrush, No neck mass, [place]/AT  Cardiovascular: RRR, S1/S2, no rubs, no gallops  Respiratory: CTA bilaterally, no wheezing, no crackles, no rhonchi  Abdomen: Soft/+BS, non tender, non distended, no guarding  Extremities: No edema, No lymphangitis, No petechiae, No rashes, no synovitis   Data Reviewed: I have personally reviewed following labs and imaging studies Basic Metabolic Panel: Recent Labs  Lab 02/08/20 1106  NA 138  K 3.8  CL 100  CO2 29  GLUCOSE 116*  BUN 11  CREATININE 0.63  CALCIUM 9.1   Liver Function Tests: Recent Labs  Lab 02/08/20 1106  AST 18  ALT 23  ALKPHOS 56  BILITOT 0.4  PROT 6.5  ALBUMIN 3.6   No results for input(s): LIPASE, AMYLASE in the last 168 hours. No results for input(s): AMMONIA in the last 168 hours. Coagulation Profile: No results for input(s): INR, PROTIME in the last 168 hours. CBC: Recent Labs  Lab 02/08/20 1106  WBC 5.2  HGB 10.9*  HCT 34.1*  MCV 94.2  PLT 359   Cardiac Enzymes: No results for input(s): CKTOTAL, CKMB, CKMBINDEX, TROPONINI in the  last 168 hours. BNP: Invalid input(s): POCBNP CBG: Recent Labs  Lab 02/08/20 1136  GLUCAP 108*   HbA1C: No results for input(s): HGBA1C in the last 72 hours. Urine analysis:    Component Value Date/Time   COLORURINE YELLOW 01/01/2020 2133   APPEARANCEUR HAZY (A) 01/01/2020 2133   LABSPEC 1.003 (L) 01/01/2020 2133   PHURINE 6.0 01/01/2020 2133   GLUCOSEU NEGATIVE 01/01/2020 2133   HGBUR LARGE (A) 01/01/2020 2133   BILIRUBINUR NEGATIVE 01/01/2020 2133   KETONESUR 20 (A) 01/01/2020 2133    PROTEINUR 30 (A) 01/01/2020 2133   NITRITE NEGATIVE 01/01/2020 2133   LEUKOCYTESUR LARGE (A) 01/01/2020 2133   Sepsis Labs: @LABRCNTIP (procalcitonin:4,lacticidven:4) )No results found for this or any previous visit (from the past 240 hour(s)).   Scheduled Meds: . diazepam  5 mg Oral Q12H  . feeding supplement  237 mL Oral BID BM  . ferrous sulfate  325 mg Oral Q breakfast  . folic acid  1 mg Oral Daily  . lamoTRIgine  100 mg Oral Daily  . metoprolol tartrate  25 mg Oral BID  . OXcarbazepine  150 mg Oral BID  . polyethylene glycol  17 g Oral Daily  . QUEtiapine  300 mg Oral q AM  . QUEtiapine  400 mg Oral QHS  . rivaroxaban  10 mg Oral Q supper  . senna-docusate  2 tablet Oral BID  . thiamine  100 mg Oral Daily   Continuous Infusions:  Procedures/Studies: DG Elbow 2 Views Left  Result Date: 01/14/2020 CLINICAL DATA:  Pain status post fall EXAM: LEFT ELBOW - 2 VIEW COMPARISON:  None. FINDINGS: There is no evidence of fracture, dislocation, or joint effusion. There is no evidence of arthropathy or other focal bone abnormality. Soft tissues are unremarkable. IMPRESSION: Negative. Electronically Signed   By: 13/12/2019 M.D.   On: 01/14/2020 19:31    13/12/2019, DO  [facility]  If 7PM-7AM, please contact night-coverage [email or web address] Password [email or web address] 02/08/2020, 5:00 PM   LOS: 38 days

## 2020-02-08 NOTE — Progress Notes (Signed)
°   02/08/20 0948  Assess: MEWS Score  BP 135/70  Pulse Rate (!) 133  Assess: MEWS Score  MEWS Temp 0  MEWS Systolic 0  MEWS Pulse 3  MEWS RR 0  MEWS LOC 0  MEWS Score 3  MEWS Score Color Yellow  Assess: if the MEWS score is Yellow or Red  Were vital signs taken at a resting state? Yes  Focused Assessment No change from prior assessment  Early Detection of Sepsis Score *See Row Information* Low  MEWS guidelines implemented *See Row Information* Yes  Treat  MEWS Interventions Other (Comment);Administered scheduled meds/treatments (MD made aware)  Pain Scale 0-10  Pain Score 0  Take Vital Signs  Increase Vital Sign Frequency  Yellow: Q 2hr X 2 then Q 4hr X 2, if remains yellow, continue Q 4hrs  Escalate  MEWS: Escalate Yellow: discuss with charge nurse/RN and consider discussing with provider and RRT  Notify: Charge Nurse/RN  Name of Charge Nurse/RN Notified Johnsie Cancel RN  Date Charge Nurse/RN Notified 02/08/20  Time Charge Nurse/RN Notified 0347  Notify: Provider  Provider Name/Title Catarina Hartshorn   Date Provider Notified 02/08/20  Time Provider Notified [phone]  Notification Type Page  Notification Reason Change in status (Heart Rate elevated)  Response See new orders  Date of Provider Response 02/08/20  Time of Provider Response [phone]

## 2020-02-09 MED ORDER — METOPROLOL TARTRATE 25 MG PO TABS
37.5000 mg | ORAL_TABLET | Freq: Two times a day (BID) | ORAL | Status: DC
  Administered 2020-02-09 – 2020-02-23 (×28): 37.5 mg via ORAL
  Filled 2020-02-09 (×29): qty 2

## 2020-02-09 NOTE — Progress Notes (Signed)
CSW contacted by Corrie Dandy, Admissions [facility], requesting provider notes of patient for the past 4 days.  CSW faxed information to [phone].  Patient still under review.  CSW to follow up.  Ladoris Gene MSW,LCSWA,LCASA Clinical Social Worker  [place] Disposition [phone] (cell)

## 2020-02-09 NOTE — Progress Notes (Signed)
Physical Therapy Treatment Patient Details Name: Isaac [name] MRN: [id number] DOB: 04/26/1980 Today's Date: 02/09/2020    History of Present Illness Isaac [name] is a 39 y.o. male with medical history significant of history of bacterial meningitis about 3 months ago, NSTEMI due to demand ischemia during his meningitis hospitalization (see EDP note for attached discharge summary) who is brought to the emergency department by family members due to the patient's worsening mental status.  The patient's mental status has been progressively worse.  He was seen at [facility] about 4 days ago and given Geodon due to restlessness, but subsequently discharged home.  His mother has been having a very difficult time taking care of him at home.  Neurology has been trying to assist in long-term placement, but the patient is uninsured and his mother is unable to afford it.  Neurology recently discontinue Keppra and started on Lamictal to control his agitation.  He has also been given Seroquel without significant results.    PT Comments    Patient presents alert and cooperative with therapy - his fiancee in room.  Patient requires occasional repeated verbal/tactile cueing to follow instructions with fair/good carry over, requires 2 person assist to complete sit to stands due to BLE weakness and poor return for pushing down on RW, frequent scissoring of LLE/narrow base of support during ambulation in hallway, no loss of balance, but knees buckle once patient is fatigued.  Patient required 3-4 minute sitting rest break before ambulating back to room and tolerated sitting up in chair after therapy with his fiancee in room - NT aware.  Patient will benefit from continued physical therapy in hospital and recommended venue below to increase strength, balance, endurance for safe ADLs and gait.   Follow Up Recommendations  SNF     Equipment Recommendations  Rolling walker with 5" wheels    Recommendations for Other  Services       Precautions / Restrictions Precautions Precautions: Fall Restrictions Weight Bearing Restrictions: No    Mobility  Bed Mobility Overal bed mobility: Needs Assistance Bed Mobility: Supine to Sit     Supine to sit: Mod assist     General bed mobility comments: increased time, labored movement  Transfers Overall transfer level: Needs assistance Equipment used: Rolling walker (2 wheeled)   Sit to Stand: Mod assist;+2 physical assistance Stand pivot transfers: Min assist;Mod assist       General transfer comment: requires 2 person assist for sit to stands due to BLE weakness and fair/poor carryover for pushing down on RW  Ambulation/Gait Ambulation/Gait assistance: Min assist;Mod assist Gait Distance (Feet): 55 Feet Assistive device: Rolling walker (2 wheeled) Gait Pattern/deviations: Decreased step length - right;Decreased step length - left;Decreased stride length;Scissoring;Narrow base of support Gait velocity: decreased   General Gait Details: labored cadence with frequent scissoring of LLE with narrow base of support, requires occasional verbal/tactile to slow cadence with fair/good carryover, once fatigued knees buckles, had to take sitting rest break before returning to room   Stairs             Wheelchair Mobility    Modified Rankin (Stroke Patients Only)       Balance Overall balance assessment: Needs assistance Sitting-balance support: Feet supported;No upper extremity supported Sitting balance-Leahy Scale: Fair Sitting balance - Comments: fair/good seated at EOB   Standing balance support: During functional activity;Bilateral upper extremity supported Standing balance-Leahy Scale: Fair Standing balance comment: using RW  Cognition Arousal/Alertness: Awake/alert Behavior During Therapy: WFL for tasks assessed/performed Overall Cognitive Status: History of cognitive impairments - at  baseline                                        Exercises General Exercises - Lower Extremity Long Arc Quad: Seated;AAROM;Strengthening;Both;10 reps    General Comments        Pertinent Vitals/Pain Pain Assessment: No/denies pain    Home Living                      Prior Function            PT Goals (current goals can now be found in the care plan section) Acute Rehab PT Goals Patient Stated Goal: return home PT Goal Formulation: With patient Time For Goal Achievement: 02/20/20 Potential to Achieve Goals: Good Progress towards PT goals: Progressing toward goals    Frequency    Min 4X/week      PT Plan Current plan remains appropriate    Co-evaluation              AM-PAC PT "6 Clicks" Mobility   Outcome Measure  Help needed turning from your back to your side while in a flat bed without using bedrails?: A Little Help needed moving from lying on your back to sitting on the side of a flat bed without using bedrails?: A Lot Help needed moving to and from a bed to a chair (including a wheelchair)?: A Lot Help needed standing up from a chair using your arms (e.g., wheelchair or bedside chair)?: A Lot Help needed to walk in hospital room?: A Lot Help needed climbing 3-5 steps with a railing? : Total 6 Click Score: 12    End of Session Equipment Utilized During Treatment: Gait belt Activity Tolerance: Patient tolerated treatment well;Patient limited by fatigue Patient left: in chair;with call bell/phone within reach;with chair alarm set;with family/visitor present Nurse Communication: Mobility status PT Visit Diagnosis: Unsteadiness on feet (R26.81);Other abnormalities of gait and mobility (R26.89);Muscle weakness (generalized) (M62.81)     Time: 0623-7628 PT Time Calculation (min) (ACUTE ONLY): 22 min  Charges:  $Gait Training: 8-22 mins $Therapeutic Activity: 8-22 mins                     3:15 PM, 02/09/20 Ocie Bob,  MPT Physical Therapist with [facility] [phone] office [phone] mobile phone

## 2020-02-09 NOTE — TOC Progression Note (Signed)
Transition of Care [facility]) - Progression Note    Patient Details  Name: Isaac [name] MRN: [id number] Date of Birth: [birth year]  Transition of Care [facility]) CM/SW Contact  Karn Cassis, [place] Phone Number: 02/09/2020, 1:47 PM  Clinical Narrative:  LCSW noted pt referred to [facility]. Supervisor aware. TOC will continue to follow.        Barriers to Discharge: No SNF bed, Psych Bed not available  Expected Discharge Plan and Services           Expected Discharge Date: 01/26/20                                     Social Determinants of Health (SDOH) Interventions    Readmission Risk Interventions No flowsheet data found.

## 2020-02-09 NOTE — Progress Notes (Addendum)
CSW contacted admissions at [facility] [phone]) and completed the phone interview for pt, as the referral packet has been received. Pt's referral request is currently being reviewed and Disposition will receive a call back from [facility] today about their decision.    Wells Guiles, LCSW, LCAS Clinical Social Worker II Disposition CSW [phone]

## 2020-02-09 NOTE — Progress Notes (Signed)
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 Hepresented to the [facility] ED on 09/28/2019 with altered mental status and fever of 104 F, WBC 17.4 and lactate 4.7 after a day of generalized malaise, nausea, vomiting, weaknessand headache. He had elevated troponins over 5000.Due to combativeness, he required sedation and subsequent intubation [facility] and diagnosed with pneumococcal meningitis. He was started on ceftriaxone. Due to possible seizure, he was started on Keppra and placed on continuous EEG which was negative for seizure activity. 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 He received a 5 day course of SoluMedrol for cerebral edema. He then developed exteensor posturing with tachycardia and hypertension concerning for elevated intracranial pressure. He underwent Bolt ICP monitoring which demonstrated no elevated ICP. Follow up CT head and subsequent repeat MRI of brain showed small subacute hemorrhage in the right suboccipital lobe, either hemorrhagic conversion of small ischemic stroke vs extension of previously seen microhemorrhage, as well as hypoxic injury involving the bilateral caudate and putamen. He also sustained a NSTEMI. He was extubated on 10/10/2019 and underwent slow taper of Dilaudid to minimize withdrawal symptoms. A G-tube was inserted on 10/16/2019 and he was discharged on 10/18/2019 under the care of his mother who is a CNA.   He was brought to hospital with altered mental status.  Assessment/Plan: Acuteon chronicmetabolic encephalopathy--- worsening behavioral problems superimposed on underlying anoxic brain  injury -Abilify 5 mg twice daily discontinued 02/03/20 -Continue Lamictal 100 mg daily -Keppra was recently discontinued as patient's EEG was nonepileptiformand may have been contributing to increased agitation --Lorazepam or Haldol as needed agitation -Previously discussed the risks/benefits of seroquel including its "black box warning" with patient's mother  -she expresses understanding in light of the patient's behavioral issues -She expresses that other medications and change of venue/scenery have been tried to try to provide care for the patient -in light of her inability to care for the patient and failure of other modalities, she accepts the risks with seroquel in hopes that his behavior can be controlled to allow for care of the patient -- -continuevalium bid -11/30 increased seroquel to 300 mg am and 400 hs -11/6 overall less aggression but continues to have episodes of agitation -01/11/20--restraints off  -02/09/20--no aggressive behaviors in past 72 hours  -overall now more re-directable and following commands; behaviors have improved significantly after adjustment of medications -Patient remains medically stable, he is awaiting placement to appropriate facility as patient's mother who is a trained CNA is no longer able to manage him at home  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-- -patient with behavior,neurological and intellectual deficits--medication adjustments as above   Dysphagia/FEN -Seen by speech therapyeval appreciated, continue regular diet -continues to eat well--eatingclose to 100% meals -PEG tube removed 01/19/2020 -Ongoing speech therapy appreciated -eating well since PEG removed  SVT/HTN -patient had intermittent episodes of SVT -controlled and resolved with the use of metoprolol -02/08/20-had episode of SVT -increase metoprolol to 37.5 mg  bid  Social/Ethics -patient's mother was a trained CNA is no longer able to  manage him at home due to escalating behaviors --Patient remains total care- He does not talk much. He is still incontinent. He requires assistance with essential  ADLs such as bathing, dressing and using toilet -Familydoes not feel that they can safely manage the patient at home and arerequesting placement to facility. -Social work consultation to help with placement requested  chronic anemia/iron deficiency anemia -multifactorial , partly nutritional, expect further drop in H&H with hydration/hemodilution -iron sat--9% -folate 7.1 -B12--2310 -c/n iron and folate -Repeat CBC requested for 12/28/2019  Left elbow pain -Patient with ongoing weakness fall out of bed on 2020-01-14 -No swelling, skin breakdown or fractures appreciated on images. -No further significant left nipple pain  Disposition/Need for in-Hospital Stay- patient unable to be discharged at this time due to -behavioral problems requiring placement to appropriate facility --At this time patient does not have a safe discharge plan, -Awaiting placement to appropriate facility as patient's mother who is a trained CNA is no longer able to manage him at home      Status is: Inpatient  Remains inpatient appropriate because:IV treatments appropriate due to intensity of illness or inability to take PO   Dispo: The patient is from: Home  Anticipated d/c is to: [facility]  Anticipated d/c date is: > 3 days  Patient currently is medically stable to d/c.        Family Communication:  no Family at bedside  Consultants:  psychiatry  Code Status:  FULL   DVT Prophylaxis:  xarelto   Procedures: As Listed in Progress Note Above  Antibiotics: None    Subjective: Patient denies fevers, chills, headache, chest pain, dyspnea, nausea, vomiting, diarrhea, abdominal pain, dysuria, hematuria, hematochezia, and  melena.   Objective: Vitals:   02/09/20 0700 02/09/20 0908 02/09/20 1515 02/09/20 1656  BP: 122/68 132/78 113/76 129/79  Pulse: 72 90 90 100  Resp: 20  17 18   Temp: 98 F (36.7 C)  98 F (36.7 C) 98.6 F (37 C)  TempSrc:    Oral  SpO2: 98%  97% 100%  Weight:      Height:        Intake/Output Summary (Last 24 hours) at 02/09/2020 1820 Last data filed at 02/09/2020 1432 Gross per 24 hour  Intake 480 ml  Output 900 ml  Net -420 ml   Weight change:  Exam:   General:  Pt is alert, follows commands appropriately, not in acute distress  HEENT: No icterus, No thrush, No neck mass, [place]/AT  Cardiovascular: RRR, S1/S2, no rubs, no gallops  Respiratory: CTA bilaterally, no wheezing, no crackles, no rhonchi  Abdomen: Soft/+BS, non tender, non distended, no guarding  Extremities: No edema, No lymphangitis, No petechiae, No rashes, no synovitis   Data Reviewed: I have personally reviewed following labs and imaging studies Basic Metabolic Panel: Recent Labs  Lab 02/08/20 1106  NA 138  K 3.8  CL 100  CO2 29  GLUCOSE 116*  BUN 11  CREATININE 0.63  CALCIUM 9.1   Liver Function Tests: Recent Labs  Lab 02/08/20 1106  AST 18  ALT 23  ALKPHOS 56  BILITOT 0.4  PROT 6.5  ALBUMIN 3.6   No results for input(s): LIPASE, AMYLASE in the last 168 hours. No results for input(s): AMMONIA in the last 168 hours. Coagulation Profile: No results for input(s): INR, PROTIME in the last 168 hours. CBC: Recent Labs  Lab 02/08/20 1106  WBC 5.2  HGB 10.9*  HCT 34.1*  MCV 94.2  PLT 359   Cardiac Enzymes: No results for input(s): CKTOTAL, CKMB, CKMBINDEX, TROPONINI in the last 168 hours. BNP: Invalid input(s): POCBNP CBG: Recent Labs  Lab  02/08/20 1136  GLUCAP 108*   HbA1C: No results for input(s): HGBA1C in the last 72 hours. Urine analysis:    Component Value Date/Time   COLORURINE YELLOW 01/01/2020 2133   APPEARANCEUR HAZY (A) 01/01/2020 2133   LABSPEC 1.003 (L)  01/01/2020 2133   PHURINE 6.0 01/01/2020 2133   GLUCOSEU NEGATIVE 01/01/2020 2133   HGBUR LARGE (A) 01/01/2020 2133   BILIRUBINUR NEGATIVE 01/01/2020 2133   KETONESUR 20 (A) 01/01/2020 2133   PROTEINUR 30 (A) 01/01/2020 2133   NITRITE NEGATIVE 01/01/2020 2133   LEUKOCYTESUR LARGE (A) 01/01/2020 2133   Sepsis Labs: @LABRCNTIP (procalcitonin:4,lacticidven:4) )No results found for this or any previous visit (from the past 240 hour(s)).   Scheduled Meds: . diazepam  5 mg Oral Q12H  . feeding supplement  237 mL Oral BID BM  . ferrous sulfate  325 mg Oral Q breakfast  . folic acid  1 mg Oral Daily  . lamoTRIgine  100 mg Oral Daily  . metoprolol tartrate  25 mg Oral BID  . OXcarbazepine  150 mg Oral BID  . polyethylene glycol  17 g Oral Daily  . QUEtiapine  300 mg Oral q AM  . QUEtiapine  400 mg Oral QHS  . rivaroxaban  10 mg Oral Q supper  . senna-docusate  2 tablet Oral BID  . thiamine  100 mg Oral Daily   Continuous Infusions:  Procedures/Studies: DG Elbow 2 Views Left  Result Date: 01/14/2020 CLINICAL DATA:  Pain status post fall EXAM: LEFT ELBOW - 2 VIEW COMPARISON:  None. FINDINGS: There is no evidence of fracture, dislocation, or joint effusion. There is no evidence of arthropathy or other focal bone abnormality. Soft tissues are unremarkable. IMPRESSION: Negative. Electronically Signed   By: 13/12/2019 M.D.   On: 01/14/2020 19:31    13/12/2019, DO  [facility]  If 7PM-7AM, please contact night-coverage [email or web address] Password [email or web address] 02/09/2020, 6:20 PM   LOS: 39 days

## 2020-02-10 MED ORDER — OXCARBAZEPINE 300 MG PO TABS
ORAL_TABLET | ORAL | Status: AC
  Filled 2020-02-10: qty 1

## 2020-02-10 NOTE — Progress Notes (Signed)
PT Cancellation Note  Patient Details Name: Isaac [name] MRN: [id number] DOB: [birth year]   Cancelled Treatment:    Reason Eval/Treat Not Completed: Fatigue/lethargy limiting ability to participate.  Patient lethargic and declined therapy after encouragement.  Will check back tomorrow.   3:13 PM, 02/10/20 Ocie Bob, MPT Physical Therapist with [facility] [phone] office [phone] mobile phone

## 2020-02-10 NOTE — Progress Notes (Signed)
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 Hepresented to the [facility] ED on 09/28/2019 with altered mental status and fever of 104 F, WBC 17.4 and lactate 4.7 after a day of generalized malaise, nausea, vomiting, weaknessand headache. He had elevated troponins over 5000.Due to combativeness, he required sedation and subsequent intubation and [facility] and diagnosed with pneumococcal meningitis. He was started on ceftriaxone. Due to possible seizure, he was started on Keppra and placed on continuous EEG which was negative for seizure activity. 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 He received a 5 day course of SoluMedrol for cerebral edema. He then developed exteensor posturing with tachycardia and hypertension concerning for elevated intracranial pressure. He underwent Bolt ICP monitoring which demonstrated no elevated ICP. Follow up CT head and subsequent repeat MRI of brain showed small subacute hemorrhage in the right suboccipital lobe, either hemorrhagic conversion of small ischemic stroke vs extension of previously seen microhemorrhage, as well as hypoxic injury involving the bilateral caudate and putamen. He also sustained a NSTEMI. He was extubated on 10/10/2019 and underwent slow taper of Dilaudid to minimize withdrawal symptoms. A G-tube was inserted on 10/16/2019 and he was discharged on 10/18/2019 under the care of his mother who is a CNA.   He was brought to hospital with altered mental status.  Assessment/Plan: Acuteon chronicmetabolic encephalopathy--- worsening behavioral problems superimposed on underlying anoxic brain  injury -Abilify 5 mg twice dailydiscontinued 02/03/20 -Continue Lamictal 100 mg daily -Keppra was recently discontinued as patient's EEG was nonepileptiformand may have been contributing to increased agitation --Lorazepam or Haldol as needed agitation -Previously discussed the risks/benefits of seroquel including its "black box warning" with patient's mother  -she expresses understanding in light of the patient's behavioral issues -She expresses that other medications and change of venue/scenery have been tried to try to provide care for the patient -in light of her inability to care for the patient and failure of other modalities, she accepts the risks with seroquel in hopes that his behavior can be controlled to allow for care of the patient -- -continuevalium bid -11/30increased seroquel to 300 mg am and 400 hs -11/6 overall less aggression but continues to have episodes of agitation -01/11/20--restraints off  -02/09/20--no aggressive behaviors in past 72 hours  -overall now more re-directable and following commands; behaviors have improved significantly after adjustment of medications -Patient remains medically stable, he is awaiting placement to appropriate facility as patient's mother who is a trained CNA is no longer able to manage him at home  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-- -patient with behavior,neurological and intellectual deficits--medication adjustments as above   Dysphagia/FEN -Seen by speech therapyeval appreciated, continue regular diet -continues to eat well--eatingclose to 100% meals -PEG tube removed 01/19/2020 -Ongoing speech therapy appreciated -eating well since PEG removed  SVT/HTN -patient had intermittent episodes of SVT -controlled and resolved with the use of metoprolol -02/08/20-had episode of SVT -increase metoprolol to 37.5 mg bid  Social/Ethics -patient's mother was a trained CNA is no longer able to  manage him at home due to escalating behaviors --Patient remains total care- He does not talk much. He is still incontinent. He requires assistance with essential ADLs such as bathing,  dressing and using toilet -Familydoes not feel that they can safely manage the patient at home and arerequesting placement to facility. -Social work consultation to help with placement requested  chronic anemia/iron deficiency anemia -multifactorial , partly nutritional, expect further drop in H&H with hydration/hemodilution -iron sat--9% -folate 7.1 -B12--2310 -c/n iron and folate -Repeat CBC requested for 12/28/2019  Left elbow pain -Patient with ongoing weakness fall out of bed on 2020-01-14 -No swelling, skin breakdown or fractures appreciated on images. -No further significant left nipple pain  Disposition/Need for in-Hospital Stay- patient unable to be discharged at this time due to -behavioral problems requiring placement to appropriate facility --At this time patient does not have a safe discharge plan, -Awaiting placement to appropriate facility as patient's mother who is a trained CNA is no longer able to manage him at home      Status is: Inpatient  Remains inpatient appropriate because:IV treatments appropriate due to intensity of illness or inability to take PO   Dispo: The patient is from:Home Anticipated d/c is [facility] Anticipated d/c date is: > 3 days Patient currently is medically stable to d/c.        Family Communication:noFamily at bedside  Consultants:psychiatry  Code Status: FULL   DVT Prophylaxis:xarelto   Procedures: As Listed in Progress Note Above  Antibiotics: None      Subjective: Patient denies fevers, chills, headache, chest pain, dyspnea, nausea, vomiting, diarrhea, abdominal pain, dysuria, hematuria, hematochezia, and  melena.   Objective: Vitals:   02/09/20 2053 02/10/20 0426 02/10/20 0500 02/10/20 1342  BP: 110/60 118/80  114/73  Pulse: 94 88  98  Resp: 19 19  20   Temp: 98.2 F (36.8 C) 98.3 F (36.8 C)  98.2 F (36.8 C)  TempSrc:    Oral  SpO2: 98%   100%  Weight:   90.6 kg   Height:        Intake/Output Summary (Last 24 hours) at 02/10/2020 1757 Last data filed at 02/10/2020 1621 Gross per 24 hour  Intake 720 ml  Output 3100 ml  Net -2380 ml   Weight change:  Exam:   General:  Pt is alert, follows commands appropriately, not in acute distress  HEENT: No icterus, No thrush, No neck mass, [place]/AT  Cardiovascular: RRR, S1/S2, no rubs, no gallops  Respiratory: bibasilar rales. No wheeze  Abdomen: Soft/+BS, non tender, non distended, no guarding  Extremities: No edema, No lymphangitis, No petechiae, No rashes, no synovitis   Data Reviewed: I have personally reviewed following labs and imaging studies Basic Metabolic Panel: Recent Labs  Lab 02/08/20 1106  NA 138  K 3.8  CL 100  CO2 29  GLUCOSE 116*  BUN 11  CREATININE 0.63  CALCIUM 9.1   Liver Function Tests: Recent Labs  Lab 02/08/20 1106  AST 18  ALT 23  ALKPHOS 56  BILITOT 0.4  PROT 6.5  ALBUMIN 3.6   No results for input(s): LIPASE, AMYLASE in the last 168 hours. No results for input(s): AMMONIA in the last 168 hours. Coagulation Profile: No results for input(s): INR, PROTIME in the last 168 hours. CBC: Recent Labs  Lab 02/08/20 1106  WBC 5.2  HGB 10.9*  HCT 34.1*  MCV 94.2  PLT 359   Cardiac Enzymes: No results for input(s): CKTOTAL, CKMB, CKMBINDEX, TROPONINI in the last 168 hours. BNP: Invalid input(s): POCBNP CBG: Recent Labs  Lab 02/08/20 1136  GLUCAP 108*   HbA1C: No results for input(s): HGBA1C in the last 72 hours.  Urine analysis:    Component Value Date/Time   COLORURINE YELLOW 01/01/2020 2133   APPEARANCEUR HAZY (A) 01/01/2020 2133   LABSPEC 1.003 (L) 01/01/2020 2133    PHURINE 6.0 01/01/2020 2133   GLUCOSEU NEGATIVE 01/01/2020 2133   HGBUR LARGE (A) 01/01/2020 2133   BILIRUBINUR NEGATIVE 01/01/2020 2133   KETONESUR 20 (A) 01/01/2020 2133   PROTEINUR 30 (A) 01/01/2020 2133   NITRITE NEGATIVE 01/01/2020 2133   LEUKOCYTESUR LARGE (A) 01/01/2020 2133   Sepsis Labs: @LABRCNTIP (procalcitonin:4,lacticidven:4) )No results found for this or any previous visit (from the past 240 hour(s)).   Scheduled Meds: . diazepam  5 mg Oral Q12H  . feeding supplement  237 mL Oral BID BM  . ferrous sulfate  325 mg Oral Q breakfast  . folic acid  1 mg Oral Daily  . lamoTRIgine  100 mg Oral Daily  . metoprolol tartrate  37.5 mg Oral BID  . OXcarbazepine  150 mg Oral BID  . polyethylene glycol  17 g Oral Daily  . QUEtiapine  300 mg Oral q AM  . QUEtiapine  400 mg Oral QHS  . rivaroxaban  10 mg Oral Q supper  . senna-docusate  2 tablet Oral BID  . thiamine  100 mg Oral Daily   Continuous Infusions:  Procedures/Studies: DG Elbow 2 Views Left  Result Date: 01/14/2020 CLINICAL DATA:  Pain status post fall EXAM: LEFT ELBOW - 2 VIEW COMPARISON:  None. FINDINGS: There is no evidence of fracture, dislocation, or joint effusion. There is no evidence of arthropathy or other focal bone abnormality. Soft tissues are unremarkable. IMPRESSION: Negative. Electronically Signed   By: 13/12/2019 M.D.   On: 01/14/2020 19:31    13/12/2019, DO  [facility]  If 7PM-7AM, please contact night-coverage [email or web address] Password [email or web address] 02/10/2020, 5:57 PM   LOS: 40 days

## 2020-02-11 NOTE — Progress Notes (Signed)
Physical Therapy Treatment Patient Details Name: Isaac [name] MRN: [id number] DOB: 19-Nov-1980 Today's Date: 02/11/2020    History of Present Illness Isaac [name] is a 39 y.o. male with medical history significant of history of bacterial meningitis about 3 months ago, NSTEMI due to demand ischemia during his meningitis hospitalization (see EDP note for attached discharge summary) who is brought to the emergency department by family members due to the patient's worsening mental status.  The patient's mental status has been progressively worse.  He was seen at [facility] about 4 days ago and given Geodon due to restlessness, but subsequently discharged home.  His mother has been having a very difficult time taking care of him at home.  Neurology has been trying to assist in long-term placement, but the patient is uninsured and his mother is unable to afford it.  Neurology recently discontinue Keppra and started on Lamictal to control his agitation.  He has also been given Seroquel without significant results.   PT Comments    Patient presents agreeable and motivated for therapy.  Patient able to stand with 1 person assist with improvement for placing hands on RW, limited mostly due to BLE weakness, tends to veer to the right during gait training requiring tactile assistance to prevent walker from hitting wall/nearby objects, tolerated standing for up to 8-10 minutes using RW while being cleaned by nursing staff and able to sit up in chair after therapy with fiancee present in room.  Patient will benefit from continued physical therapy in hospital and recommended venue below to increase strength, balance, endurance for safe ADLs and gait.    Follow Up Recommendations  SNF     Equipment Recommendations  Rolling walker with 5" wheels    Recommendations for Other Services       Precautions / Restrictions Precautions Precautions: Fall Restrictions Weight Bearing Restrictions: No     Mobility  Bed Mobility Overal bed mobility: Needs Assistance Bed Mobility: Supine to Sit     Supine to sit: Min assist;Mod assist;HOB elevated     General bed mobility comments: demonstrates improvement for initiating move of BLE during supine to sitting  Transfers Overall transfer level: Needs assistance Equipment used: Rolling walker (2 wheeled) Transfers: Sit to/from [employer] Sit to Stand: Mod assist;Max assist Stand pivot transfers: Mod assist;Max assist       General transfer comment: demonstrates improvement for proper hand placement on RW during sit to stands, limited secondary to BLE weakness  Ambulation/Gait Ambulation/Gait assistance: Mod assist Gait Distance (Feet): 50 Feet Assistive device: Rolling walker (2 wheeled) Gait Pattern/deviations: Decreased step length - right;Decreased step length - left;Decreased stride length;Scissoring;Narrow base of support;Drifts right/left;Trunk flexed Gait velocity: decreased   General Gait Details: slightly labored cadence with flexed trunk, frequent drifting to the right requiring tactile assistance to hold RW to prevent bumping into wall/objects, less scissoring of LLE, limited secondary to fatigue, requiring sitting rest break before returning to room   Stairs             Wheelchair Mobility    Modified Rankin (Stroke Patients Only)       Balance Overall balance assessment: Needs assistance Sitting-balance support: Feet supported;No upper extremity supported Sitting balance-Leahy Scale: Fair Sitting balance - Comments: fair/good seated at EOB   Standing balance support: During functional activity;Bilateral upper extremity supported Standing balance-Leahy Scale: Fair Standing balance comment: using RW  Cognition Arousal/Alertness: Awake/alert Behavior During Therapy: WFL for tasks assessed/performed Overall Cognitive Status: History of cognitive  impairments - at baseline                                        Exercises      General Comments        Pertinent Vitals/Pain Pain Assessment: No/denies pain    Home Living                      Prior Function            PT Goals (current goals can now be found in the care plan section) Acute Rehab PT Goals Patient Stated Goal: return home PT Goal Formulation: With patient Time For Goal Achievement: 02/20/20 Potential to Achieve Goals: Good Progress towards PT goals: Progressing toward goals    Frequency    Min 4X/week      PT Plan Current plan remains appropriate    Co-evaluation              AM-PAC PT "6 Clicks" Mobility   Outcome Measure  Help needed turning from your back to your side while in a flat bed without using bedrails?: A Little Help needed moving from lying on your back to sitting on the side of a flat bed without using bedrails?: A Lot Help needed moving to and from a bed to a chair (including a wheelchair)?: A Lot Help needed standing up from a chair using your arms (e.g., wheelchair or bedside chair)?: A Lot Help needed to walk in hospital room?: A Lot Help needed climbing 3-5 steps with a railing? : Total 6 Click Score: 12    End of Session Equipment Utilized During Treatment: Gait belt Activity Tolerance: Patient tolerated treatment well;Patient limited by fatigue Patient left: in chair;with call bell/phone within reach;with chair alarm set;with family/visitor present Nurse Communication: Mobility status PT Visit Diagnosis: Unsteadiness on feet (R26.81);Other abnormalities of gait and mobility (R26.89);Muscle weakness (generalized) (M62.81)     Time: 4481-8563 PT Time Calculation (min) (ACUTE ONLY): 33 min  Charges:  $Gait Training: 8-22 mins $Therapeutic Activity: 8-22 mins                     3:28 PM, 02/11/20 Ocie Bob, MPT Physical Therapist with [facility] 336 [phone]  office [phone] mobile phone

## 2020-02-11 NOTE — Progress Notes (Signed)
Nutrition Follow-up  DOCUMENTATION CODES:   Not applicable  INTERVENTION:  Continue Ensure Enlive po BID, each supplement provides 350 kcal and 20 grams of protein  Continue Magic cup BID with meals, each supplement provides 290 kcal and 9 grams of protein  Continue to encourage po intake of meals and supplements   NUTRITION DIAGNOSIS:   Inadequate oral intake related to inability to eat as evidenced by  (PEG tube dependent for nutrition/hydration and medications s/p multiple subacute strokes with residual weakness secondary to recent bacterial meningitis). -resolved, continues to tolerate oral diet, PEG removed 11/15  GOAL:   Patient will meet greater than or equal to 90% of their needs -Meeting with po intake of meals and supplements  MONITOR:   PO intake, Weight trends, Labs, Diet advancement, I & O's  REASON FOR ASSESSMENT:   Rounds    ASSESSMENT:   39 year old male with history of recent hospitalization for bacterial meningitis (7/24-8/14). 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 Pt presented with progressively worsening mental status, increased agitation and family reports unable to safely manage him at home.  Patient continues to have excellent po intake, eating 75-100% (97% avg x last 8 documented meals 11/29-12/7) and drinking 100% of Ensure supplements BID.  Weights have remained stable since last 7 days, +16.5 lbs since admit.  Medications reviewed and include: Valium, Ferrous sulfate, Folic acid, Trileptal, Miralax, Senokot, Seroquel, Thiamine  No new labs for review   Diet Order:   Diet Order            Diet regular Room service appropriate? Yes; Fluid consistency: Thin  Diet effective now                 EDUCATION NEEDS:   Education needs have been addressed  Skin:  Skin Assessment: Skin Integrity  Issues: Skin Integrity Issues:: Other (Comment) Other: MASD;buttocks;left  Last BM:  12/8-type 4  Height:   Ht Readings from Last 1 Encounters:  01/02/20 6\' 2"  (1.88 m)    Weight:   Wt Readings from Last 1 Encounters:  02/11/20 90.7 kg   BMI:  Body mass index is 25.67 kg/m.  Estimated Nutritional Needs:   Kcal:  2080-2330  Protein:  105-115  Fluid:  >/= 2 L/day   06-10-1970, RD, LDN Clinical Nutrition After Hours/Weekend Pager # in Amion

## 2020-02-11 NOTE — Progress Notes (Signed)
CSW contacted admissions at [facility] [phone]) and spoke with Vonna Kotyk to inquire about patients referral status to [facility].  CSW was informed patient is still under review and would notify if status changes.  Ladoris Gene MSW,LCSWA,LCASA Clinical Social Worker  [place] Disposition [phone] (cell)

## 2020-02-11 NOTE — Progress Notes (Signed)
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 [facility] with altered mental status and fever of 104 F, WBC 17.4 and lactate 4.7 after a day of generalized malaise, nausea, vomiting, weaknessand headache. He had elevated troponins over 5000.Due to combativeness, he required sedation and subsequent intubation [facility] with [facility]. He was started on ceftriaxone. Due to possible seizure, he was started on Keppra and placed on continuous EEG which was negative for seizure activity. 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 He received a 5 day course of SoluMedrol for cerebral edema. He then developed exteensor posturing with tachycardia and hypertension concerning for elevated intracranial pressure. He underwent Bolt ICP monitoring which demonstrated no elevated ICP. Follow up CT head and subsequent repeat MRI of brain showed small subacute hemorrhage in the right suboccipital lobe, either hemorrhagic conversion of small ischemic stroke vs extension of previously seen microhemorrhage, as well as hypoxic injury involving the bilateral caudate and putamen. He also sustained a NSTEMI. He was extubated on 10/10/2019 and underwent slow taper of Dilaudid to minimize withdrawal symptoms. A G-tube was inserted on 10/16/2019 and he was discharged on 10/18/2019 under the care of his mother who is a CNA.   He was brought to hospital with altered mental status.  Assessment/Plan: Acuteon chronicmetabolic encephalopathy--- worsening behavioral problems superimposed on underlying anoxic brain  injury -Abilify 5 mg twice dailydiscontinued 02/03/20 -Continue Lamictal 100 mg daily -Keppra was recently discontinued as patient's EEG was nonepileptiformand may have been contributing to increased agitation -Previously discussed the risks/benefits of seroquel including its "black box warning" with patient's mother  -she expresses understanding in light of the patient's behavioral issues -She expresses that other medications and change of venue/scenery have been tried to try to provide care for the patient -in light of her inability to care for the patient and failure of other modalities, she accepts the risks with seroquel in hopes that his behavior can be controlled to allow for care of the patient. --Following recommendations by psychiatry service: Will continue -valium bid -increased seroquel to 300 mg am and 400 hs -Consult reorientation and supportive care.  -overall now more re-directable and following commands; behaviors have improved significantly after adjustment of medications -Patient remains medically stable, he is awaiting placement to appropriate facility as patient's mother who is a trained CNA is no longer able to manage him at home.  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   -eating well since PEG removed.  SVT/HTN -patient had intermittent episodes of SVT -02/08/20-had episode of SVT -continue metoprolol to 37.5 mg bid -Heart rate stable since last metoprolol dose adjustment..  Social/Ethics -patient's mother was a trained CNA is no longer able to manage him at home due to escalating behaviors --Patient remains total  care- He does not talk much. He is still incontinent. He requires assistance with essential ADLs such as bathing, dressing and using toilet -Familydoes not feel that they can safely  manage the patient at home and arerequesting placement to facility. -Social work consultation to help with placement requested  chronic anemia/iron deficiency anemia -multifactorial , partly nutritional, expect further drop in H&H with hydration/hemodilution -iron sat--9% -folate 7.1 -B12--2310 -c/n iron and folate -Repeat CBC requested for 12/28/2019  Left elbow pain -Patient with ongoing weakness fall out of bed on 2020-01-14 -No swelling, skin breakdown or fractures appreciated on images. -No further significant left nipple pain  Disposition/Need for in-Hospital Stay- patient unable to be discharged at this time due to -behavioral problems requiring placement to appropriate facility --At this time patient does not have a safe discharge plan, -Awaiting placement to appropriate facility as patient's mother who is a trained CNA is no longer able to manage him at home      Status is: Inpatient   Dispo: The patient is from:Home Anticipated d/c is CW:[facility] Anticipated d/c date is: > 3 days Patient currently is medically stable to d/c.     Family Communication:noFamily at bedside  Consultants:psychiatry  Code Status: FULL   DVT Prophylaxis:xarelto   Procedures: As Listed in Progress Note Above  Antibiotics: None   Subjective: In no acute distress.  Patient denies chest pain, nausea, vomiting, dysuria or any other acute complaints at this time.  Was able to follow commands appropriately and per nursing reports no agitation or aggressive behaviors throughout the day.   Objective: Vitals:   02/10/20 2017 02/11/20 0348 02/11/20 0410 02/11/20 1420  BP: 117/70  110/70 126/76  Pulse: 98  76  (!) 103  Resp: 19  18 18   Temp: 99 F (37.2 C)  98.1 F (36.7 C) 98.2 F (36.8 C)  TempSrc: Oral     SpO2: 100%   100%  Weight:  90.7 kg    Height:        Intake/Output Summary (Last 24 hours) at 02/11/2020 1805 Last data filed at 02/11/2020 0500 Gross per 24 hour  Intake 240 ml  Output 400 ml  Net -160 ml   Weight change: 0.1 kg  Exam: General exam: Alert, awake, oriented to person and place intermittently; denies chest pain, nausea, vomiting or any acute distress currently. Respiratory system: Clear to auscultation. Respiratory effort normal.  No using accessory muscle.  Good O2 sat on room air. Cardiovascular system:RRR. No murmurs, rubs, gallops.  No JVD Gastrointestinal system: Abdomen is nondistended, soft and nontender. No organomegaly or masses felt. Normal bowel sounds heard. Central nervous system: No new focal neurological deficits. Extremities: No cyanosis or clubbing. Skin: No rashes, no petechiae. Psychiatry: Mood & affect appropriate at this time..    Data Reviewed: I have personally reviewed following labs and imaging studies  Basic Metabolic Panel: Recent Labs  Lab 02/08/20 1106  NA 138  K 3.8  CL 100  CO2 29  GLUCOSE 116*  BUN 11  CREATININE 0.63  CALCIUM 9.1   Liver Function Tests: Recent Labs  Lab 02/08/20 1106  AST 18  ALT 23  ALKPHOS 56  BILITOT 0.4  PROT 6.5  ALBUMIN 3.6   CBC: Recent Labs  Lab 02/08/20 1106  WBC 5.2  HGB 10.9*  HCT 34.1*  MCV 94.2  PLT 359   CBG: Recent Labs  Lab 02/08/20 1136  GLUCAP 108*   Urine analysis:    Component Value Date/Time   COLORURINE YELLOW 01/01/2020 2133   APPEARANCEUR HAZY (A) 01/01/2020 2133   LABSPEC 1.003 (L) 01/01/2020 2133   PHURINE 6.0 01/01/2020 2133   GLUCOSEU NEGATIVE 01/01/2020  2133   HGBUR LARGE (A) 01/01/2020 2133   BILIRUBINUR NEGATIVE 01/01/2020 2133   KETONESUR 20 (A) 01/01/2020 2133   PROTEINUR 30 (A) 01/01/2020 2133   NITRITE NEGATIVE 01/01/2020 2133    LEUKOCYTESUR LARGE (A) 01/01/2020 2133   Scheduled Meds: . diazepam  5 mg Oral Q12H  . feeding supplement  237 mL Oral BID BM  . ferrous sulfate  325 mg Oral Q breakfast  . folic acid  1 mg Oral Daily  . lamoTRIgine  100 mg Oral Daily  . metoprolol tartrate  37.5 mg Oral BID  . OXcarbazepine  150 mg Oral BID  . polyethylene glycol  17 g Oral Daily  . QUEtiapine  300 mg Oral q AM  . QUEtiapine  400 mg Oral QHS  . rivaroxaban  10 mg Oral Q supper  . senna-docusate  2 tablet Oral BID  . thiamine  100 mg Oral Daily   Continuous Infusions:  Procedures/Studies: DG Elbow 2 Views Left  Result Date: 01/14/2020 CLINICAL DATA:  Pain status post fall EXAM: LEFT ELBOW - 2 VIEW COMPARISON:  None. FINDINGS: There is no evidence of fracture, dislocation, or joint effusion. There is no evidence of arthropathy or other focal bone abnormality. Soft tissues are unremarkable. IMPRESSION: Negative. Electronically Signed   By: Katherine Mantle M.D.   On: 01/14/2020 19:31    Vassie Loll, MD [facility] hospitalist  02/11/2020, 6:05 PM   LOS: 41 days

## 2020-02-11 NOTE — Progress Notes (Signed)
CSW contacted [facility] about pt's referral. Admissions staff requested pt's H&P and psych assessment. Documents have been faxed to [facility] for review.    Disposition will continue to follow.    Wells Guiles, MSW, LCSW, LCAS Clinical Social Worker II Disposition CSW [phone]

## 2020-02-11 NOTE — BH Assessment (Signed)
Reassessment note: Pt presents sitting calmly in chair dressed in gown. Pt briefly acknowledges the tele-health screen but will not interact. Pt made soft sounds to questions about a third of the time. Pt much more responsive to RN and pt's fiancee in room. With prodding by RN, pt answered he goes by "Isaac [name]". Steffanie Rainwater notes improvement in pt's behaviors since being in hospital, with less aggression. Pt continues to meet inpt tx criteria.

## 2020-02-12 NOTE — Progress Notes (Addendum)
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 [facility] on 09/28/[facility] with altered mental status and fever of 104 F, WBC 17.4 and lactate 4.7 after a day of generalized malaise, nausea, vomiting, weaknessand headache. He had elevated troponins over 5000.Due to combativeness, he required sedation and subsequent intubation [facility]. He was started on ceftriaxone. Due to possible seizure, he was started on Keppra and placed on continuous EEG which was negative for seizure activity. 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 He received a 5 day course of SoluMedrol for cerebral edema. He then developed exteensor posturing with tachycardia and hypertension concerning for elevated intracranial pressure. He underwent Bolt ICP monitoring which demonstrated no elevated ICP. Follow up CT head and subsequent repeat MRI of brain showed small subacute hemorrhage in the right suboccipital lobe, either hemorrhagic conversion of small ischemic stroke vs extension of previously seen microhemorrhage, as well as hypoxic injury involving the bilateral caudate and putamen. He also sustained a NSTEMI. He was extubated on 10/10/2019 and underwent slow taper of Dilaudid to minimize withdrawal symptoms. A G-tube was inserted on 10/16/2019 and he was discharged on 10/18/2019 under the care of his mother who is a CNA.   He was brought to hospital with altered mental status.  Assessment/Plan: Acuteon chronicmetabolic encephalopathy--- worsening behavioral problems superimposed on underlying anoxic brain  injury -Abilify 5 mg twice dailydiscontinued 02/03/20 -Continue Lamictal 100 mg daily -Keppra was recently discontinued as patient's EEG was nonepileptiformand may have been contributing to increased agitation -Previously discussed the risks/benefits of seroquel including its "black box warning" with patient's mother  -she expresses understanding in light of the patient's behavioral issues -She expresses that other medications and change of venue/scenery have been tried to try to provide care for the patient -in light of her inability to care for the patient and failure of other modalities, she accepts the risks with seroquel in hopes that his behavior can be controlled to allow for care of the patient. --Following recommendations by psychiatry service: Will continue -valium bid -increased seroquel to 300 mg am and 400 hs -Consult reorientation and supportive care.  -overall now more re-directable and following commands; behaviors have improved significantly after adjustment of medications -Patient remains medically stable, he is awaiting placement to appropriate facility as patient's mother who is a trained CNA is no longer able to manage him at home.  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   -eating well since PEG removed.  SVT/HTN -patient had intermittent episodes of SVT -02/08/20-had episode of SVT -continue metoprolol to 37.5 mg bid -Heart rate stable since last metoprolol dose adjustment..  Social/Ethics -patient's mother was a trained CNA is no longer able to manage him at home due to escalating behaviors --Patient remains total  care- He does not talk much. He is still incontinent. He requires assistance with essential ADLs such as bathing, dressing and using toilet -Familydoes not feel that they can safely  manage the patient at home and arerequesting placement to facility. -Social work consultation to help with placement requested  chronic anemia/iron deficiency anemia -multifactorial , partly nutritional, expect further drop in H&H with hydration/hemodilution -iron sat--9% -folate 7.1 -B12--2310 -c/n iron and folate -Repeat CBC requested for 12/28/2019  Left elbow pain -Patient with ongoing weakness fall out of bed on 2020-01-14 -No swelling, skin breakdown or fractures appreciated on images. -No further significant left nipple pain  Disposition/Need for in-Hospital Stay- patient unable to be discharged at this time due to -behavioral problems requiring placement to appropriate facility --At this time patient does not have a safe discharge plan, -Awaiting placement to appropriate facility as patient's mother who is a trained CNA is no longer able to manage him at home      Status is: Inpatient   Dispo: The patient is from:Home Anticipated d/c is [facility] Anticipated d/c date is: > 3 days Patient currently is medically stable to d/c.     Family Communication:noFamily at bedside  Consultants:psychiatry  Code Status: FULL   DVT Prophylaxis:xarelto   Procedures: As Listed in Progress Note Above  Antibiotics: None   Subjective: No agitation or combative behavior in the last 35 hours.  Patient is afebrile, following commands and in no acute distress.  Denies chest pain, nausea, vomiting, shortness of breath and dysuria.  Objective: Vitals:   02/11/20 0410 02/11/20 1420 02/11/20 2119 02/12/20 0627  BP: 110/70 126/76 126/65 122/73  Pulse: 76 (!) 103 97 98  Resp: 18 18 20 18   Temp: 98.1 F  (36.7 C) 98.2 F (36.8 C) 98 F (36.7 C) 98.4 F (36.9 C)  TempSrc:   Oral Oral  SpO2:  100% 100% 99%  Weight:      Height:        Intake/Output Summary (Last 24 hours) at 02/12/2020 1258 Last data filed at 02/12/2020 14/11/2019 Gross per 24 hour  Intake 240 ml  Output 350 ml  Net -110 ml   Weight change:   Exam: General exam: Alert, cooperative during my examination.  Denies chest pain, no nausea, no shortness of breath, no dysuria.  Good bowel movements reported.  For the last 35 hours no agitation or combative behavior reported. Respiratory system: Clear to auscultation. Respiratory effort normal.  No using accessory muscles. Cardiovascular system: Regular rate and rhythm. No murmurs, rubs, gallops.  No JVD Gastrointestinal system: Abdomen is nondistended, soft and nontender. No organomegaly or masses felt. Normal bowel sounds heard. Central nervous system: No new focal neurological deficits. Extremities: No cyanosis or clubbing. Skin: No petechiae Psychiatry: Mood & affect appropriate at this time.   Data Reviewed: I have personally reviewed following labs and imaging studies  Basic Metabolic Panel: Recent Labs  Lab 02/08/20 1106  NA 138  K 3.8  CL 100  CO2 29  GLUCOSE 116*  BUN 11  CREATININE 0.63  CALCIUM 9.1   Liver Function Tests: Recent Labs  Lab 02/08/20 1106  AST 18  ALT 23  ALKPHOS 56  BILITOT 0.4  PROT 6.5  ALBUMIN 3.6   CBC: Recent Labs  Lab 02/08/20 1106  WBC 5.2  HGB 10.9*  HCT 34.1*  MCV 94.2  PLT 359   CBG: Recent Labs  Lab 02/08/20 1136  GLUCAP 108*   Urine analysis:    Component Value Date/Time   COLORURINE YELLOW 01/01/2020 2133   APPEARANCEUR HAZY (A) 01/01/2020 2133   LABSPEC 1.003 (L) 01/01/2020 2133   PHURINE 6.0 01/01/2020 2133  GLUCOSEU NEGATIVE 01/01/2020 2133   HGBUR LARGE (A) 01/01/2020 2133   BILIRUBINUR NEGATIVE 01/01/2020 2133   KETONESUR 20 (A) 01/01/2020 2133   PROTEINUR 30 (A) 01/01/2020 2133   NITRITE  NEGATIVE 01/01/2020 2133   LEUKOCYTESUR LARGE (A) 01/01/2020 2133   Scheduled Meds: . diazepam  5 mg Oral Q12H  . feeding supplement  237 mL Oral BID BM  . ferrous sulfate  325 mg Oral Q breakfast  . folic acid  1 mg Oral Daily  . lamoTRIgine  100 mg Oral Daily  . metoprolol tartrate  37.5 mg Oral BID  . OXcarbazepine  150 mg Oral BID  . polyethylene glycol  17 g Oral Daily  . QUEtiapine  300 mg Oral q AM  . QUEtiapine  400 mg Oral QHS  . rivaroxaban  10 mg Oral Q supper  . senna-docusate  2 tablet Oral BID  . thiamine  100 mg Oral Daily   Continuous Infusions:  Procedures/Studies: DG Elbow 2 Views Left  Result Date: 01/14/2020 CLINICAL DATA:  Pain status post fall EXAM: LEFT ELBOW - 2 VIEW COMPARISON:  None. FINDINGS: There is no evidence of fracture, dislocation, or joint effusion. There is no evidence of arthropathy or other focal bone abnormality. Soft tissues are unremarkable. IMPRESSION: Negative. Electronically Signed   By: Katherine Mantle M.D.   On: 01/14/2020 19:31    Vassie Loll, MD [facility] hospitalist  02/12/2020, 12:58 PM   LOS: 42 days

## 2020-02-12 NOTE — Progress Notes (Signed)
PT Cancellation Note  Patient Details Name: Isaac [name] MRN: [id number] DOB: 07/14/1980   Cancelled Treatment:    Reason Eval/Treat Not Completed: Fatigue/lethargy limiting ability to participate.  Patient presents sleeping and lethargic and unable to participate with therapy.   4:13 PM, 02/12/20 Ocie Bob, MPT Physical Therapist with [facility] [phone] office [phone] mobile phone

## 2020-02-12 NOTE — Progress Notes (Signed)
Has been calm today.Has delayed verbal responses and with teleppsych interview only gave short one work answers as if he didn't really understand questions. Still feeding self and eating well.

## 2020-02-12 NOTE — BH Assessment (Signed)
Patient was seen for reassessment.  He was alert, but unsure if he was oriented.  When asked where he was, he either did not know or chose not to answer.  Patient looked at this writer with a blank stare.  Patient was asked if he had any thoughts of hurting himself or others and he replied, "no."  Patient had a very blunted affect, but not sure if it is his baseline or not.  RN reports that patient is easily agitated, but has been doing better today with medication changes.  TTS continues to recommend inpatient treatment.

## 2020-02-12 NOTE — BH Assessment (Signed)
CSW received return call from [facility]. Upon review, Pt has been declined for admission. He has been deemed inappropriate based on his needs being "mostly medical". [facility] stated that they further feel he benefit from long term medical treatment but not a psychiatric admission with their facility.   Jacinta Shoe, LCSW Clinical Social Worker [phone]

## 2020-02-12 NOTE — Progress Notes (Signed)
  Speech Language Pathology Treatment: Cognitive-Linquistic  Patient Details Name: Isaac [name] MRN: [id number] DOB: 06/25/1980 Today's Date: 02/12/2020 Time:  -     Assessment / Plan / Recommendation Clinical Impression  Pt seen for ongoing cognitive linguistic intervention. Pt alert and cooperative (had been sleeping earlier today), however appropriate responses to treatment tasks remain limited and inaccurate. Pt verbalized, "Yes Mam" and "I don't know" during structured tasks (picture/object naming, sentence completion, verbal expression, and orientation). Pt required max multimodality cues to complete all tasks.     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 He had a G-tube placed on 10/16/2019 was discharged from [facility] on 10/18/2019.      SLP Plan  Continue with current plan of care       Recommendations                   Plan: Continue with current plan of care       Thank you,  Havery Moros, CCC-SLP [phone]                 Lorie Melichar 02/12/2020, 3:50 PM

## 2020-02-12 NOTE — TOC Progression Note (Signed)
Transition of Care [facility]) - Progression Note    Patient Details  Name: Isaac [name] MRN: [id number] Date of Birth: 07-16-1980  Transition of Care [facility]) CM/SW Contact  Annice Needy, LCSW Phone Number: 02/12/2020, 2:03 PM  Clinical Narrative:    Annie with [facility] disposition advised that [facility] declined patient.      Barriers to Discharge: No SNF bed,Psych Bed not available  Expected Discharge Plan and Services           Expected Discharge Date: 01/26/20                                     Social Determinants of Health (SDOH) Interventions    Readmission Risk Interventions No flowsheet data found.

## 2020-02-12 NOTE — BH Assessment (Signed)
TTS attempted to assess patient, but his nurse stated that patient is sleeping and when awakened, becomes combative.  States that she will call TTS when he is awake to complete his reassessment.

## 2020-02-13 NOTE — BHH Counselor (Signed)
TTS called [phone] to see patient via tele-psych. RN states she will contact this counselor when cart is set up.

## 2020-02-13 NOTE — Progress Notes (Signed)
Physical Therapy Treatment Patient Details Name: Isaac [name] MRN: [id number] DOB: June 28, 1980 Today's Date: 02/13/2020    History of Present Illness Isaac [name] is a 39 y.o. male with medical history significant of history of bacterial meningitis about 3 months ago, NSTEMI due to demand ischemia during his meningitis hospitalization (see EDP note for attached discharge summary) who is brought to the emergency department by family members due to the patient's worsening mental status.  The patient's mental status has been progressively worse.  He was seen at [facility] about 4 days ago and given Geodon due to restlessness, but subsequently discharged home.  His mother has been having a very difficult time taking care of him at home.  Neurology has been trying to assist in long-term placement, but the patient is uninsured and his mother is unable to afford it.  Neurology recently discontinue Keppra and started on Lamictal to control his agitation.  He has also been given Seroquel without significant results.    PT Comments    Patient agreeable and motivated to participate with therapy.  Patient continues to demonstrate improvement for placing hands on RW during sit to stands instead of relying on hand held assistance, slightly increased endurance/distance for gait training, but cadence unsafe due to stepping to fast requiring therapist placed in front of patient during ambulation to prevent loss of balance/bumping into near objects, limited due to fatigue and had to take 2-3 minute rest break before ambulating back to room.  Patient tolerated sitting up in chair after therapy - RN notified.  Patient will benefit from continued physical therapy in hospital and recommended venue below to increase strength, balance, endurance for safe ADLs and gait.    Follow Up Recommendations  SNF     Equipment Recommendations  Rolling walker with 5" wheels    Recommendations for Other Services        Precautions / Restrictions Precautions Precautions: Fall Restrictions Weight Bearing Restrictions: No    Mobility  Bed Mobility Overal bed mobility: Needs Assistance Bed Mobility: Supine to Sit     Supine to sit: Min assist;Mod assist;HOB elevated     General bed mobility comments: required Min/mod assist to move legs during supine to sitting  Transfers Overall transfer level: Needs assistance Equipment used: Rolling walker (2 wheeled) Transfers: Sit to/from [employer] Sit to Stand: Mod assist;Max assist Stand pivot transfers: Mod assist;Max assist       General transfer comment: fair/good return for placing hands on RW during sit to stands, tends to attempt sitting when not over chair or wheelchair  Ambulation/Gait Ambulation/Gait assistance: Mod assist Gait Distance (Feet): 60 Feet Assistive device: Rolling walker (2 wheeled) Gait Pattern/deviations: Decreased step length - right;Decreased step length - left;Decreased stride length;Drifts right/left;Trunk flexed Gait velocity: near normal   General Gait Details: requires verbals to stand upright, drifts to the left requiring tactile assistance holding RW to avoid bumping into objects, once fatigued leans over RW, required sitting rest break before returning to room   Stairs             Wheelchair Mobility    Modified Rankin (Stroke Patients Only)       Balance Overall balance assessment: Needs assistance Sitting-balance support: Feet supported;No upper extremity supported Sitting balance-Leahy Scale: Fair Sitting balance - Comments: fair/good seated at EOB   Standing balance support: During functional activity;Bilateral upper extremity supported Standing balance-Leahy Scale: Fair Standing balance comment: using RW  Cognition Arousal/Alertness: Awake/alert Behavior During Therapy: WFL for tasks assessed/performed Overall Cognitive Status:  History of cognitive impairments - at baseline                                        Exercises General Exercises - Lower Extremity Long Arc Quad: Seated;AAROM;Strengthening;Both;10 reps Hip Flexion/Marching: Seated;AAROM;Strengthening;Both;10 reps    General Comments        Pertinent Vitals/Pain Pain Assessment: No/denies pain    Home Living                      Prior Function            PT Goals (current goals can now be found in the care plan section) Acute Rehab PT Goals Patient Stated Goal: return home PT Goal Formulation: With patient Time For Goal Achievement: 02/20/20 Potential to Achieve Goals: Good Progress towards PT goals: Progressing toward goals    Frequency    Min 4X/week      PT Plan Current plan remains appropriate    Co-evaluation              AM-PAC PT "6 Clicks" Mobility   Outcome Measure  Help needed turning from your back to your side while in a flat bed without using bedrails?: A Little Help needed moving from lying on your back to sitting on the side of a flat bed without using bedrails?: A Lot Help needed moving to and from a bed to a chair (including a wheelchair)?: A Lot Help needed standing up from a chair using your arms (e.g., wheelchair or bedside chair)?: A Lot Help needed to walk in hospital room?: A Lot Help needed climbing 3-5 steps with a railing? : Total 6 Click Score: 12    End of Session Equipment Utilized During Treatment: Gait belt Activity Tolerance: Patient tolerated treatment well;Patient limited by fatigue Patient left: in chair;with call bell/phone within reach;with chair alarm set Nurse Communication: Mobility status PT Visit Diagnosis: Unsteadiness on feet (R26.81);Other abnormalities of gait and mobility (R26.89);Muscle weakness (generalized) (M62.81)     Time: 5188-4166 PT Time Calculation (min) (ACUTE ONLY): 31 min  Charges:  $Gait Training: 8-22 mins $Therapeutic  Activity: 8-22 mins                     12:24 PM, 02/13/20 Ocie Bob, MPT Physical Therapist with [facility] [phone] office [phone] mobile phone

## 2020-02-13 NOTE — Progress Notes (Addendum)
  Speech Language Pathology Treatment: Cognitive-Linquistic  Patient Details Name: Isaac [name] MRN: [id number] DOB: 10-Sep-1980 Today's Date: 02/13/2020 Time: 2585-2778 SLP Time Calculation (min) (ACUTE ONLY): 19 min  Assessment / Plan / Recommendation Clinical Impression  SLP provided ongoing diagnostic speech therapy targeting verbal production, pragmatic engagement and functional following directions. Pt was cooperative and pleasant today; Pt demonstrated perseverated verbal responses including "yes mam", "I don't know" and whenever he was shown pictures he produced "oh man!" SLP presented pictures of Pt's children, despite SLP stating names, Pt did not/could not say children's name when asked to repeat from a model provided. A familiar song was played and Pt nodded his head in rhythm, sang chorus phrases and seemed to enjoy music played. SLP presented a wash cloth and instructed Pt to wash his face and/or hands, Pt said "no" and handed it back to SLP despite multiple attempts with various approaches. Pt requires maximal cues to complete all tasks today, however, note improved participation.    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 He had a G-tube placed on 10/16/2019 was discharged from [facility] on 10/18/2019.      SLP Plan  Continue with current plan of care                      Oral Care Recommendations: Oral  care BID;Staff/trained caregiver to provide oral care Follow up Recommendations: Outpatient SLP;Skilled Nursing facility SLP Visit Diagnosis: Cognitive communication deficit (R41.841);Frontal lobe and executive function deficit;Attention and concentration deficit Attention and concentration deficit following: Cerebral infarction;Other cerebrovascular disease Frontal lobe and executive function deficit following: Cerebral infarction Plan: Continue with current plan of care       Amelia H. Romie Levee, CCC-SLP Speech Language Pathologist   Georgetta Haber 02/13/2020, 2:47 PM

## 2020-02-13 NOTE — Progress Notes (Signed)
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 Hepresented to the [facility] ED on 09/28/2019 with altered mental status and fever of 104 F, WBC 17.4 and lactate 4.7 after a day of generalized malaise, nausea, vomiting, weaknessand headache. He had elevated troponins over 5000.Due to combativeness, he required sedation and subsequent intubation [facility] with [facility]. He was started on ceftriaxone. Due to possible seizure, he was started on Keppra and placed on continuous EEG which was negative for seizure activity. 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 He received a 5 day course of SoluMedrol for cerebral edema. He then developed exteensor posturing with tachycardia and hypertension concerning for elevated intracranial pressure. He underwent Bolt ICP monitoring which demonstrated no elevated ICP. Follow up CT head and subsequent repeat MRI of brain showed small subacute hemorrhage in the right suboccipital lobe, either hemorrhagic conversion of small ischemic stroke vs extension of previously seen microhemorrhage, as well as hypoxic injury involving the bilateral caudate and putamen. He also sustained a NSTEMI. He was extubated on 10/10/2019 and underwent slow taper of Dilaudid to minimize withdrawal symptoms. A G-tube was inserted on 10/16/2019 and he was discharged on 10/18/2019 under the care of his mother who is a CNA.   He was brought to hospital with altered mental status.  Assessment/Plan: Acuteon chronicmetabolic encephalopathy--- worsening behavioral problems superimposed on underlying anoxic brain  injury -Abilify 5 mg twice dailydiscontinued 02/03/20 -Continue Lamictal 100 mg daily -Keppra was recently discontinued as patient's EEG was nonepileptiformand may have been contributing to increased agitation -Previously discussed the risks/benefits of seroquel including its "black box warning" with patient's mother  -she expresses understanding in light of the patient's behavioral issues -She expresses that other medications and change of venue/scenery have been tried to try to provide care for the patient -in light of her inability to care for the patient and failure of other modalities, she accepts the risks with seroquel in hopes that his behavior can be controlled to allow for care of the patient. --Following recommendations by psychiatry service: Will continue -valium bid -increased seroquel to 300 mg am and 400 hs -Consult reorientation and supportive care.  -overall now more re-directable and following commands; behaviors have improved significantly after adjustment of medications -Patient remains medically stable, he is awaiting placement to appropriate facility as patient's mother who is a trained CNA is no longer able to manage him at home.  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   -eating well since PEG removed.  SVT/HTN -patient had intermittent episodes of SVT -02/08/20-had episode of SVT -continue metoprolol to 37.5 mg bid -Heart rate stable since last metoprolol dose adjustment..  Social/Ethics -patient's mother was a trained CNA is no longer able to manage him at home due to escalating behaviors --Patient remains total  care- He does not talk much. He is still incontinent. He requires assistance with essential ADLs such as bathing, dressing and using toilet -Familydoes not feel that they can safely  manage the patient at home and arerequesting placement to facility. -Social work consultation to help with placement requested  chronic anemia/iron deficiency anemia -multifactorial , partly nutritional, expect further drop in H&H with hydration/hemodilution -iron sat--9% -folate 7.1 -B12--2310 -c/n iron and folate -Repeat CBC requested for 12/28/2019  Left elbow pain -Patient with ongoing weakness fall out of bed on 2020-01-14 -No swelling, skin breakdown or fractures appreciated on images. -No further significant left nipple pain  Disposition/Need for in-Hospital Stay- patient unable to be discharged at this time due to -behavioral problems requiring placement to appropriate facility --At this time patient does not have a safe discharge plan, -Awaiting placement to appropriate facility as patient's mother who is a trained CNA is no longer able to manage him at home      Status is: Inpatient   Dispo: The patient is from:Home Anticipated d/c is IP:[facility] Anticipated d/c date is: > 3 days Patient currently is medically stable to d/c.     Family Communication:noFamily at bedside  Consultants:psychiatry  Code Status: FULL   DVT Prophylaxis:xarelto   Procedures: As Listed in Progress Note Above  Antibiotics: None   Subjective: Cooperative behavior has been reported; uneventful night.  No agitation, no chest pain, no nausea, no vomiting, no shortness of breath.  Objective: Vitals:   02/12/20 1717 02/12/20 2031 02/12/20 2200 02/13/20 0610  BP: 121/69  134/72 129/83  Pulse: 88  85 74  Resp: 16  20 17   Temp: 98.5 F (36.9 C)  98.3 F (36.8 C) 97.6 F (36.4 C)  TempSrc:      SpO2: 99%  98% 100% 100%  Weight:      Height:        Intake/Output Summary (Last 24 hours) at 02/13/2020 1150 Last data filed at 02/13/2020 0900 Gross per 24 hour  Intake 720 ml  Output 2250 ml  Net -1530 ml   Weight change:   Exam: General exam: Afebrile, cooperative with examination, following commands appropriately and in no acute distress.  Patient denies pain, nausea or vomiting.  Expressed that he would like to take a shower. Respiratory system: Clear to auscultation. Respiratory effort normal.  No using accessory muscle.  Good O2 sat on room air. Cardiovascular system:RRR. No murmurs, rubs, gallops.  No JVD. Gastrointestinal system: Abdomen is nondistended, soft and nontender. No organomegaly or masses felt. Normal bowel sounds heard. Central nervous system: Alert and oriented. No focal neurological deficits. Extremities: No cyanosis or clubbing. Skin: No rashes, no petechiae. Psychiatry: Mood & affect appropriate currently.    Data Reviewed: I have personally reviewed following labs and imaging studies  Basic Metabolic Panel: Recent Labs  Lab 02/08/20 1106  NA 138  K 3.8  CL 100  CO2 29  GLUCOSE 116*  BUN 11  CREATININE 0.63  CALCIUM 9.1   Liver Function Tests: Recent Labs  Lab 02/08/20 1106  AST 18  ALT 23  ALKPHOS 56  BILITOT 0.4  PROT 6.5  ALBUMIN 3.6   CBC: Recent Labs  Lab 02/08/20 1106  WBC 5.2  HGB 10.9*  HCT 34.1*  MCV 94.2  PLT 359   CBG: Recent Labs  Lab 02/08/20 1136  GLUCAP 108*   Urine analysis:    Component Value Date/Time   COLORURINE YELLOW 01/01/2020 2133   APPEARANCEUR HAZY (A) 01/01/2020 2133   LABSPEC 1.003 (L) 01/01/2020 2133   PHURINE 6.0 01/01/2020 2133   GLUCOSEU NEGATIVE 01/01/2020 2133   HGBUR LARGE (A) 01/01/2020 2133   BILIRUBINUR  NEGATIVE 01/01/2020 2133   KETONESUR 20 (A) 01/01/2020 2133   PROTEINUR 30 (A) 01/01/2020 2133   NITRITE NEGATIVE 01/01/2020 2133   LEUKOCYTESUR LARGE (A) 01/01/2020 2133    Scheduled Meds: . diazepam  5 mg Oral Q12H  . feeding supplement  237 mL Oral BID BM  . ferrous sulfate  325 mg Oral Q breakfast  . folic acid  1 mg Oral Daily  . lamoTRIgine  100 mg Oral Daily  . metoprolol tartrate  37.5 mg Oral BID  . OXcarbazepine  150 mg Oral BID  . polyethylene glycol  17 g Oral Daily  . QUEtiapine  300 mg Oral q AM  . QUEtiapine  400 mg Oral QHS  . rivaroxaban  10 mg Oral Q supper  . senna-docusate  2 tablet Oral BID  . thiamine  100 mg Oral Daily   Continuous Infusions:  Procedures/Studies: DG Elbow 2 Views Left  Result Date: 01/14/2020 CLINICAL DATA:  Pain status post fall EXAM: LEFT ELBOW - 2 VIEW COMPARISON:  None. FINDINGS: There is no evidence of fracture, dislocation, or joint effusion. There is no evidence of arthropathy or other focal bone abnormality. Soft tissues are unremarkable. IMPRESSION: Negative. Electronically Signed   By: Katherine Mantle M.D.   On: 01/14/2020 19:31    Vassie Loll, MD [facility] hospital[facility]  02/13/2020, 11:50 AM   LOS: 43 days

## 2020-02-13 NOTE — Progress Notes (Signed)
Patient has had uneventful shift. Patient has been cooperative with staff.

## 2020-02-14 NOTE — Progress Notes (Signed)
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 Hepresented to the [facility] ED on 09/28/2019 with altered mental status and fever of 104 F, WBC 17.4 and lactate 4.7 after a day of generalized malaise, nausea, vomiting, weaknessand headache. He had elevated troponins over 5000.Due to combativeness, he required sedation and subsequent intubation [facility] and diagnosed with pneumococcal meningitis. He was started on ceftriaxone. Due to possible seizure, he was started on Keppra and placed on continuous EEG which was negative for seizure activity. 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 He received a 5 day course of SoluMedrol for cerebral edema. He then developed exteensor posturing with tachycardia and hypertension concerning for elevated intracranial pressure. He underwent Bolt ICP monitoring which demonstrated no elevated ICP. Follow up CT head and subsequent repeat MRI of brain showed small subacute hemorrhage in the right suboccipital lobe, either hemorrhagic conversion of small ischemic stroke vs extension of previously seen microhemorrhage, as well as hypoxic injury involving the bilateral caudate and putamen. He also sustained a NSTEMI. He was extubated on 10/10/2019 and underwent slow taper of Dilaudid to minimize withdrawal symptoms. A G-tube was inserted on 10/16/2019 and he was discharged on 10/18/2019 under the care of his mother who is a CNA.   He was brought to hospital with altered mental status.  Assessment/Plan: Acuteon chronicmetabolic encephalopathy--- worsening behavioral problems superimposed on underlying anoxic brain  injury -Abilify 5 mg twice dailydiscontinued 02/03/20 -Continue Lamictal 100 mg daily -Keppra was recently discontinued as patient's EEG was nonepileptiformand may have been contributing to increased agitation -Previously discussed the risks/benefits of seroquel including its "black box warning" with patient's mother  -she expresses understanding in light of the patient's behavioral issues -She expresses that other medications and change of venue/scenery have been tried to try to provide care for the patient -in light of her inability to care for the patient and failure of other modalities, she accepts the risks with seroquel in hopes that his behavior can be controlled to allow for care of the patient. --Following recommendations by psychiatry service: Will continue -valium bid -increased seroquel to 300 mg am and 400 hs -Consult reorientation and supportive care.  -overall now more re-directable and following commands; behaviors have improved significantly after adjustment of medications -Patient remains medically stable, he is awaiting placement to appropriate facility as patient's mother who is a trained CNA is no longer able to manage him at home.  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   -eating well since PEG removed.  SVT/HTN -patient had intermittent episodes of SVT -02/08/20-had episode of SVT -continue metoprolol to 37.5 mg bid -Heart rate stable since last metoprolol dose adjustment..  Social/Ethics -patient's mother was a trained CNA is no longer able to manage him at home due to escalating behaviors --Patient remains total  care- He does not talk much. He is still incontinent. He requires assistance with essential ADLs such as bathing, dressing and using toilet -Familydoes not feel that they can safely  manage the patient at home and arerequesting placement to facility. -Social work consultation to help with placement requested  chronic anemia/iron deficiency anemia -multifactorial , partly nutritional, expect further drop in H&H with hydration/hemodilution -iron sat--9% -folate 7.1 -B12--2310 -c/n iron and folate -Repeat CBC requested for 12/28/2019  Left elbow pain -Patient with ongoing weakness fall out of bed on 2020-01-14 -No swelling, skin breakdown or fractures appreciated on images. -No further significant left nipple pain  Disposition/Need for in-Hospital Stay- patient unable to be discharged at this time due to -behavioral problems requiring placement to appropriate facility --At this time patient does not have a safe discharge plan, -Awaiting placement to appropriate facility as patient's mother who is a trained CNA is no longer able to manage him at home      Status is: Inpatient   Dispo: The patient is from:Home Anticipated d/c is LX:[facility] Anticipated d/c date is: > 3 days Patient currently is medically stable to d/c.     Family Communication:noFamily at bedside  Consultants:psychiatry  Code Status: FULL   DVT Prophylaxis:xarelto   Procedures: As Listed in Progress Note Above  Antibiotics: None   Subjective: Cooperative behavior appreciated; no overnight events.  Patient denies acute distress.  He is afebrile, no chest pain, no nausea, no vomiting.  Objective: Vitals:   02/13/20 0610 02/13/20 1502 02/13/20 1900 02/14/20 0700  BP: 129/83 120/79 111/70   Pulse: 74 97 (!) 102   Resp: 17 20 18    Temp: 97.6 F (36.4 C) 98.9 F (37.2 C) 99.5 F (37.5 C)   TempSrc:    Oral   SpO2: 100% 97% 100%   Weight:    95.8 kg  Height:        Intake/Output Summary (Last 24 hours) at 02/14/2020 1119 Last data filed at 02/14/2020 0900 Gross per 24 hour  Intake 1800 ml  Output 600 ml  Net 1200 ml   Weight change:   Exam: General exam: Alert, awake, oriented x 1-2 intermittently; afebrile, following commands appropriately.  No overnight events and currently no acute distress or complaints. Respiratory system: Clear to auscultation. Respiratory effort normal.  No using accessory muscles. Cardiovascular system:RRR. No murmurs, rubs, gallops.  No JVD Gastrointestinal system: Abdomen is nondistended, soft and nontender. No organomegaly or masses felt. Normal bowel sounds heard. Central nervous system: No new focal neurological deficits. Extremities: No cyanosis or clubbing. Skin: No petechiae. Psychiatry: Mood & affect appropriate.    Data Reviewed: I have personally reviewed following labs and imaging studies  Basic Metabolic Panel: Recent Labs  Lab 02/08/20 1106  NA 138  K 3.8  CL 100  CO2 29  GLUCOSE 116*  BUN 11  CREATININE 0.63  CALCIUM 9.1   Liver Function Tests: Recent Labs  Lab 02/08/20 1106  AST 18  ALT 23  ALKPHOS 56  BILITOT 0.4  PROT 6.5  ALBUMIN 3.6   CBC: Recent Labs  Lab 02/08/20 1106  WBC 5.2  HGB 10.9*  HCT 34.1*  MCV 94.2  PLT 359   CBG: Recent Labs  Lab 02/08/20 1136  GLUCAP 108*   Urine analysis:    Component Value Date/Time   COLORURINE YELLOW 01/01/2020 2133   APPEARANCEUR HAZY (A) 01/01/2020 2133   LABSPEC 1.003 (L) 01/01/2020 2133   PHURINE 6.0 01/01/2020 2133   GLUCOSEU NEGATIVE 01/01/2020 2133   HGBUR LARGE (A) 01/01/2020 2133   BILIRUBINUR NEGATIVE 01/01/2020 2133   KETONESUR 20 (A) 01/01/2020 2133   PROTEINUR 30 (A) 01/01/2020 2133  NITRITE NEGATIVE 01/01/2020 2133   LEUKOCYTESUR LARGE (A) 01/01/2020 2133   Scheduled Meds: . diazepam  5 mg Oral Q12H  . feeding supplement  237 mL Oral  BID BM  . ferrous sulfate  325 mg Oral Q breakfast  . folic acid  1 mg Oral Daily  . lamoTRIgine  100 mg Oral Daily  . metoprolol tartrate  37.5 mg Oral BID  . OXcarbazepine  150 mg Oral BID  . polyethylene glycol  17 g Oral Daily  . QUEtiapine  300 mg Oral q AM  . QUEtiapine  400 mg Oral QHS  . rivaroxaban  10 mg Oral Q supper  . senna-docusate  2 tablet Oral BID  . thiamine  100 mg Oral Daily   Continuous Infusions:  Procedures/Studies: No results found.  Vassie Loll, MD [facility] hospital[facility]  02/14/2020, 11:19 AM   LOS: 44 days

## 2020-02-15 NOTE — Progress Notes (Addendum)
CSW contacted [facility] at [phone]. Pt has been declined by their facility.    Wells Guiles, MSW, LCSW, LCAS Clinical Social Worker II Disposition CSW [phone]

## 2020-02-15 NOTE — Progress Notes (Signed)
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 Hepresented to the [facility] ED on 09/28/2019 with altered mental status and fever of 104 F, WBC 17.4 and lactate 4.7 after a day of generalized malaise, nausea, vomiting, weaknessand headache. He had elevated troponins over 5000.Due to combativeness, he required sedation and subsequent [facility] with [facility]. He was started on ceftriaxone. Due to possible seizure, he was started on Keppra and placed on continuous EEG which was negative for seizure activity. 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 He received a 5 day course of SoluMedrol for cerebral edema. He then developed exteensor posturing with tachycardia and hypertension concerning for elevated intracranial pressure. He underwent Bolt ICP monitoring which demonstrated no elevated ICP. Follow up CT head and subsequent repeat MRI of brain showed small subacute hemorrhage in the right suboccipital lobe, either hemorrhagic conversion of small ischemic stroke vs extension of previously seen microhemorrhage, as well as hypoxic injury involving the bilateral caudate and putamen. He also sustained a NSTEMI. He was extubated on 10/10/2019 and underwent slow taper of Dilaudid to minimize withdrawal symptoms. A G-tube was inserted on 10/16/2019 and he was discharged on 10/18/2019 under the care of his mother who is a CNA.   He was brought to hospital with altered mental status.  Assessment/Plan: Acuteon chronicmetabolic encephalopathy--- worsening behavioral problems superimposed on underlying anoxic brain  injury -Abilify 5 mg twice dailydiscontinued 02/03/20 -Continue Lamictal 100 mg daily -Keppra was recently discontinued as patient's EEG was nonepileptiformand may have been contributing to increased agitation -Previously discussed the risks/benefits of seroquel including its "black box warning" with patient's mother  -she expresses understanding in light of the patient's behavioral issues -She expresses that other medications and change of venue/scenery have been tried to try to provide care for the patient -in light of her inability to care for the patient and failure of other modalities, she accepts the risks with seroquel in hopes that his behavior can be controlled to allow for care of the patient. -continue to follow rec's by psychiatry/TTS service -Continue valium bid -will continue current adjusted seroquel dosages (300 mg am and 400 hs) -Consult reorientation and supportive care.  -overall now more re-directable and following commands; behaviors have improved significantly after adjustment of medications -Patient remains medically stable, he is awaiting placement to appropriate facility as patient's mother who is a trained CNA is no longer able to manage him at home.  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   -eating well since PEG removed. -patient will continue therapy by speech therapy.  SVT/HTN -patient had intermittent episodes of SVT -02/08/20-had episode of SVT -continue metoprolol to 37.5 mg bid -Heart rate stable since last metoprolol dose adjustment..  Social/Ethics -patient's mother was a trained CNA is no longer able  to manage him at home due to escalating behaviors --Patient remains total care- He does not talk much. He is still incontinent. He requires assistance with essential ADLs such as bathing,  dressing and using toilet -Familydoes not feel that they can safely manage the patient at home and arerequesting placement to facility. -Social work consultation to help with placement requested  chronic anemia/iron deficiency anemia -multifactorial , partly nutritional, expect further drop in H&H with hydration/hemodilution -iron sat--9% -folate 7.1 -B12--2310 -continue iron and folate. -will attempt to repeat cbc in am.   Disposition/Need for in-Hospital Stay- patient unable to be discharged at this time due to -behavioral problems requiring placement to appropriate facility --At this time patient does not have a safe discharge plan, -Awaiting placement to appropriate facility as patient's mother who is a trained CNA is no longer able to manage him at home.    Status is: Inpatient   Dispo: The patient is from:Home Anticipated d/c is RX:[id number] [facility] Anticipated d/c date is: to be determined Patient currently is medically stable to d/c.     Family Communication:noFamily at bedside  Consultants:psychiatry  Code Status: FULL   DVT Prophylaxis:xarelto   Procedures: As Listed in Progress Note Above  Antibiotics: None   Subjective: No fever, no vomiting, no CP or SOB. Patient with another uneventful shift and cooperative behavior. No acute complaints.   Objective: Vitals:   02/14/20 0700 02/14/20 1859 02/14/20 2148 02/15/20 0638  BP:  135/84 (!) 121/58 119/70  Pulse:  97 87 80  Resp:  20 16 18   Temp:  99.1 F (37.3 C) 98 F (36.7 C) 97.8 F (36.6 C)  TempSrc:  Oral Oral Oral  SpO2:  97% 99% 100%  Weight: 95.8 kg     Height:        Intake/Output Summary (Last 24 hours) at 02/15/2020  0759 Last data filed at 02/15/2020 0600 Gross per 24 hour  Intake 1920 ml  Output --  Net 1920 ml   Weight change:   Exam: General exam: Alert, awake, cooperative and following commands. NAD. Afebrile and reporting no complaints.  Respiratory system: Clear to auscultation. Respiratory effort normal. Cardiovascular system:RRR. No murmurs, rubs, gallops. No JVD Gastrointestinal system: Abdomen is nondistended, soft and nontender. No organomegaly or masses felt. Normal bowel sounds heard. Central nervous system: No new focal neurological deficits. Extremities: No cyanosis, no clubbing Skin: No petechiae. Psychiatry: Judgement and insight appear impaired from his underlying TBI and previous neurologic deficits. Mood & affect appropriate currently.   Data Reviewed: I have personally reviewed following labs and imaging studies  Basic Metabolic Panel: Recent Labs  Lab 02/08/20 1106  NA 138  K 3.8  CL 100  CO2 29  GLUCOSE 116*  BUN 11  CREATININE 0.63  CALCIUM 9.1   Liver Function Tests: Recent Labs  Lab 02/08/20 1106  AST 18  ALT 23  ALKPHOS 56  BILITOT 0.4  PROT 6.5  ALBUMIN 3.6   CBC: Recent Labs  Lab 02/08/20 1106  WBC 5.2  HGB 10.9*  HCT 34.1*  MCV 94.2  PLT 359   CBG: Recent Labs  Lab 02/08/20 1136  GLUCAP 108*   Urine analysis:    Component Value Date/Time   COLORURINE YELLOW 01/01/2020 2133   APPEARANCEUR HAZY (A) 01/01/2020 2133   LABSPEC 1.003 (L) 01/01/2020 2133   PHURINE 6.0 01/01/2020 2133   GLUCOSEU NEGATIVE 01/01/2020 2133   HGBUR LARGE (A) 01/01/2020 2133   BILIRUBINUR NEGATIVE 01/01/2020 2133   KETONESUR 20 (A) 01/01/2020 2133   PROTEINUR 30 (A) 01/01/2020 2133   NITRITE NEGATIVE 01/01/2020 2133   LEUKOCYTESUR LARGE (A) 01/01/2020 2133   Scheduled Meds:  diazepam  5  mg Oral Q12H   feeding supplement  237 mL Oral BID BM   ferrous sulfate  325 mg Oral Q breakfast   folic acid  1 mg Oral Daily   lamoTRIgine  100 mg Oral  Daily   metoprolol tartrate  37.5 mg Oral BID   OXcarbazepine  150 mg Oral BID   polyethylene glycol  17 g Oral Daily   QUEtiapine  300 mg Oral q AM   QUEtiapine  400 mg Oral QHS   rivaroxaban  10 mg Oral Q supper   senna-docusate  2 tablet Oral BID   thiamine  100 mg Oral Daily   Continuous Infusions:  Procedures/Studies: No results found.  Vassie Loll, MD [facility] hospital[facility]  02/15/2020, 7:59 AM   LOS: 45 days

## 2020-02-15 NOTE — TOC Progression Note (Signed)
Transition of Care [facility]) - Progression Note    Patient Details  Name: Kjuan Seipp MRN: [id number] Date of Birth: 10-16-1980  Transition of Care [facility]) CM/SW Contact  Elliot Gault, LCSW Phone Number: 02/15/2020, 10:31 AM  Clinical Narrative:     TOC following. Per MD and RNs, pt has gone four days/nights in a row being cooperative and calm. TOC will follow up with department administration for follow up with potential SNF options with pt's improved behavioral status.    Barriers to Discharge: No SNF bed,Psych Bed not available  Expected Discharge Plan and Services           Expected Discharge Date: 01/26/20                                     Social Determinants of Health (SDOH) Interventions    Readmission Risk Interventions No flowsheet data found.

## 2020-02-15 NOTE — Progress Notes (Signed)
Patient had uneventful shift and was cooperative and calm with staff.

## 2020-02-16 NOTE — Progress Notes (Signed)
Physical Therapy Treatment Patient Details Name: Isaac [name] MRN: [id number] DOB: Sep 15, 1980 Today's Date: 02/16/2020    History of Present Illness Isaac [name] is a 39 y.o. male with medical history significant of history of bacterial meningitis about 3 months ago, NSTEMI due to demand ischemia during his meningitis hospitalization (see EDP note for attached discharge summary) who is brought to the emergency department by family members due to the patient's worsening mental status.  The patient's mental status has been progressively worse.  He was seen at [facility] about 4 days ago and given Geodon due to restlessness, but subsequently discharged home.  His mother has been having a very difficult time taking care of him at home.  Neurology has been trying to assist in long-term placement, but the patient is uninsured and his mother is unable to afford it.  Neurology recently discontinue Keppra and started on Lamictal to control his agitation.  He has also been given Seroquel without significant results.    PT Comments    Patient demonstrates slightly increased endurance/distance for gait training, required occasional verbal cues to extend trunk with fair/good carryover, tends to drift to the left, mostly 1 person assist other than having helper follow with wheelchair during ambulation.  Patient tolerated sitting up in chair after therapy with his sister present in room - nursing staff aware.  Patient will benefit from continued physical therapy in hospital and recommended venue below to increase strength, balance, endurance for safe ADLs and gait.    Follow Up Recommendations  SNF     Equipment Recommendations  Rolling walker with 5" wheels    Recommendations for Other Services       Precautions / Restrictions Precautions Precautions: Fall Restrictions Weight Bearing Restrictions: No    Mobility  Bed Mobility Overal bed mobility: Needs Assistance Bed Mobility: Sit to Supine      Supine to sit: Mod assist;HOB elevated     General bed mobility comments: slow labored movement with HOB raised approximately 30 degrees  Transfers Overall transfer level: Needs assistance Equipment used: Rolling walker (2 wheeled) Transfers: Sit to/from [employer] Sit to Stand: Mod assist Stand pivot transfers: Mod assist       General transfer comment: slightly increased BLE strength for completing sit to stands  Ambulation/Gait Ambulation/Gait assistance: Mod assist;Min assist Gait Distance (Feet): 70 Feet Assistive device: Rolling walker (2 wheeled) Gait Pattern/deviations: Decreased step length - right;Decreased step length - left;Decreased stride length;Drifts right/left;Trunk flexed Gait velocity: near normal   General Gait Details: slightly increased endurance/distance for ambulation with frequent veering to the left, occasional verbal cues to extend trunk with fair/good carryover, required 1-2 minute sitting rest break before returning to room, follow with wheelchair for safety/rest break   Stairs             Wheelchair Mobility    Modified Rankin (Stroke Patients Only)       Balance Overall balance assessment: Needs assistance Sitting-balance support: Feet supported;No upper extremity supported Sitting balance-Leahy Scale: Fair Sitting balance - Comments: fair/good seated at EOB   Standing balance support: During functional activity;Bilateral upper extremity supported Standing balance-Leahy Scale: Fair Standing balance comment: using RW                            Cognition Arousal/Alertness: Awake/alert Behavior During Therapy: WFL for tasks assessed/performed Overall Cognitive Status: History of cognitive impairments - at baseline  General Comments: Patient agreeable and motivated for therapy      Exercises General Exercises - Lower Extremity Long Arc Quad:  Seated;AAROM;Strengthening;Both;10 reps Hip Flexion/Marching: Seated;AAROM;Strengthening;Both;10 reps    General Comments        Pertinent Vitals/Pain Pain Assessment: No/denies pain    Home Living                      Prior Function            PT Goals (current goals can now be found in the care plan section) Acute Rehab PT Goals Patient Stated Goal: return home PT Goal Formulation: With patient Time For Goal Achievement: 02/20/20 Potential to Achieve Goals: Good Progress towards PT goals: Progressing toward goals    Frequency    Min 4X/week      PT Plan Current plan remains appropriate    Co-evaluation PT/OT/SLP Co-Evaluation/Treatment: Yes            AM-PAC PT "6 Clicks" Mobility   Outcome Measure  Help needed turning from your back to your side while in a flat bed without using bedrails?: A Little Help needed moving from lying on your back to sitting on the side of a flat bed without using bedrails?: A Lot Help needed moving to and from a bed to a chair (including a wheelchair)?: A Lot Help needed standing up from a chair using your arms (e.g., wheelchair or bedside chair)?: A Lot Help needed to walk in hospital room?: A Lot Help needed climbing 3-5 steps with a railing? : A Lot 6 Click Score: 13    End of Session Equipment Utilized During Treatment: Gait belt Activity Tolerance: Patient tolerated treatment well;Patient limited by fatigue Patient left: in chair;with call bell/phone within reach;with chair alarm set;with family/visitor present Nurse Communication: Mobility status PT Visit Diagnosis: Unsteadiness on feet (R26.81);Other abnormalities of gait and mobility (R26.89);Muscle weakness (generalized) (M62.81)     Time: 0865-7846 PT Time Calculation (min) (ACUTE ONLY): 26 min  Charges:  $Gait Training: 8-22 mins $Therapeutic Activity: 8-22 mins                     3:35 PM, 02/16/20 Ocie Bob, MPT Physical Therapist with  [facility] 336 [phone] office [phone] mobile phone

## 2020-02-16 NOTE — Progress Notes (Signed)
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 Hepresented to the [facility] ED on 09/28/2019 with altered mental status and fever of 104 F, WBC 17.4 and lactate 4.7 after a day of generalized malaise, nausea, vomiting, weaknessand headache. He had elevated troponins over 5000.Due to combativeness, he required sedation and subsequent intubation and [facility] and diagnosed with pneumococcal meningitis. He was started on ceftriaxone. Due to possible seizure, he was started on Keppra and placed on continuous EEG which was negative for seizure activity. 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 He received a 5 day course of SoluMedrol for cerebral edema. He then developed exteensor posturing with tachycardia and hypertension concerning for elevated intracranial pressure. He underwent Bolt ICP monitoring which demonstrated no elevated ICP. Follow up CT head and subsequent repeat MRI of brain showed small subacute hemorrhage in the right suboccipital lobe, either hemorrhagic conversion of small ischemic stroke vs extension of previously seen microhemorrhage, as well as hypoxic injury involving the bilateral caudate and putamen. He also sustained a NSTEMI. He was extubated on 10/10/2019 and underwent slow taper of Dilaudid to minimize withdrawal symptoms. A G-tube was inserted on 10/16/2019 and he was discharged on 10/18/2019 under the care of his mother who is a CNA.   He was brought to hospital with altered mental status.  Assessment/Plan: Acuteon chronicmetabolic encephalopathy--- worsening behavioral problems superimposed on underlying anoxic brain  injury -Abilify 5 mg twice dailydiscontinued 02/03/20 -Continue Lamictal 100 mg daily -Keppra was recently discontinued as patient's EEG was nonepileptiformand may have been contributing to increased agitation -Previously discussed the risks/benefits of seroquel including its "black box warning" with patient's mother  -she expresses understanding in light of the patient's behavioral issues -She expresses that other medications and change of venue/scenery have been tried to try to provide care for the patient -in light of her inability to care for the patient and failure of other modalities, she accepts the risks with seroquel in hopes that his behavior can be controlled to allow for care of the patient. -continue to follow rec's by psychiatry/TTS service -Continue valium bid -will continue current adjusted seroquel dosages (300 mg am and 400 hs) -Consult reorientation and supportive care.  -overall now more re-directable and following commands; behaviors have improved significantly after adjustment of medications -Patient remains medically stable, he is awaiting placement to appropriate facility as patient's mother who is a trained CNA is no longer able to manage him at home.  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   -eating well since PEG removed. -patient will continue therapy by speech therapy.  SVT/HTN -patient had intermittent episodes of SVT -02/08/20-had episode of SVT -continue metoprolol to 37.5 mg bid -Heart rate stable since last metoprolol dose adjustment..  Social/Ethics -patient's mother was a trained CNA is no longer able  to manage him at home due to escalating behaviors --Patient remains total care- He does not talk much. He is still incontinent. He requires assistance with essential ADLs such as bathing,  dressing and using toilet -Familydoes not feel that they can safely manage the patient at home and arerequesting placement to facility. -Social work consultation to help with placement requested  chronic anemia/iron deficiency anemia -multifactorial , partly nutritional, expect further drop in H&H with hydration/hemodilution -iron sat--9% -folate 7.1 -B12--2310 -continue iron and folate. -will attempt to repeat cbc in am.   Disposition/Need for in-Hospital Stay- patient unable to be discharged at this time due to -behavioral problems requiring placement to appropriate facility --At this time patient does not have a safe discharge plan, -Awaiting placement to appropriate facility as patient's mother who is a trained CNA is no longer able to manage him at home.    Status is: Inpatient   Dispo: The patient is from:Home Anticipated d/c is HA:[id number] [facility] Anticipated d/c date is: to be determined Patient currently is medically stable to d/c.     Family Communication:noFamily at bedside  Consultants:psychiatry  Code Status: FULL   DVT Prophylaxis:xarelto   Procedures: As Listed in Progress Note Above  Antibiotics: None   Subjective: No fever, no CP, no nausea, no vomiting. Patient has remained stable and cooperative with staff. No acute distress or overnight events reported.   Objective: Vitals:   02/15/20 1500 02/15/20 2139 02/16/20 0500 02/16/20 0622  BP: 127/76 115/68  129/79  Pulse: 84 82  80  Resp: 19 20  17   Temp: 98.2 F (36.8 C) 98.6 F (37 C)  98.7 F (37.1 C)  TempSrc: Oral Oral  Oral  SpO2: 100% 100%  100%  Weight:   96.3 kg   Height:        Intake/Output Summary (Last 24  hours) at 02/16/2020 0914 Last data filed at 02/15/2020 1855 Gross per 24 hour  Intake 1320 ml  Output 800 ml  Net 520 ml   Weight change:   Exam: General exam: Alert, awake, afebrile and in no distress. No overnight events reported.  Respiratory system: Clear to auscultation. Respiratory effort normal.no using accessory muscles. Good O2 sat on RA. Cardiovascular system:RRR. No murmurs, rubs, gallops. No JVD Gastrointestinal system: Abdomen is nondistended, soft and nontender. No organomegaly or masses felt. Normal bowel sounds heard. Central nervous system: No new focal neurological deficits. Extremities: No cyanosis, no clubbing. Skin: No rashes, no petechiae. Psychiatry: Mood & affect appropriate.    Data Reviewed: I have personally reviewed following labs and imaging studies  Urine analysis:    Component Value Date/Time   COLORURINE YELLOW 01/01/2020 2133   APPEARANCEUR HAZY (A) 01/01/2020 2133   LABSPEC 1.003 (L) 01/01/2020 2133   PHURINE 6.0 01/01/2020 2133   GLUCOSEU NEGATIVE 01/01/2020 2133   HGBUR LARGE (A) 01/01/2020 2133   BILIRUBINUR NEGATIVE 01/01/2020 2133   KETONESUR 20 (A) 01/01/2020 2133   PROTEINUR 30 (A) 01/01/2020 2133   NITRITE NEGATIVE 01/01/2020 2133   LEUKOCYTESUR LARGE (A) 01/01/2020 2133   Scheduled Meds: . diazepam  5 mg Oral Q12H  . feeding supplement  237 mL Oral BID BM  . ferrous sulfate  325 mg Oral Q breakfast  . folic acid  1 mg Oral Daily  . lamoTRIgine  100 mg Oral Daily  . metoprolol tartrate  37.5 mg Oral BID  . OXcarbazepine  150 mg Oral BID  . polyethylene glycol  17 g Oral Daily  . QUEtiapine  300 mg Oral q AM  . QUEtiapine  400 mg Oral QHS  . rivaroxaban  10 mg Oral Q supper  . senna-docusate  2 tablet Oral BID  .  thiamine  100 mg Oral Daily   Continuous Infusions:  Procedures/Studies: No results found.  Vassie Loll, MD [facility] hospital[facility]  02/16/2020, 9:14 AM   LOS: 46 days

## 2020-02-17 LAB — BASIC METABOLIC PANEL
Anion gap: 10 (ref 5–15)
BUN: 14 mg/dL (ref 6–20)
CO2: 28 mmol/L (ref 22–32)
Calcium: 9.4 mg/dL (ref 8.9–10.3)
Chloride: 98 mmol/L (ref 98–111)
Creatinine, Ser: 0.72 mg/dL (ref 0.61–1.24)
GFR, Estimated: >60 mL/min (ref >60)
Glucose, Bld: 110 mg/dL — ABNORMAL HIGH (ref 70–99)
Potassium: 4.2 mmol/L (ref 3.5–5.1)
Sodium: 136 mmol/L (ref 135–145)

## 2020-02-17 LAB — CBC
HCT: 36.6 % — ABNORMAL LOW (ref 39.0–52.0)
Hemoglobin: 11.3 g/dL — ABNORMAL LOW (ref 13.0–17.0)
MCH: 29.5 pg (ref 26.0–34.0)
MCHC: 30.9 g/dL (ref 30.0–36.0)
MCV: 95.6 fL (ref 80.0–100.0)
Platelets: 299 10*3/uL (ref 150–400)
RBC: 3.83 MIL/uL — ABNORMAL LOW (ref 4.22–5.81)
RDW: 14.6 % (ref 11.5–15.5)
WBC: 10.1 10*3/uL (ref 4.0–10.5)
nRBC: 0 % (ref 0.0–0.2)

## 2020-02-17 NOTE — Progress Notes (Signed)
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 Hepresented to the [facility] ED on 09/28/2019 with altered mental status and fever of 104 F, WBC 17.4 and lactate 4.7 after a day of generalized malaise, nausea, vomiting, weaknessand headache. He had elevated troponins over 5000.Due to combativeness, he required sedation and subsequent intubation [facility] with pneumococcal [facility]. He was started on ceftriaxone. Due to possible seizure, he was started on Keppra and placed on continuous EEG which was negative for seizure activity. 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 He received a 5 day course of SoluMedrol for cerebral edema. He then developed exteensor posturing with tachycardia and hypertension concerning for elevated intracranial pressure. He underwent Bolt ICP monitoring which demonstrated no elevated ICP. Follow up CT head and subsequent repeat MRI of brain showed small subacute hemorrhage in the right suboccipital lobe, either hemorrhagic conversion of small ischemic stroke vs extension of previously seen microhemorrhage, as well as hypoxic injury involving the bilateral caudate and putamen. He also sustained a NSTEMI. He was extubated on 10/10/2019 and underwent slow taper of Dilaudid to minimize withdrawal symptoms. A G-tube was inserted on 10/16/2019 and he was discharged on 10/18/2019 under the care of his mother who is a CNA.   He was brought to hospital with altered mental status.  Assessment/Plan: Acuteon chronicmetabolic encephalopathy--- worsening behavioral problems superimposed on underlying anoxic brain  injury -Abilify 5 mg twice dailydiscontinued 02/03/20 -Continue Lamictal 100 mg daily -Keppra was recently discontinued as patient's EEG was nonepileptiformand may have been contributing to increased agitation -Previously discussed the risks/benefits of seroquel including its "black box warning" with patient's mother  -she expresses understanding in light of the patient's behavioral issues -She expresses that other medications and change of venue/scenery have been tried to try to provide care for the patient -in light of her inability to care for the patient and failure of other modalities, she accepts the risks with seroquel in hopes that his behavior can be controlled to allow for care of the patient. -continue to follow rec's by psychiatry/TTS service -Continue valium bid -will continue current adjusted seroquel dosages (300 mg am and 400 hs) -Consult reorientation and supportive care.  -overall now more re-directable and following commands; behaviors have improved significantly after adjustment of medications -Patient remains medically stable, he has now been declined by [facility].  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   -eating well since PEG removed. -patient will continue therapy by speech therapy.  SVT/HTN -patient had intermittent episodes of SVT -02/08/20-had episode of SVT -continue metoprolol to 37.5 mg bid -Heart rate stable since last metoprolol dose adjustment..  Social/Ethics -patient's mother was a trained CNA is no longer able to manage him at home due to escalating behaviors --Patient remains total care- He does not  talk much. He is still incontinent. He requires assistance with essential ADLs such as bathing, dressing and using toilet -Familydoes not feel that they can safely manage the patient at home and  arerequesting placement to facility. -Social work consultation to help with placement requested  chronic anemia/iron deficiency anemia -multifactorial , partly nutritional, expect further drop in H&H with hydration/hemodilution -iron sat--9% -folate 7.1 -B12--2310 -continue iron and folate. -will attempt to repeat cbc in am.   Disposition/Need for in-Hospital Stay- patient unable to be discharged at this time due to -behavioral problems requiring placement to appropriate facility --At this time patient does not have a safe discharge plan, -Awaiting placement to appropriate facility as patient's mother who is a trained CNA was having significant difficulty managing in prior to admission. --Patient fianc trying to join forces with patient's mother to look after his care after discharge now that his behavior has improved.    Status is: Inpatient   Dispo: The patient is from:Home Anticipated d/c is AY:[facility] Anticipated d/c date is: to be determined Patient currently is medically stable to d/c.  After discussing with TOC family will try to join forces to current obstruction at home now that his behavior has been better controlled.  Tinea observation and assistance with final decision by 02/19/2020.     Family Communication:noFamily at bedside today.  Consultants:psychiatry  Code Status: FULL   DVT Prophylaxis:xarelto   Procedures: As Listed in Progress Note Above  Antibiotics: None   Subjective: No fever, no chest pain, no nausea, no vomiting.  Patient continued to be cooperative with staff and is in no acute distress.  No agitation or combative behavior has been reported.  Taking medications  appropriately and participating with physical therapy.  Objective: Vitals:   02/16/20 0622 02/16/20 1326 02/16/20 1516 02/17/20 0500  BP: 129/79 116/79 120/74 122/77  Pulse: 80 91 88 95  Resp: 17  18 16   Temp: 98.7 F (37.1 C)  100 F (37.8 C)   TempSrc: Oral  Oral   SpO2: 100%  100% 100%  Weight:      Height:        Intake/Output Summary (Last 24 hours) at 02/17/2020 1632 Last data filed at 02/17/2020 0800 Gross per 24 hour  Intake 600 ml  Output --  Net 600 ml   Weight change:   Exam: General exam: Alert, awake, following commands appropriately and in no acute distress.  No overnight events reported.  Patient continues to have good behavior and is cooperative with staff.  No chest pain, no nausea, no vomiting. Respiratory system: Clear to auscultation. Respiratory effort normal.  No using accessory muscle. Cardiovascular system:RRR. No murmurs, rubs, gallops.  No JVD. Gastrointestinal system: Abdomen is nondistended, soft and nontender. No organomegaly or masses felt. Normal bowel sounds heard. Central nervous system: No new focal neurological deficits. Extremities: No cyanosis, clubbing or edema. Skin: No rashes, no petechiae. Psychiatry: Mood & affect appropriate.     Data Reviewed: I have personally reviewed following labs and imaging studies  Urine analysis:    Component Value Date/Time   COLORURINE YELLOW 01/01/2020 2133   APPEARANCEUR HAZY (A) 01/01/2020 2133   LABSPEC 1.003 (L) 01/01/2020 2133   PHURINE 6.0 01/01/2020 2133   GLUCOSEU NEGATIVE 01/01/2020 2133   HGBUR LARGE (A) 01/01/2020 2133   BILIRUBINUR NEGATIVE 01/01/2020 2133   KETONESUR 20 (A) 01/01/2020 2133   PROTEINUR 30 (A) 01/01/2020 2133   NITRITE NEGATIVE 01/01/2020 2133   LEUKOCYTESUR LARGE (A) 01/01/2020 2133   Scheduled Meds: . diazepam  5 mg Oral Q12H  . feeding supplement  237 mL Oral BID BM  . ferrous sulfate  325 mg Oral Q breakfast  .  folic acid  1 mg Oral Daily  . lamoTRIgine   100 mg Oral Daily  . metoprolol tartrate  37.5 mg Oral BID  . OXcarbazepine  150 mg Oral BID  . polyethylene glycol  17 g Oral Daily  . QUEtiapine  300 mg Oral q AM  . QUEtiapine  400 mg Oral QHS  . rivaroxaban  10 mg Oral Q supper  . senna-docusate  2 tablet Oral BID  . thiamine  100 mg Oral Daily   Continuous Infusions:  Procedures/Studies: No results found.  Vassie Loll, MD [facility] hospitalist  02/17/2020, 4:32 PM   LOS: 47 days

## 2020-02-17 NOTE — Progress Notes (Signed)
Nutrition Follow-up  DOCUMENTATION CODES:   Not applicable  INTERVENTION:  Continue Ensure Enlive po BID, each supplement provides 350 kcal and 20 grams of protein  Continue Magic cup BID with meals, each supplement provides 290 kcal and 9 grams of protein  Continue to encourage po intake of meals and supplements   Recommend obtaining standing weight as able, question accuracy of recent wt history  NUTRITION DIAGNOSIS:   Inadequate oral intake related to inability to eat as evidenced by  (PEG tube dependent for nutrition/hydration and medications s/p multiple subacute strokes with residual weakness secondary to recent bacterial meningitis). -Resolved, continues to tolerate oral diet, PEG removed 11/15  GOAL:   Patient will meet greater than or equal to 90% of their needs -Meeting with po intake of meals and supplements  MONITOR:   PO intake,Weight trends,Labs,Diet advancement,I & O's  REASON FOR ASSESSMENT:   Rounds    ASSESSMENT:   39 year old male with history of recent hospitalization for bacterial meningitis (7/24-8/14). 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 Pt presented with progressively worsening mental status, increased agitation and family reports unable to safely manage him at home.  Patient sitting up in bed watching television this morning. Pleasant and cooperative, reports eating well, unable to recall what he had for breakfast. He endorses drinking supplements, laughs and states "yeah yeah I like those" RD encouraged continued po intake of meals and supplements.   Meal intake 100% of the last 8 documented meals (12/11-12/14)  Admit wt 83.2 kg          Current wt 96.3 kg  Per chart, weights have trended up ~13 lbs in the last week; suspect this is an inaccurate bed weight. Recommend obtaining standing weight if able.    Medications reviewed and include: Valium, Ferrous sulfate, Folic acid, Miralax, Seroquel, Senokot, Thiamine  Labs reviewed  Diet Order:   Diet Order            Diet regular Room service appropriate? Yes; Fluid consistency: Thin  Diet effective now                 EDUCATION NEEDS:   Education needs have been addressed  Skin:  Skin Assessment: Skin Integrity Issues: Skin Integrity Issues:: Other (Comment) Other: MASD;buttocks;left  Last BM:  12/13  Height:   Ht Readings from Last 1 Encounters:  01/02/20 6\' 2"  (1.88 m)    Weight:   Wt Readings from Last 1 Encounters:  02/16/20 96.3 kg    BMI:  Body mass index is 27.26 kg/m.  Estimated Nutritional Needs:   Kcal:  2080-2330  Protein:  105-115  Fluid:  >/= 2 L/day   06-10-1970, RD, LDN Clinical Nutrition After Hours/Weekend Pager # in Amion

## 2020-02-17 NOTE — TOC Progression Note (Signed)
Transition of Care [facility]) - Progression Note    Patient Details  Name: Isaac [name] MRN: [id number] Date of Birth: [birth year]  Transition of [facility]) CM/SW Contact  Annice Needy, LCSW Phone Number: 02/17/2020, 11:14 AM  Clinical Narrative:    Spoke with Ms. Katrinka Blazing, patient's mother. Discussed that placement has not been located for patient despite continual efforts. Discussed possibility of patient going home. Mother states that she and patient's fiance have been discussing patient's return home as they have noticed improved behaviors. Fiance is with patient today and tomorrow. Mother will be with patient on Thursday. Mother will call TOC back on Thursday as she is hopeful that patient can return home by Christmas.      Barriers to Discharge: No SNF bed,Psych Bed not available  Expected Discharge Plan and Services           Expected Discharge Date: 01/26/20                                     Social Determinants of Health (SDOH) Interventions    Readmission Risk Interventions No flowsheet data found.

## 2020-02-17 NOTE — Plan of Care (Signed)

## 2020-02-18 NOTE — TOC Progression Note (Addendum)
Transition of Care [facility]) - Progression Note    Patient Details  Name: Isaac [name] MRN: [id number] Date of Birth: 1980-10-04  Transition of Care [facility]) CM/SW Contact  Annice Needy, LCSW Phone Number: 02/18/2020, 2:39 PM  Clinical Narrative:    Spoke with patient's mother and fiance in separate phone calls.  It has been decided that patient will discharge home on 02/25/20. LCSW discussed that patient had remained stable for discharge and inquired about potential discharge this week. Fiance, Ms. Stephannie Peters, indicated that she had "a lot of stuff to do Monday and Tuesday" and Wednesday would be the earliest day that she could receive patient.  She Indicated that she was getting prepared for him. LCSW inquired as to anything that [facility] could do to assist in preparation (I.e. equipment) and Ms. Stephannie Peters said there wasn't.  She indicated that she needed to clean patient's room out and get him in order. Discussed that as long as patient remained medically stable, the plan would be for discharge to fiance's home at [address] [place], [place] [address], on 02/25/20. It was also discussed that an APS report may have to be made regarding patient should he have to remain hospitalized due to having no where to go.  Discussed PCS services with mother. Agreeable to services. PCS paperwork completed and faxed in to [facility] who is the clearing house of PCS services.  Fax received from [facility] indicating that patient had managed Medicaid and his PCS services would have to be authorized through the health plan. Called [employer] and was advised that patient's Managed Medicaid is with [facility].  Contact made with [facility] Managed Medicaid at [phone]. Call Reference is [phone]. PENDING AUTH NO is [id number] with a start date of 02/25/20 for [facility] services. Patient clinicals faxed to [phone] for auth approval for [facility] services.    Barriers to Discharge: No SNF bed,Psych Bed not available  Expected  Discharge Plan and Services           Expected Discharge Date: 01/26/20                                     Social Determinants of Health (SDOH) Interventions    Readmission Risk Interventions No flowsheet data found.

## 2020-02-18 NOTE — Progress Notes (Signed)
Physical Therapy Treatment Patient Details Name: Isaac [name] MRN: [id number] DOB: 1980-05-04 Today's Date: 02/18/2020    History of Present Illness Isaac [name] is a 39 y.o. male with medical history significant of history of bacterial meningitis about 3 months ago, NSTEMI due to demand ischemia during his meningitis hospitalization (see EDP note for attached discharge summary) who is brought to the emergency department by family members due to the patient's worsening mental status.  The patient's mental status has been progressively worse.  He was seen at [facility] about 4 days ago and given Geodon due to restlessness, but subsequently discharged home.  His mother has been having a very difficult time taking care of him at home.  Neurology has been trying to assist in long-term placement, but the patient is uninsured and his mother is unable to afford it.  Neurology recently discontinue Keppra and started on Lamictal to control his agitation.  He has also been given Seroquel without significant results.    PT Comments    Patient presents seated in chair (assisted by nursing staff) and agreeable for therapy - his fiancee present in room.  Patient requires slightly less assistance for gait training and safe enough to gait train with therapist at side instead of leading from the front, patient continues to veer to the left requiring RW held in place to avoid bumping into objects on left side, required sitting rest break in wheelchair before returning to room, patient's fiancee assisted with following with wheelchair during gait training.  Patient tolerated staying up in chair after therapy with his fiancee in room.  Patient will benefit from continued physical therapy in hospital and recommended venue below to increase strength, balance, endurance for safe ADLs and gait.    Follow Up Recommendations  SNF     Equipment Recommendations  Rolling walker with 5" wheels    Recommendations for  Other Services       Precautions / Restrictions Precautions Precautions: Fall Restrictions Weight Bearing Restrictions: No    Mobility  Bed Mobility               General bed mobility comments: Patient presents up in chair (assisted by nursing staff)  Transfers Overall transfer level: Needs assistance Equipment used: Rolling walker (2 wheeled) Transfers: Sit to/from [employer] Sit to Stand: Min assist;Mod assist Stand pivot transfers: Mod assist       General transfer comment: demonstrates incresed BLE strength for sit to stands  Ambulation/Gait Ambulation/Gait assistance: Min assist;Mod assist Gait Distance (Feet): 70 Feet Assistive device: Rolling walker (2 wheeled) Gait Pattern/deviations: Decreased step length - right;Decreased step length - left;Decreased stride length;Drifts right/left;Trunk flexed Gait velocity: near normal   General Gait Details: Patient requires slightly less assistance for ambulation, able to assist patient by standing at side instead of leading in front, continues to veer to the left requiring helper to hold onto RW to keep straight, limited secondary to fatigue and required sitting rest break before ambulating back to room   Stairs             Wheelchair Mobility    Modified Rankin (Stroke Patients Only)       Balance Overall balance assessment: Needs assistance Sitting-balance support: Feet supported;No upper extremity supported Sitting balance-Leahy Scale: Fair Sitting balance - Comments: fair/good seated in chair   Standing balance support: During functional activity;Bilateral upper extremity supported Standing balance-Leahy Scale: Fair Standing balance comment: using RW  Cognition Arousal/Alertness: Awake/alert Behavior During Therapy: WFL for tasks assessed/performed Overall Cognitive Status: History of cognitive impairments - at baseline                                  General Comments: Patient agreeable and motivated for therapy      Exercises General Exercises - Lower Extremity Long Arc Quad: Seated;AAROM;Strengthening;Both;10 reps Hip Flexion/Marching: Seated;AAROM;Strengthening;Both;10 reps    General Comments        Pertinent Vitals/Pain Pain Assessment: No/denies pain    Home Living                      Prior Function            PT Goals (current goals can now be found in the care plan section) Acute Rehab PT Goals Patient Stated Goal: return home PT Goal Formulation: With patient Time For Goal Achievement: 03/03/20 Potential to Achieve Goals: Good Progress towards PT goals: Progressing toward goals    Frequency    Min 4X/week      PT Plan Current plan remains appropriate    Co-evaluation              AM-PAC PT "6 Clicks" Mobility   Outcome Measure  Help needed turning from your back to your side while in a flat bed without using bedrails?: A Little Help needed moving from lying on your back to sitting on the side of a flat bed without using bedrails?: A Lot Help needed moving to and from a bed to a chair (including a wheelchair)?: A Lot Help needed standing up from a chair using your arms (e.g., wheelchair or bedside chair)?: A Lot Help needed to walk in hospital room?: A Lot Help needed climbing 3-5 steps with a railing? : A Lot 6 Click Score: 13    End of Session Equipment Utilized During Treatment: Gait belt Activity Tolerance: Patient tolerated treatment well;Patient limited by fatigue Patient left: in chair;with call bell/phone within reach;with chair alarm set;with family/visitor present Nurse Communication: Mobility status PT Visit Diagnosis: Unsteadiness on feet (R26.81);Other abnormalities of gait and mobility (R26.89);Muscle weakness (generalized) (M62.81)     Time: 7672-0947 PT Time Calculation (min) (ACUTE ONLY): 27 min  Charges:  $Gait Training: 8-22  mins $Therapeutic Activity: 8-22 mins                     2:58 PM, 02/18/20 Ocie Bob, MPT Physical Therapist with [facility] [phone] office [phone] mobile phone

## 2020-02-18 NOTE — Progress Notes (Signed)
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 Hepresented to the [facility] ED on 09/28/2019 with altered mental status and fever of 104 F, WBC 17.4 and lactate 4.7 after a day of generalized malaise, nausea, vomiting, weaknessand headache. He had elevated troponins over 5000.Due to combativeness, he required sedation and subsequent intubation [facility] with [facility]. He was started on ceftriaxone. Due to possible seizure, he was started on Keppra and placed on continuous EEG which was negative for seizure activity. 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 He received a 5 day course of SoluMedrol for cerebral edema. He then developed exteensor posturing with tachycardia and hypertension concerning for elevated intracranial pressure. He underwent Bolt ICP monitoring which demonstrated no elevated ICP. Follow up CT head and subsequent repeat MRI of brain showed small subacute hemorrhage in the right suboccipital lobe, either hemorrhagic conversion of small ischemic stroke vs extension of previously seen microhemorrhage, as well as hypoxic injury involving the bilateral caudate and putamen. He also sustained a NSTEMI. He was extubated on 10/10/2019 and underwent slow taper of Dilaudid to minimize withdrawal symptoms. A G-tube was inserted on 10/16/2019 and he was discharged on 10/18/2019 under the care of his mother who is a CNA.   He was brought to hospital with altered mental status and combative behavior..  Assessment/Plan: Acuteon chronicmetabolic encephalopathy--- worsening behavioral problems superimposed on underlying  anoxic brain injury -Abilify 5 mg twice dailydiscontinued 02/03/20 -Continue Lamictal 100 mg daily -Keppra was recently discontinued as patient's EEG was nonepileptiformand may have been contributing to increased agitation -Previously discussed the risks/benefits of seroquel including its "black box warning" with patient's mother  -she expresses understanding in light of the patient's behavioral issues -She expresses that other medications and change of venue/scenery have been tried to try to provide care for the patient -in light of her inability to care for the patient and failure of other modalities, she accepts the risks with seroquel in hopes that his behavior can be controlled to allow for care of the patient. -continue to follow rec's by psychiatry/TTS service -Continue valium bid -will continue current adjusted seroquel dosages (300 mg am and 400 hs) -Consult reorientation and supportive care.  -overall now more re-directable and following commands; behaviors have improved significantly after adjustment of medications -Patient remains medically stable, he has now been declined by [facility] (as he will not meet criteria to need inpatient services at this time).  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   -eating well since PEG removed. -patient will continue therapy by speech therapy.  SVT/HTN -patient had intermittent episodes of SVT -02/08/20-had episode of SVT -continue metoprolol to 37.5 mg bid -Heart rate stable since last metoprolol dose adjustment..  Social/Ethics -patient's mother was a trained CNA is no longer able to manage him  at home due to escalating behaviors --Patient remains total care- He does not talk much. He is still incontinent. He requires assistance with essential ADLs such as bathing, dressing  and using toilet -Familydoes not feel that they can safely manage the patient at home and arerequesting placement to facility. -Social work consultation to help with placement requested  chronic anemia/iron deficiency anemia -multifactorial , partly nutritional, expect further drop in H&H with hydration/hemodilution -iron sat--9% -folate 7.1 -B12--2310 -continue iron and folate. -will attempt to repeat cbc in am.   Disposition/Need for in-Hospital Stay- patient unable to be discharged at this time due to -behavioral problems requiring placement to appropriate facility --Atthis time patient does not have a safe discharge plan, -Awaiting placement to appropriate facility as patient's mother who is a trained CNA was having significant difficulty managing in prior to admission. --Patient fianc trying to join forces with patient's mother to look after his care after discharge now that his behavior has improved.    Status is: Inpatient   Dispo: The patient is from:Home Anticipated d/c is WU:[id number] Anticipated d/c date is: to be determined Patient currently is medically stable to d/c.  After discussing with TOC family will try to join forces to current obstruction at home now that his behavior has been better controlled.  Tinea observation and assistance with final decision by 02/19/2020.     Family Communication:noFamily at bedside today.  Consultants:psychiatry  Code Status: FULL   DVT Prophylaxis:xarelto   Procedures: As Listed in Progress Note Above  Antibiotics: None   Subjective: No fever, no chest pain, no nausea, no vomiting, no shortness of breath.  No agitation or combative behavior has been  reported.  Patient continues to be cooperative and in no acute distress.  Objective: Vitals:   02/17/20 0500 02/17/20 2018 02/18/20 0353 02/18/20 0439  BP: 122/77 125/75 114/80   Pulse: 95 (!) 101 90   Resp: 16  20   Temp:  98.9 F (37.2 C) 97.7 F (36.5 C)   TempSrc:   Oral   SpO2: 100% 100% 100%   Weight:    96.6 kg  Height:        Intake/Output Summary (Last 24 hours) at 02/18/2020 0811 Last data filed at 02/17/2020 1500 Gross per 24 hour  Intake --  Output 750 ml  Net -750 ml   Weight change:   Exam: General exam: Alert, afebrile, no chest pain, no nausea, no vomiting.  Patient has continue with good behavior and no overnight events reported. Respiratory system: Clear to auscultation. Respiratory effort normal.  No using accessory muscles. Cardiovascular system:RRR. No murmurs, rubs, gallops.  No JVD Gastrointestinal system: Abdomen is nondistended, soft and nontender. No organomegaly or masses felt. Normal bowel sounds heard. Central nervous system: No new focal neurological deficits. Extremities: No cyanosis or clubbing. Skin: No rashes, no petechiae Psychiatry: Mood & affect appropriate.   Data Reviewed: I have personally reviewed following labs and imaging studies  Urine analysis:    Component Value Date/Time   COLORURINE YELLOW 01/01/2020 2133   APPEARANCEUR HAZY (A) 01/01/2020 2133   LABSPEC 1.003 (L) 01/01/2020 2133   PHURINE 6.0 01/01/2020 2133   GLUCOSEU NEGATIVE 01/01/2020 2133   HGBUR LARGE (A) 01/01/2020 2133   BILIRUBINUR NEGATIVE 01/01/2020 2133   KETONESUR 20 (A) 01/01/2020 2133   PROTEINUR 30 (A) 01/01/2020 2133   NITRITE NEGATIVE 01/01/2020 2133   LEUKOCYTESUR LARGE (A) 01/01/2020 2133   Scheduled Meds: . diazepam  5 mg Oral Q12H  . feeding supplement  237 mL Oral BID BM  . ferrous sulfate  325 mg Oral Q breakfast  . folic acid  1 mg Oral Daily  .  lamoTRIgine  100 mg Oral Daily  . metoprolol tartrate  37.5 mg Oral BID  . OXcarbazepine   150 mg Oral BID  . polyethylene glycol  17 g Oral Daily  . QUEtiapine  300 mg Oral q AM  . QUEtiapine  400 mg Oral QHS  . rivaroxaban  10 mg Oral Q supper  . senna-docusate  2 tablet Oral BID  . thiamine  100 mg Oral Daily   Continuous Infusions: None   Procedures/Studies: No results found.  Vassie Loll, MD [facility] hospitalist  02/18/2020, 8:11 AM   LOS: 48 days

## 2020-02-19 NOTE — TOC Progression Note (Signed)
Transition of Care [facility]) - Progression Note    Patient Details  Name: Isaac [name] MRN: [id number] Date of Birth: 29-Jun-1980  Transition of Care [facility]) CM/SW Contact  Annice Needy, LCSW Phone Number: 02/19/2020, 9:08 AM  Clinical Narrative:    Call received from [facility] [phone], prior auth nurse with [facility], advising that the prior auth information received on 02/18/20 was not submitted to the correct department and that the prior auth needed to go through case management. Case management can be reached at [phone]. Bonita Quin advised that the CPT code for [facility] services is [phone]. Spoke with Christiane Ha in the Case management department. Advised that clinicals could be faxed to [phone] and the case management email was [email or web address].  Clinicals faxed.     Barriers to Discharge: No SNF bed,Psych Bed not available  Expected Discharge Plan and Services           Expected Discharge Date: 01/26/20                                     Social Determinants of Health (SDOH) Interventions    Readmission Risk Interventions No flowsheet data found.

## 2020-02-19 NOTE — Progress Notes (Signed)
Physical Therapy Treatment Patient Details Name: Isaac [name] MRN: [id number] DOB: Sep 21, 1980 Today's Date: 02/19/2020    History of Present Illness Isaac [name] is a 39 y.o. male with medical history significant of history of bacterial meningitis about 3 months ago, NSTEMI due to demand ischemia during his meningitis hospitalization (see EDP note for attached discharge summary) who is brought to the emergency department by family members due to the patient's worsening mental status.  The patient's mental status has been progressively worse.  He was seen at [facility] about 4 days ago and given Geodon due to restlessness, but subsequently discharged home.  His mother has been having a very difficult time taking care of him at home.  Neurology has been trying to assist in long-term placement, but the patient is uninsured and his mother is unable to afford it.  Neurology recently discontinue Keppra and started on Lamictal to control his agitation.  He has also been given Seroquel without significant results.    PT Comments    Patient demonstrates fair/good tolerance for ambulation with tendency to veer to the right today requiring tactile assistance to prevent patient from bumping into wall, patient's mother present and assisted with pushing w/c during gait traiing and received family training in transfers, gait and sit to stands by observation with understanding acknowledged.  Patient required sitting rest break before returning to room and tolerated staying up in chair with his Mother present in room - nursing staff aware.  Patient will benefit from continued physical therapy in hospital and recommended venue below to increase strength, balance, endurance for safe ADLs and gait.   Follow Up Recommendations  SNF     Equipment Recommendations  Rolling walker with 5" wheels    Recommendations for Other Services       Precautions / Restrictions Precautions Precautions:  Fall Restrictions Weight Bearing Restrictions: No    Mobility  Bed Mobility Overal bed mobility: Needs Assistance Bed Mobility: Supine to Sit     Supine to sit: Min assist;Mod assist     General bed mobility comments: Required Min/mod assist to move BLE and for scooting to EOB  Transfers Overall transfer level: Needs assistance Equipment used: Rolling walker (2 wheeled) Transfers: Sit to/from [employer] Sit to Stand: Min assist;Mod assist Stand pivot transfers: Mod assist       General transfer comment: increased time, labored movement  Ambulation/Gait Ambulation/Gait assistance: Min assist;Mod assist Gait Distance (Feet): 70 Feet Assistive device: Rolling walker (2 wheeled) Gait Pattern/deviations: Decreased step length - right;Decreased step length - left;Decreased stride length;Drifts right/left;Trunk flexed Gait velocity: near normal   General Gait Details: Patient requires less assistance for ambulation, able to assist patient by standing at side instead of leading in front, veering mostly to the right today requiring helper to hold onto RW to keep straight, limited secondary to fatigue and required sitting rest break before ambulating back to room   Stairs             Wheelchair Mobility    Modified Rankin (Stroke Patients Only)       Balance Overall balance assessment: Needs assistance Sitting-balance support: Feet supported;No upper extremity supported Sitting balance-Leahy Scale: Fair Sitting balance - Comments: fair/good seated in chair   Standing balance support: During functional activity;Bilateral upper extremity supported Standing balance-Leahy Scale: Fair Standing balance comment: using RW  Cognition Arousal/Alertness: Awake/alert Behavior During Therapy: WFL for tasks assessed/performed Overall Cognitive Status: History of cognitive impairments - at baseline                                  General Comments: Patient agreeable and motivated for therapy      Exercises General Exercises - Lower Extremity Long Arc Quad: Seated;AAROM;Strengthening;Both;10 reps Hip Flexion/Marching: Seated;AAROM;Strengthening;Both;10 reps    General Comments        Pertinent Vitals/Pain Pain Assessment: No/denies pain    Home Living                      Prior Function            PT Goals (current goals can now be found in the care plan section) Acute Rehab PT Goals Patient Stated Goal: return home PT Goal Formulation: With patient Time For Goal Achievement: 03/03/20 Potential to Achieve Goals: Good Progress towards PT goals: Progressing toward goals    Frequency    Min 4X/week      PT Plan Current plan remains appropriate    Co-evaluation PT/OT/SLP Co-Evaluation/Treatment: Yes            AM-PAC PT "6 Clicks" Mobility   Outcome Measure  Help needed turning from your back to your side while in a flat bed without using bedrails?: A Little Help needed moving from lying on your back to sitting on the side of a flat bed without using bedrails?: A Lot Help needed moving to and from a bed to a chair (including a wheelchair)?: A Lot Help needed standing up from a chair using your arms (e.g., wheelchair or bedside chair)?: A Lot Help needed to walk in hospital room?: A Lot Help needed climbing 3-5 steps with a railing? : A Lot 6 Click Score: 13    End of Session Equipment Utilized During Treatment: Gait belt Activity Tolerance: Patient tolerated treatment well;Patient limited by fatigue Patient left: in chair;with call bell/phone within reach;with chair alarm set;with family/visitor present Nurse Communication: Mobility status PT Visit Diagnosis: Unsteadiness on feet (R26.81);Other abnormalities of gait and mobility (R26.89);Muscle weakness (generalized) (M62.81)     Time: 7672-0947 PT Time Calculation (min) (ACUTE ONLY): 28  min  Charges:  $Gait Training: 8-22 mins $Therapeutic Activity: 8-22 mins                     2:12 PM, 02/19/20 Ocie Bob, MPT Physical Therapist with [facility] [phone] office [phone] mobile phone

## 2020-02-19 NOTE — Progress Notes (Signed)
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 Hepresented to the [facility] ED on 09/28/2019 with altered mental status and fever of 104 F, WBC 17.4 and lactate 4.7 after a day of generalized malaise, nausea, vomiting, weaknessand headache. He had elevated troponins over 5000.Due to combativeness, he required sedation and subsequent intubation [facility] and diagnosed with pneumococcal meningitis. He was started on ceftriaxone. Due to possible seizure, he was started on Keppra and placed on continuous EEG which was negative for seizure activity. 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 He received a 5 day course of SoluMedrol for cerebral edema. He then developed exteensor posturing with tachycardia and hypertension concerning for elevated intracranial pressure. He underwent Bolt ICP monitoring which demonstrated no elevated ICP. Follow up CT head and subsequent repeat MRI of brain showed small subacute hemorrhage in the right suboccipital lobe, either hemorrhagic conversion of small ischemic stroke vs extension of previously seen microhemorrhage, as well as hypoxic injury involving the bilateral caudate and putamen. He also sustained a NSTEMI. He was extubated on 10/10/2019 and underwent slow taper of Dilaudid to minimize withdrawal symptoms. A G-tube was inserted on 10/16/2019 and he was discharged on 10/18/2019 under the care of his mother who is a CNA.   He was brought to hospital with altered mental status and combative behavior..  Assessment/Plan: Acuteon chronicmetabolic encephalopathy--- worsening behavioral problems superimposed on underlying anoxic brain  injury -Abilify 5 mg twice dailydiscontinued 02/03/20 -Continue Lamictal 100 mg daily -Keppra was recently discontinued as patient's EEG was nonepileptiform -discussed the risks/benefits of seroquel including its "black box warning" with patient's mother -- accepts the risks with seroquel in hopes that his behavior can be controlled to allow for care of the patient. -continue to follow rec's by psychiatry/TTS service -Continue valium bid -will continue current adjusted seroquel dosages (300 mg am and 400 hs) -Consult reorientation and supportive care.  -overall now more re-directable and following commands; behaviors have improved significantly after adjustment of medications -Patient remains medically stable, he has now been declined by [facility] (as he will not meet criteria to need inpatient services at this time).  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   -eating well since PEG removed. -patient will continue therapy by speech therapy.  SVT/HTN -patient had intermittent episodes of SVT -02/08/20-had episode of SVT -continue metoprolol to 37.5 mg bid -Heart rate stable since last metoprolol dose adjustment..  Social/Ethics -patient's mother was a trained CNA is no longer able to manage him at home due to escalating behaviors --Patient remains total care- He does not talk much. He is still incontinent. He requires assistance with essential ADLs such as bathing, dressing and using toilet -Familywilling to accept patient home after coordination over weekend--confirmed with mother  chronic anemia/iron deficiency anemia -multifactorial ,  partly nutritional, expect further drop in H&H with hydration/hemodilution -iron sat--9% -folate 7.1 -B12--2310 -continue iron and folate. -will attempt to repeat cbc in am.   Disposition/Need for in-Hospital Stay- patient unable to  be discharged at this time due to -behavioral problems requiring placement to appropriate facility --Atthis time patient does not have a safe discharge plan, -Awaiting placement to appropriate facility as patient's mother who is a trained CNA was having significant difficulty managing in prior to admission. --Patient fianc trying to join forces with patient's mother to look after his care after discharge now that his behavior has improved.    Status is: Inpatient   Dispo: The patient is from:Home Anticipated d/c is JO:[facility] Anticipated d/c date is: to be determined Patient currently is medically stable to d/c.  After discussing with TOC family will try to join forces to current obstruction at home now that his behavior has been better controlled.  Tinea observation and assistance with final decision by 02/19/2020.     Family Communication:noFamily at bedside today.  Consultants:psychiatry  Code Status: FULL   DVT Prophylaxis:xarelto   Procedures: As Listed in Progress Note Above  Antibiotics: None   Subjective:  Fair Not combative no distress but sleepy afte reating No cp   Objective: Vitals:   02/18/20 0439 02/18/20 1447 02/19/20 0604 02/19/20 1418  BP:  124/79 125/74 112/73  Pulse:  99 (!) 105 (!) 104  Resp:  19 19 18   Temp:  98.1 F (36.7 C) 98.1 F (36.7 C) 97.9 F (36.6 C)  TempSrc:  Oral    SpO2:  100% 100% 100%  Weight: 96.6 kg     Height:       No intake or output data in the 24 hours ending 02/19/20 1638 Weight change:   Exam: Alert coherent in nad  Poorly responsive to me--but arouses a bit ctab no adde dsoudn abd soft nt dn  no rebound no guard No le edema s1 s 2no m/r/g  Data Reviewed: I have personally reviewed following labs and imaging studies  Urine analysis:    Component Value Date/Time   COLORURINE YELLOW 01/01/2020 2133   APPEARANCEUR HAZY (A) 01/01/2020 2133   LABSPEC 1.003 (L) 01/01/2020 2133   PHURINE 6.0 01/01/2020 2133   GLUCOSEU NEGATIVE 01/01/2020 2133   HGBUR LARGE (A) 01/01/2020 2133   BILIRUBINUR NEGATIVE 01/01/2020 2133   KETONESUR 20 (A) 01/01/2020 2133   PROTEINUR 30 (A) 01/01/2020 2133   NITRITE NEGATIVE 01/01/2020 2133   LEUKOCYTESUR LARGE (A) 01/01/2020 2133   Scheduled Meds: . diazepam  5 mg Oral Q12H  . feeding supplement  237 mL Oral BID BM  . ferrous sulfate  325 mg Oral Q breakfast  . folic acid  1 mg Oral Daily  . lamoTRIgine  100 mg Oral Daily  . metoprolol tartrate  37.5 mg Oral BID  . OXcarbazepine  150 mg Oral BID  . polyethylene glycol  17 g Oral Daily  . QUEtiapine  300 mg Oral q AM  . QUEtiapine  400 mg Oral QHS  . rivaroxaban  10 mg Oral Q supper  . senna-docusate  2 tablet Oral BID  . thiamine  100 mg Oral Daily   Continuous Infusions: None   Procedures/Studies: No results found.  2134, MD [facility] hospitalist  02/19/2020, 4:38 PM   LOS: 49 days

## 2020-02-20 LAB — COMPREHENSIVE METABOLIC PANEL
ALT: 29 U/L (ref 0–44)
AST: 19 U/L (ref 15–41)
Albumin: 3.8 g/dL (ref 3.5–5.0)
Alkaline Phosphatase: 55 U/L (ref 38–126)
Anion gap: 9 (ref 5–15)
BUN: 12 mg/dL (ref 6–20)
CO2: 27 mmol/L (ref 22–32)
Calcium: 9.2 mg/dL (ref 8.9–10.3)
Chloride: 101 mmol/L (ref 98–111)
Creatinine, Ser: 0.73 mg/dL (ref 0.61–1.24)
GFR, Estimated: >60 mL/min (ref >60)
Glucose, Bld: 112 mg/dL — ABNORMAL HIGH (ref 70–99)
Potassium: 3.9 mmol/L (ref 3.5–5.1)
Sodium: 137 mmol/L (ref 135–145)
Total Bilirubin: 0.4 mg/dL (ref 0.3–1.2)
Total Protein: 6.9 g/dL (ref 6.5–8.1)

## 2020-02-20 LAB — CBC WITH DIFFERENTIAL/PLATELET
Abs Immature Granulocytes: 0.01 10*3/uL (ref 0.00–0.07)
Basophils Absolute: 0 10*3/uL (ref 0.0–0.1)
Basophils Relative: 1 %
Eosinophils Absolute: 0.2 10*3/uL (ref 0.0–0.5)
Eosinophils Relative: 2 %
HCT: 33.9 % — ABNORMAL LOW (ref 39.0–52.0)
Hemoglobin: 10.8 g/dL — ABNORMAL LOW (ref 13.0–17.0)
Immature Granulocytes: 0 %
Lymphocytes Relative: 41 %
Lymphs Abs: 2.9 10*3/uL (ref 0.7–4.0)
MCH: 30.1 pg (ref 26.0–34.0)
MCHC: 31.9 g/dL (ref 30.0–36.0)
MCV: 94.4 fL (ref 80.0–100.0)
Monocytes Absolute: 0.6 10*3/uL (ref 0.1–1.0)
Monocytes Relative: 8 %
Neutro Abs: 3.4 10*3/uL (ref 1.7–7.7)
Neutrophils Relative %: 48 %
Platelets: 271 10*3/uL (ref 150–400)
RBC: 3.59 MIL/uL — ABNORMAL LOW (ref 4.22–5.81)
RDW: 14 % (ref 11.5–15.5)
WBC: 7 10*3/uL (ref 4.0–10.5)
nRBC: 0 % (ref 0.0–0.2)

## 2020-02-20 NOTE — Progress Notes (Signed)
Physical Therapy Treatment Patient Details Name: Isaac [name] MRN: [id number] DOB: 11/10/1980 Today's Date: 02/20/2020    History of Present Illness Isaac [name] is a 39 y.o. male with medical history significant of history of bacterial meningitis about 3 months ago, NSTEMI due to demand ischemia during his meningitis hospitalization (see EDP note for attached discharge summary) who is brought to the emergency department by family members due to the patient's worsening mental status.  The patient's mental status has been progressively worse.  He was seen at [facility] about 4 days ago and given Geodon due to restlessness, but subsequently discharged home.  His mother has been having a very difficult time taking care of him at home.  Neurology has been trying to assist in long-term placement, but the patient is uninsured and his mother is unable to afford it.  Neurology recently discontinue Keppra and started on Lamictal to control his agitation.  He has also been given Seroquel without significant results.    PT Comments    Patient agreeable and motivated for therapy - his fiancee present at bedside.  Patient demonstrates increased endurance/distance for ambulation requiring most assistance due to veering to the right, had to hold onto RW to keep straight/avoid bumping into objects on the right, followed with wheelchair for safety, but did not require sitting rest break to make it back to room, has most difficulty during stand to sitting due not getting close enough to chair requiring Mod assist to avoid sitting on armrests.  Patient had brief episode of agitation during standing to sit secondary to bumping right hip with c/o mild pain that resolved after 1-2 minutes and patient's behavior returned to being cooperative and appreciative.  Patient tolerated standing using RW for up to 9-10 minutes while having bowel movement and having nursing staff clean him.  Patient tolerated sitting up in chair  after therapy with his fiancee in room after therapy - nursing staff aware.  Patient will benefit from continued physical therapy in hospital and recommended venue below to increase strength, balance, endurance for safe ADLs and gait.   Follow Up Recommendations  SNF;Supervision for mobility/OOB;Supervision/Assistance - 24 hour     Equipment Recommendations  Rolling walker with 5" wheels    Recommendations for Other Services       Precautions / Restrictions Precautions Precautions: Fall Restrictions Weight Bearing Restrictions: No    Mobility  Bed Mobility Overal bed mobility: Needs Assistance Bed Mobility: Supine to Sit     Supine to sit: Min assist;Mod assist Sit to supine: Mod assist   General bed mobility comments: Required Min/mod assist to move BLE and for scooting to EOB  Transfers Overall transfer level: Needs assistance Equipment used: Rolling walker (2 wheeled) Transfers: Sit to/from [employer] Sit to Stand: Min assist;Mod assist Stand pivot transfers: Mod assist Squat pivot transfers: Min assist;Mod assist     General transfer comment: has most difficulty during stand to sitting due to poor carryover for getting closer to chair  Ambulation/Gait Ambulation/Gait assistance: Min assist;Mod assist Gait Distance (Feet): 120 Feet Assistive device: Rolling walker (2 wheeled) Gait Pattern/deviations: Decreased step length - right;Decreased step length - left;Decreased stride length;Drifts right/left;Trunk flexed Gait velocity: near normal   General Gait Details: Patient demonstrates increased enduranc/distance for gait training, requires less assistance for ambulation, able to assist patient by standing at side instead of leading in front, veering mostly to the right requiring helper to hold onto RW to keep straight, limited secondary to fatigue, followed with  wheelchair by his Doctor, general practice [facility] (Stroke Patients Only)       Balance Overall balance assessment: Needs assistance Sitting-balance support: Feet supported;No upper extremity supported Sitting balance-Leahy Scale: Fair Sitting balance - Comments: fair/good seated in chair   Standing balance support: During functional activity;Bilateral upper extremity supported Standing balance-Leahy Scale: Fair Standing balance comment: using RW                            Cognition Arousal/Alertness: Awake/alert Behavior During Therapy: WFL for tasks assessed/performed Overall Cognitive Status: History of cognitive impairments - at baseline                                 General Comments: Patient agreeable and motivated for therapy      Exercises General Exercises - Lower Extremity Ankle Circles/Pumps: Seated;AROM;Strengthening;Both;5 reps Long Arc Quad: Seated;AAROM;Strengthening;Both;10 reps Hip Flexion/Marching: Seated;AAROM;Strengthening;Both;10 reps    General Comments        Pertinent Vitals/Pain Pain Assessment: Faces Faces Pain Scale: Hurts a little bit Pain Location: bumped right hip during stand to sitting, but pain resolved after 1-2 minutes Pain Descriptors / Indicators: Discomfort;Grimacing Pain Intervention(s): Limited activity within patient's tolerance;Monitored during session;Repositioned    Home Living                      Prior Function            PT Goals (current goals can now be found in the care plan section) Acute Rehab PT Goals Patient Stated Goal: return home PT Goal Formulation: With patient Time For Goal Achievement: 03/03/20 Potential to Achieve Goals: Good Progress towards PT goals: Progressing toward goals    Frequency    Min 4X/week      PT Plan Current plan remains appropriate    Co-evaluation              AM-PAC PT "6 Clicks" Mobility   Outcome Measure  Help needed turning from your back to your side  while in a flat bed without using bedrails?: A Little Help needed moving from lying on your back to sitting on the side of a flat bed without using bedrails?: A Lot Help needed moving to and from a bed to a chair (including a wheelchair)?: A Lot Help needed standing up from a chair using your arms (e.g., wheelchair or bedside chair)?: A Lot Help needed to walk in hospital room?: A Lot Help needed climbing 3-5 steps with a railing? : A Lot 6 Click Score: 13    End of Session Equipment Utilized During Treatment: Gait belt Activity Tolerance: Patient tolerated treatment well;Patient limited by fatigue Patient left: in chair;with call bell/phone within reach;with chair alarm set;with family/visitor present Nurse Communication: Mobility status PT Visit Diagnosis: Unsteadiness on feet (R26.81);Other abnormalities of gait and mobility (R26.89);Muscle weakness (generalized) (M62.81)     Time: 0045-9977 PT Time Calculation (min) (ACUTE ONLY): 30 min  Charges:  $Gait Training: 8-22 mins $Therapeutic Activity: 8-22 mins                     2:14 PM, 02/20/20 Ocie Bob, MPT Physical Therapist with [facility] [phone] office [phone] mobile phone

## 2020-02-20 NOTE — Progress Notes (Signed)
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 Hepresented to the [facility] ED on 09/28/2019 with altered mental status and fever of 104 F, WBC 17.4 and lactate 4.7 after a day of generalized malaise, nausea, vomiting, weaknessand headache. He had elevated troponins over 5000.Due to combativeness, he required sedation and subsequent intubation [facility] and diagnosed with pneumococcal meningitis. He was started on ceftriaxone. Due to possible seizure, he was started on Keppra and placed on continuous EEG which was negative for seizure activity. 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 He received a 5 day course of SoluMedrol for cerebral edema. He then developed exteensor posturing with tachycardia and hypertension concerning for elevated intracranial pressure. He underwent Bolt ICP monitoring which demonstrated no elevated ICP. Follow up CT head and subsequent repeat MRI of brain showed small subacute hemorrhage in the right suboccipital lobe, either hemorrhagic conversion of small ischemic stroke vs extension of previously seen microhemorrhage, as well as hypoxic injury involving the bilateral caudate and putamen. He also sustained a NSTEMI. He was extubated on 10/10/2019 and underwent slow taper of Dilaudid to minimize withdrawal symptoms. A G-tube was inserted on 10/16/2019 and he was discharged on 10/18/2019 under the care of his mother who is a CNA.   He was brought to hospital with altered mental status and combative behavior..  Assessment/Plan: Acuteon chronicmetabolic encephalopathy--- worsening behavioral problems superimposed on underlying anoxic brain  injury -Abilify 5 mg twice dailydiscontinued 02/03/20 -Continue Lamictal 100 mg daily -Keppra was recently discontinued as patient's EEG was nonepileptiform -discussed the risks/benefits of seroquel including its "black box warning" with patient's mother -- accepts the risks with seroquel in hopes that his behavior can be controlled to allow for care of the patient. -continue to follow rec's by psychiatry/TTS service -Continue valium bid -will continue current adjusted seroquel dosages (300 mg am and 400 hs) -Consult reorientation and supportive care. 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   -patient will continue therapy by speech therapy.  SVT/HTN -patient had intermittent episodes of SVT -02/08/20-had episode of SVT -continue metoprolol to 37.5 mg bid -Heart rate stable since last metoprolol dose adjustment..  Social/Ethics -patient's mother was a trained CNA is no longer able to manage him at home due to escalating behaviors --Patient remains total care- He does not talk much. He is still incontinent. He requires assistance with essential ADLs such as bathing, dressing and using toilet -Familywilling to accept patient home after coordination over weekend--confirmed with mother  chronic anemia/iron deficiency anemia -multifactorial , partly nutritional, expect further drop in H&H with hydration/hemodilution -iron  sat--9% -folate 7.1 -B12--2310 -continue iron and folate. -will attempt to repeat cbc in am.   Disposition/Need for in-Hospital Stay- patient unable to be discharged at this time due to -behavioral problems requiring placement to appropriate facility --Atthis  time patient does not have a safe discharge plan, -Awaiting placement to appropriate facility as patient's mother who is a trained CNA was having significant difficulty managing in prior to admission. --Patient fianc trying to join forces with patient's mother to look after his care after discharge now that his behavior has improved.    Status is: Inpatient   Dispo: The patient is from:Home Anticipated d/c is OZ:[facility] Anticipated d/c date is: to be determined Patient currently is medically stable to d/c.  After discussing with TOC family will try to join forces to current obstruction at home now that his behavior has been better controlled.  Tinea observation and assistance with final decision by 02/19/2020.     Family Communication:noFamily at bedside today.  Consultants:psychiatry  Code Status: FULL   DVT Prophylaxis:xarelto   Procedures: As Listed in Progress Note Above  Antibiotics: None   Subjective:  Fair More coherent today in nad no focal deficit  Objective: Vitals:   02/19/20 1418 02/19/20 2037 02/20/20 0500 02/20/20 0622  BP: 112/73 (!) 115/59  116/72  Pulse: (!) 104 (!) 101  (!) 106  Resp: 18 19  18   Temp: 97.9 F (36.6 C) 98.2 F (36.8 C)  98.3 F (36.8 C)  TempSrc:      SpO2: 100% 100%  100%  Weight:   96.7 kg   Height:        Intake/Output Summary (Last 24 hours) at 02/20/2020 1502 Last data filed at 02/19/2020 1900 Gross per 24 hour  Intake 240 ml  Output 800 ml  Net -560 ml   Weight change:   Exam:  Alert coherent in nad  Much more awake alert coherent but cannot follow all commands ctab   abd soft nt dn no rebound no guard No le edema s1 s 2no m/r/g  Data Reviewed: I have personally reviewed following labs and imaging studies  Urine analysis:    Component Value Date/Time   COLORURINE YELLOW 01/01/2020 2133   APPEARANCEUR HAZY (A) 01/01/2020 2133   LABSPEC 1.003 (L) 01/01/2020 2133   PHURINE 6.0 01/01/2020 2133   GLUCOSEU NEGATIVE 01/01/2020 2133   HGBUR LARGE (A) 01/01/2020 2133   BILIRUBINUR NEGATIVE 01/01/2020 2133   KETONESUR 20 (A) 01/01/2020 2133   PROTEINUR 30 (A) 01/01/2020 2133   NITRITE NEGATIVE 01/01/2020 2133   LEUKOCYTESUR LARGE (A) 01/01/2020 2133   Scheduled Meds: . diazepam  5 mg Oral Q12H  . feeding supplement  237 mL Oral BID BM  . ferrous sulfate  325 mg Oral Q breakfast  . folic acid  1 mg Oral Daily  . lamoTRIgine  100 mg Oral Daily  . metoprolol tartrate  37.5 mg Oral BID  . OXcarbazepine  150 mg Oral BID  . polyethylene glycol  17 g Oral Daily  . QUEtiapine  300 mg Oral q AM  . QUEtiapine  400 mg Oral QHS  . rivaroxaban  10 mg Oral Q supper  . senna-docusate  2 tablet Oral BID  . thiamine  100 mg Oral Daily   Continuous Infusions: None   Procedures/Studies: No results found.  2134, MD [facility] hospitalist  02/20/2020, 3:02 PM   LOS: 50 days

## 2020-02-20 NOTE — TOC Progression Note (Signed)
Transition of Care [facility]) - Progression Note   Patient Details  Name: Isaac [name] MRN: [id number] Date of Birth: 1981-02-22  Transition of Care [facility] CM/SW Contact  Ewing Schlein, LCSW Phone Number: 02/20/2020, 3:23 PM  Clinical Narrative: CSW spoke with Christiane Ha with [facility] case management and was referred to the department that handles prior authorizations. Patient's [employer] ID #: [id number]. Future documentation will need to include the number of units (units=days) being requested for PCS. TOC to follow.  Barriers to Discharge: No SNF bed,Psych Bed not available  Expected Discharge Plan and Services Expected Discharge Date: 01/26/20                Readmission Risk Interventions No flowsheet data found.

## 2020-02-21 NOTE — Plan of Care (Signed)
  Problem: Education: Goal: Knowledge of General Education information will improve Description: Including pain rating scale, medication(s)/side effects and non-pharmacologic comfort measures Outcome: Not Progressing   Problem: Nutrition: Goal: Adequate nutrition will be maintained Outcome: Not Progressing   Problem: Coping: Goal: Level of anxiety will decrease Outcome: Not Progressing   

## 2020-02-21 NOTE — Progress Notes (Signed)
Patient had uneventful night and was cooperative with staff.

## 2020-02-21 NOTE — Progress Notes (Signed)
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 Hepresented to the [facility] ED on 09/28/2019 with altered mental status and fever of 104 F, WBC 17.4 and lactate 4.7 after a day of generalized malaise, nausea, vomiting, weaknessand headache. He had elevated troponins over 5000.Due to combativeness, he required sedation and subsequent intubation [facility] diagnosed with pneum[facility]. He was started on ceftriaxone. Due to possible seizure, he was started on Keppra and placed on continuous EEG which was negative for seizure activity. 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 He received a 5 day course of SoluMedrol for cerebral edema. He then developed exteensor posturing with tachycardia and hypertension concerning for elevated intracranial pressure. He underwent Bolt ICP monitoring which demonstrated no elevated ICP. Follow up CT head and subsequent repeat MRI of brain showed small subacute hemorrhage in the right suboccipital lobe, either hemorrhagic conversion of small ischemic stroke vs extension of previously seen microhemorrhage, as well as hypoxic injury involving the bilateral caudate and putamen. He also sustained a NSTEMI. He was extubated on 10/10/2019 and underwent slow taper of Dilaudid to minimize withdrawal symptoms. A G-tube was inserted on 10/16/2019 and he was discharged on 10/18/2019 under the care of his mother who is a CNA.   He was brought to hospital with altered mental status and combative behavior..  Assessment/Plan: Acuteon chronicmetabolic encephalopathy--- worsening behavioral problems superimposed on underlying anoxic brain  injury -Abilify 5 mg twice dailydiscontinued 02/03/20 -Continue Lamictal 100 mg daily -Keppra was recently discontinued as patient's EEG was nonepileptiform -discussed the risks/benefits of seroquel including its "black box warning" with patient's mother -- accepts the risks with seroquel in hopes that his behavior can be controlled to allow for care of the patient. -continue to follow rec's by psychiatry/TTS service -Continue valium bid -will continue current adjusted seroquel dosages (300 mg am and 400 hs) -Consult reorientation and supportive care. 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   -patient will continue therapy by speech therapy.  SVT/HTN -patient had intermittent episodes of SVT -02/08/20-had episode of SVT -continue metoprolol to 37.5 mg bid -Heart rate stable since last metoprolol dose adjustment..  Social/Ethics -patient's mother was a trained CNA is no longer able to manage him at home due to escalating behaviors --Patient remains total care- He does not talk much. He is still incontinent. He requires assistance with essential ADLs such as bathing, dressing and using toilet -Familywilling to accept patient home after coordination over weekend--confirmed with mother  chronic anemia/iron deficiency  anemia -multifactorial , partly nutritional, expect further drop in H&H with hydration/hemodilution -iron sat--9% -folate 7.1 -B12--2310 -continue iron and folate. -will attempt to repeat cbc in am.   Disposition/Need  for in-Hospital Stay- patient unable to be discharged at this time due to -behavioral problems requiring placement to appropriate facility --Atthis time patient does not have a safe discharge plan, -Awaiting placement to appropriate facility as patient's mother who is a trained CNA was having significant difficulty managing in prior to admission. --Patient fianc trying to join forces with patient's mother to look after his care after discharge now that his behavior has improved.    Status is: Inpatient   Dispo: The patient is from:Home Anticipated d/c is [facility] Anticipated d/c date is: to be determined Patient currently is medically stable to d/c.  After discussing with TOC family will try to join forces to current obstruction at home now that his behavior has been better controlled.  Tinea observation and assistance with final decision by 02/19/2020.     Family Communication:noFamily at bedside today.  Consultants:psychiatry  Code Status: FULL   DVT Prophylaxis:xarelto   Procedures: As Listed in Progress Note Above  Antibiotics: None   Subjective:  No distress Eating drinking No pain The patient does interactive but  but somewhat restricted output and hesitancy Objective: Vitals:   02/20/20 1500 02/20/20 2032 02/21/20 0500 02/21/20 0631  BP: 119/82 124/66  121/77  Pulse: (!) 106 (!) 105  74  Resp: 17 19  18   Temp: 98.5 F (36.9 C) 98.2 F (36.8 C)  98.3 F (36.8 C)  TempSrc: Oral     SpO2: 100% 100%  100%  Weight:   96.8 kg   Height:       No intake or output data in the 24 hours ending 02/21/20 1206 Weight change: 0.1 kg  Exam: EOMI NCAT no focal  deficit Sitting in bed Chest clear no added sound ROM intact upper extremities Needs passive assistance at times with lower but is able to move them independently Flat affect    Data Reviewed: I have personally reviewed following labs and imaging studies  Urine analysis:    Component Value Date/Time   COLORURINE YELLOW 01/01/2020 2133   APPEARANCEUR HAZY (A) 01/01/2020 2133   LABSPEC 1.003 (L) 01/01/2020 2133   PHURINE 6.0 01/01/2020 2133   GLUCOSEU NEGATIVE 01/01/2020 2133   HGBUR LARGE (A) 01/01/2020 2133   BILIRUBINUR NEGATIVE 01/01/2020 2133   KETONESUR 20 (A) 01/01/2020 2133   PROTEINUR 30 (A) 01/01/2020 2133   NITRITE NEGATIVE 01/01/2020 2133   LEUKOCYTESUR LARGE (A) 01/01/2020 2133   Scheduled Meds: . diazepam  5 mg Oral Q12H  . feeding supplement  237 mL Oral BID BM  . ferrous sulfate  325 mg Oral Q breakfast  . folic acid  1 mg Oral Daily  . lamoTRIgine  100 mg Oral Daily  . metoprolol tartrate  37.5 mg Oral BID  . OXcarbazepine  150 mg Oral BID  . polyethylene glycol  17 g Oral Daily  . QUEtiapine  300 mg Oral q AM  . QUEtiapine  400 mg Oral QHS  . rivaroxaban  10 mg Oral Q supper  . senna-docusate  2 tablet Oral BID  . thiamine  100 mg Oral Daily   Continuous Infusions: None   Procedures/Studies: No results found.  2134, MD [facility] hospitalist  02/21/2020, 12:06 PM   LOS: 51 days

## 2020-02-22 NOTE — Progress Notes (Signed)
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 Hepresented to the [facility] ED on 09/28/2019 with altered mental status and fever of 104 F, WBC 17.4 and lactate 4.7 after a day of generalized malaise, nausea, vomiting, weaknessand headache. He had elevated troponins over 5000.Due to combativeness, he required sedation and subsequent intubation [facility] and diagnosed with pneumococcal meningitis. He was started on ceftriaxone. Due to possible seizure, he was started on Keppra and placed on continuous EEG which was negative for seizure activity. 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 He received a 5 day course of SoluMedrol for cerebral edema. He then developed exteensor posturing with tachycardia and hypertension concerning for elevated intracranial pressure. He underwent Bolt ICP monitoring which demonstrated no elevated ICP. Follow up CT head and subsequent repeat MRI of brain showed small subacute hemorrhage in the right suboccipital lobe, either hemorrhagic conversion of small ischemic stroke vs extension of previously seen microhemorrhage, as well as hypoxic injury involving the bilateral caudate and putamen. He also sustained a NSTEMI. He was extubated on 10/10/2019 and underwent slow taper of Dilaudid to minimize withdrawal symptoms. A G-tube was inserted on 10/16/2019 and he was discharged on 10/18/2019 under the care of his mother who is a CNA.   He was brought to hospital with altered mental status and combative behavior..  Assessment/Plan: Acuteon chronicmetabolic encephalopathy--- admitted with worsening behavioral problems superimposed on underlying  anoxic brain injury --Overall Now much improved after medication adjustments -Abilify 5 mg twice dailydiscontinued 02/03/20 -Continue Lamictal 100 mg daily -Keppra was recently discontinued as patient's EEG was nonepileptiform -discussed the risks/benefits of seroquel including its "black box warning" with patient's mother -- accepts the risks with seroquel in hopes that his behavior can be controlled to allow for care of the patient. -Continue Seroquel 300 mg every morning and 400 mg nightly -Continue Trileptal 150 mg twice daily -continue to follow rec's by psychiatry/TTS service -Continue valium 5 mg bid -Okay to use as needed Haldol and as needed lorazepam for restlessness or agitation -Continue reorientation and supportive care. 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   -patient will continue therapy by speech therapy.  SVT/HTN -patient had intermittent episodes of SVT -02/08/20-had episode of SVT -continue metoprolol to 37.5 mg bid -Heart rate stable since last metoprolol dose adjustment..  Social/Ethics -patient's mother was a trained CNA was no longer able to manage him at home due to escalating behaviors--behaviors have improved with medication adjustments ----Patient  remains total care- He does not talk much. He is still incontinent. He requires assistance with essential ADLs such as bathing, dressing and using toilet -Familywilling to accept patient home  after coordination over weekend--confirmed with mother  chronic anemia/iron deficiency anemia -multifactorial , partly nutritional, and hydration/hemodilutionrelated -iron sat--9% -folate 7.1 -B12--2310 -continue iron and folate.  NB!! --Patient is on Xarelto 10 mg daily purely for DVT prophylaxis due to noncompliance with subcu injections of Lovenox and heparin   Disposition/Need for in-Hospital Stay- patient unable to be discharged at this time due to ---Patient remains medically stable, he has now been declined by [facility]  -Likely can discharge home with mother over the next day or 2 --patient's mother has been notified of pending discharge , home DME is being provided for family   Status is: Inpatient   Dispo: The patient is from:Home Anticipated d/c is TK:ZSWF with home health services Anticipated d/c date is: to be determined Patient currently is medically stable to d/c.  After discussing with TOC --Patient remains medically stable, he has now been declined by [facility]  -Likely can discharge home with mother over the next day or 2 --patient's mother has been notified of pending discharge , home DME is being provided for family    Family Communication:noFamily at bedside today.  Consultants:psychiatry  Code Status: FULL   DVT Prophylaxis:xarelto   Procedures: As Listed in Progress Note Above  Antibiotics: None   Subjective:  No distress Eating drinking No pain The patient does interactive but  but somewhat restricted output and hesitancy Objective: Vitals:   02/21/20 1451 02/21/20 2218 02/22/20 0636 02/22/20 1000  BP: 122/77 114/70 106/71 122/77  Pulse: 95 74 88 76  Resp: 18 18 18 18   Temp: (!) 97.4 F  (36.3 C) 98.3 F (36.8 C) 98.2 F (36.8 C)   TempSrc: Oral Oral Oral   SpO2: 100% 100% 99%   Weight:   95.9 kg   Height:        Intake/Output Summary (Last 24 hours) at 02/22/2020 1131 Last data filed at 02/22/2020 0700 Gross per 24 hour  Intake 480 ml  Output 1050 ml  Net -570 ml   Weight change: -0.9 kg  Exam: EOMI NCAT no focal deficit Sitting in bed Chest clear no added sound ROM intact upper extremities Needs passive assistance at times with lower but is able to move them independently Flat affect    Data Reviewed: I have personally reviewed following labs and imaging studies  Urine analysis:    Component Value Date/Time   COLORURINE YELLOW 01/01/2020 2133   APPEARANCEUR HAZY (A) 01/01/2020 2133   LABSPEC 1.003 (L) 01/01/2020 2133   PHURINE 6.0 01/01/2020 2133   GLUCOSEU NEGATIVE 01/01/2020 2133   HGBUR LARGE (A) 01/01/2020 2133   BILIRUBINUR NEGATIVE 01/01/2020 2133   KETONESUR 20 (A) 01/01/2020 2133   PROTEINUR 30 (A) 01/01/2020 2133   NITRITE NEGATIVE 01/01/2020 2133   LEUKOCYTESUR LARGE (A) 01/01/2020 2133   Scheduled Meds: . diazepam  5 mg Oral Q12H  . feeding supplement  237 mL Oral BID BM  . ferrous sulfate  325 mg Oral Q breakfast  . folic acid  1 mg Oral Daily  . lamoTRIgine  100 mg Oral Daily  . metoprolol tartrate  37.5 mg Oral BID  . OXcarbazepine  150 mg Oral BID  . polyethylene glycol  17 g Oral Daily  . QUEtiapine  300 mg Oral q AM  . QUEtiapine  400 mg Oral QHS  . rivaroxaban  10 mg Oral Q supper  . senna-docusate  2 tablet Oral BID  . thiamine  100 mg Oral Daily   Continuous Infusions: None  Procedures/Studies: No results found.  Shon Hale, MD [facility]  02/22/2020, 11:31 AM   LOS: 52 days

## 2020-02-23 MED ORDER — DIAZEPAM 5 MG PO TABS
5.0000 mg | ORAL_TABLET | Freq: Two times a day (BID) | ORAL | 0 refills | Status: DC
Start: 2020-02-23 — End: 2020-08-26

## 2020-02-23 MED ORDER — THIAMINE HCL 100 MG PO TABS
100.0000 mg | ORAL_TABLET | Freq: Every day | ORAL | 1 refills | Status: DC
Start: 2020-02-24 — End: 2020-08-26

## 2020-02-23 MED ORDER — METOPROLOL TARTRATE 37.5 MG PO TABS
37.5000 mg | ORAL_TABLET | Freq: Two times a day (BID) | ORAL | 2 refills | Status: DC
Start: 2020-02-23 — End: 2020-08-26

## 2020-02-23 MED ORDER — QUETIAPINE FUMARATE 300 MG PO TABS
300.0000 mg | ORAL_TABLET | Freq: Every morning | ORAL | 1 refills | Status: DC
Start: 2020-02-24 — End: 2020-08-26

## 2020-02-23 MED ORDER — QUETIAPINE FUMARATE 400 MG PO TABS
400.0000 mg | ORAL_TABLET | Freq: Every day | ORAL | 1 refills | Status: DC
Start: 2020-02-23 — End: 2020-08-26

## 2020-02-23 MED ORDER — FERROUS SULFATE 325 (65 FE) MG PO TABS
325.0000 mg | ORAL_TABLET | Freq: Every day | ORAL | 3 refills | Status: DC
Start: 2020-02-24 — End: 2020-08-26

## 2020-02-23 MED ORDER — SENNOSIDES-DOCUSATE SODIUM 8.6-50 MG PO TABS
2.0000 | ORAL_TABLET | Freq: Two times a day (BID) | ORAL | 1 refills | Status: DC
Start: 2020-02-23 — End: 2020-08-26

## 2020-02-23 MED ORDER — LAMOTRIGINE 100 MG PO TABS
100.0000 mg | ORAL_TABLET | Freq: Every day | ORAL | 1 refills | Status: DC
Start: 2020-02-24 — End: 2020-08-26

## 2020-02-23 MED ORDER — FOLIC ACID 1 MG PO TABS
1.0000 mg | ORAL_TABLET | Freq: Every day | ORAL | 1 refills | Status: DC
Start: 2020-02-24 — End: 2020-08-26

## 2020-02-23 MED ORDER — OXCARBAZEPINE 150 MG PO TABS
150.0000 mg | ORAL_TABLET | Freq: Two times a day (BID) | ORAL | 1 refills | Status: DC
Start: 2020-02-23 — End: 2020-08-26

## 2020-02-23 MED ORDER — ENSURE ENLIVE PO LIQD: 237.0000 mL | Freq: Two times a day (BID) | ORAL | Status: AC

## 2020-02-23 MED ORDER — POLYETHYLENE GLYCOL 3350 17 G PO PACK: 17.0000 g | PACK | Freq: Every day | ORAL | 1 refills | Status: AC

## 2020-02-23 NOTE — Discharge Summary (Signed)
Physician Discharge Summary  Isaac [name] GQB:[id number] DOB: 09-03-1980 DOA: 01/01/2020  PCP: Suzan Slick, MD  Admit date: 01/01/2020 Discharge date: 02/23/2020  Time spent: 35 minutes  Recommendations for Outpatient Follow-up:  1. Repeat basic metabolic panel to evaluate lites renal function 2. Repeat CBC to follow hemoglobin trend and stability.   Discharge Diagnoses:  Principal Problem:   Combative behavior Active Problems:   Acute urinary tract infection   History of bacterial meningitis   Gastrostomy tube in place (HCC)   Abnormal ECG   Normocytic anemia   Agitation   Cognitive deficit due to recent stroke   Acute cystitis with hematuria   SVT (supraventricular tachycardia) (HCC)   Cocaine use disorder (HCC)   Discharge Condition: Stable and improved.  Discharged home with home health care and family support.  DME's have been provided instruction to follow-up with PCP in about 2 weeks instructed.  CODE STATUS: Full code.  Diet recommendation: Regular diet with thin liquids.  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 [facility] ED on 09/28/2019 with altered mental status and fever of 104 F, WBC 17.4 and lactate 4.7 after a day of generalized malaise, nausea, vomiting, weaknessand headache. He had elevated troponins over 5000.Due to combativeness, he required sedation and subsequent intubation and [facility] and diagnosed with pneumococcal meningitis. He was started on ceftriaxone. Due to possible seizure, he was started on Keppra and placed on continuous EEG which was negative for seizure activity. 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 He received a 5 day course of SoluMedrol for cerebral edema. He then developed exteensor posturing with tachycardia and hypertension concerning for elevated intracranial pressure. He underwent Bolt ICP monitoring which demonstrated no elevated ICP. Follow up CT head and subsequent repeat MRI of brain showed small subacute hemorrhage in the right suboccipital lobe, either hemorrhagic conversion of small ischemic stroke vs extension of previously seen microhemorrhage, as well as hypoxic injury involving the bilateral caudate and putamen. He also sustained a NSTEMI. He was extubated on 10/10/2019 and underwent slow taper of Dilaudid to minimize withdrawal symptoms. A G-tube was inserted on 10/16/2019 and he was discharged on 10/18/2019 under the care of his mother who is a CNA.   He was brought to hospital with altered mental status and combative behavior.Marland Kitchen  Hospital Course:  Acuteon chronicmetabolic encephalopathy--- worsening behavioral problems superimposed on underlying anoxic brain injury -Abilify 5 mg twice dailydiscontinued 02/03/20 -Continue Lamictal and oxcarbazepine.  -Keppra was recently discontinued as patient's EEG was nonepileptiformand may have been contributing to increased agitation. -Previously discussed the risks/benefits of seroquel including its "black box warning" with patient's mother  -she expresses understanding in light of the patient's behavioral issues -She expresses that other medications and change of venue/scenery have been tried to try to provide care for the patient; without success.  -continue to follow rec's by psychiatry/TTS service. -Continue valium bid -will continue current adjusted seroquel dosages (300 mg am and 400 hs) -Constant reorientation and supportive care.  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 -Home health services arranged at discharge.  Dysphagia/FEN -Seen by speech therapyeval appreciated, continue regular diet -  continues to eat well--eatingclose to 100% meals -PEG tube removed 01/19/2020 -Continue ongoing speech therapy evaluation and management.   -eating well since PEG removed. -patient will continue therapy by speech therapy.  SVT/HTN -patient had intermittent episodes of SVT -02/08/20-had episode of SVT -continue metoprolol to 37.5 mg bid -Heart rate stable since last metoprolol dose adjustment..  History of UTI at time of admission. -Patient's condition properly treated with antibiotics -At discharge no signs of acute infection and no complaints of dysuria or frequency. -Advised to maintain adequate hydration  chronic anemia/iron deficiency anemia -multifactorial , partly nutritional, expect further drop in H&H with hydration/hemodilution -iron sat--9% -folate 7.1 -B12--2310 -continue iron and folate supplementation.. -Repeat CBC to follow hemoglobin trend at follow-up visit.  Disposition/physical deconditioning --Patient fianc will join forces with patient's mother to look after his care after discharge now that his behavior has improved. -Outpatient follow-up with PCP -Home health services on 8 will be arranged by TOC to further assist with patient's care and discharge plans.  Procedures: See below for x-ray reports  Consultations:  Psychiatry service  Discharge Exam: Vitals:   02/22/20 2050 02/23/20 0643  BP: 117/65 116/68  Pulse: 83 88  Resp: 18 18  Temp: 98.5 F (36.9 C) 97.9 F (36.6 C)  SpO2: 100% 100%    General exam: Alert, afebrile, no chest pain, no nausea, no vomiting.  Patient has continue with good behavior and no overnight events reported. Respiratory system: Clear to auscultation. Respiratory effort normal.  No using accessory muscles. Cardiovascular system:RRR. No murmurs, rubs, gallops.  No  JVD Gastrointestinal system: Abdomen is nondistended, soft and nontender. No organomegaly or masses felt. Normal bowel sounds heard. Central nervous system: No new focal neurological deficits. Extremities: No cyanosis or clubbing. Skin: No rashes, no petechiae Psychiatry: Mood & affect appropriate.   Discharge Instructions   Discharge Instructions    Diet - low sodium heart healthy   Complete by: As directed    Discharge instructions   Complete by: As directed    The medications are prescribed Maintain adequate hydration Arrange follow-up with PCP in 2 weeks.   No wound care   Complete by: As directed      Allergies as of 02/23/2020   No Known Allergies     Medication List    STOP taking these medications   levETIRAcetam 100 MG/ML solution Commonly known as: KEPPRA     TAKE these medications   diazepam 5 MG tablet Commonly known as: VALIUM Take 1 tablet (5 mg total) by mouth every 12 (twelve) hours.   feeding supplement Liqd Take 237 mLs by mouth 2 (two) times daily between meals.   ferrous sulfate 325 (65 FE) MG tablet Take 1 tablet (325 mg total) by mouth daily with breakfast. Start taking on: February 24, 2020   folic acid 1 MG tablet Commonly known as: FOLVITE Take 1 tablet (1 mg total) by mouth daily. Start taking on: February 24, 2020   lamoTRIgine 100 MG tablet Commonly known as: LAMICTAL Take 1 tablet (100 mg total) by mouth daily. Start taking on: February 24, 2020 What changed:   medication strength  how much to take  how to take this  when to take this  additional instructions   Metoprolol Tartrate 37.5 MG Tabs Take 37.5 mg by mouth 2 (two) times daily.   OXcarbazepine 150 MG tablet Commonly known as: TRILEPTAL Take 1 tablet (150 mg total) by mouth 2 (two) times daily.   polyethylene glycol 17 g packet  Commonly known as: MIRALAX / GLYCOLAX Take 17 g by mouth daily. Hold for diarrhea Start taking on: February 24, 2020    QUEtiapine 400 MG tablet Commonly known as: SEROQUEL Take 1 tablet (400 mg total) by mouth at bedtime. What changed:   medication strength  how much to take  how to take this  when to take this  additional instructions   QUEtiapine 300 MG tablet Commonly known as: SEROQUEL Take 1 tablet (300 mg total) by mouth in the morning. Start taking on: February 24, 2020 What changed: You were already taking a medication with the same name, and this prescription was added. Make sure you understand how and when to take each.   senna-docusate 8.6-50 MG tablet Commonly known as: Senokot-S Take 2 tablets by mouth 2 (two) times daily. Hold for diarrhea   thiamine 100 MG tablet Take 1 tablet (100 mg total) by mouth daily. Start taking on: February 24, 2020            Durable Medical Equipment  (From admission, onward)         Start     Ordered   02/23/20 [phone]  For home use only DME Walker rolling  Once       Question Answer Comment  Walker: With 5 Inch Wheels   Patient needs a walker to treat with the following condition Physical deconditioning      02/23/20 0842   02/23/20 0843  For home use only DME 3 n 1  Once        02/23/20 0842         No Known Allergies  Follow-up Information    Suzan Slick, MD. Go on 03/03/2020.   Specialty: Family Medicine Why: 2:00 appointment Contact information: [address] [place] [address] [phone]               The results of significant diagnostics from this hospitalization (including imaging, microbiology, ancillary and laboratory) are listed below for reference.    Microbiology: No results found for this or any previous visit (from the past 240 hour(s)).   Labs: Basic Metabolic Panel: Recent Labs  Lab 02/17/20 0704 02/20/20 0944  NA 136 137  K 4.2 3.9  CL 98 101  CO2 28 27  GLUCOSE 110* 112*  BUN 14 12  CREATININE 0.72 0.73  CALCIUM 9.4 9.2   Liver Function Tests: Recent Labs  Lab  02/20/20 0944  AST 19  ALT 29  ALKPHOS 55  BILITOT 0.4  PROT 6.9  ALBUMIN 3.8   CBC: Recent Labs  Lab 02/17/20 0704 02/20/20 0944  WBC 10.1 7.0  NEUTROABS  --  3.4  HGB 11.3* 10.8*  HCT 36.6* 33.9*  MCV 95.6 94.4  PLT 299 271    Signed:  Vassie Loll MD.  [facility] 02/23/2020, 1:19 PM

## 2020-02-23 NOTE — TOC Transition Note (Signed)
Transition of Care [facility]) - CM/SW Discharge Note   Patient Details  Name: Isaac [name] MRN: [id number] Date of Birth: [birth year]  [facility] CM/SW Contact:  Karn Cassis, LCSW Phone Number: 02/23/2020, 12:14 PM   Clinical Narrative: LCSW spoke with pt's mother, Chyrl Civatte who states that she is has everything ready for pt to d/c home today. She requests rolling walker and 3N1. [facility] to deliver to hospital prior to d/c. Hospital follow up appointment made with PCP and information is on AVS. LCSW spoke with Elpidio Eric, case manager [phone]) for [facility] services. Herbert Seta states she is scheduling appointment with pt's mother for assessment to determine how many hours pt qualifies for. No other needs reported. Pt's mother will provide transport home.       Final next level of care: Home/Self Care Barriers to Discharge: Barriers Resolved   Patient Goals and CMS Choice        Discharge Placement                  Name of family member notified: Chyrl Civatte- mother Patient and family notified of of transfer: 02/23/20  Discharge Plan and Services                DME Arranged: Dan Humphreys rolling,3-N-1 DME Agency: Earlean Shawl Date DME Agency Contacted: 02/23/20 Time DME Agency Contacted: [phone]              Social Determinants of Health (SDOH) Interventions     Readmission Risk Interventions Readmission Risk Prevention Plan 02/23/2020  PCP or Specialist Appt within 3-5 Days Complete

## 2020-02-23 NOTE — Plan of Care (Signed)

## 2020-02-23 NOTE — Progress Notes (Signed)
Physical Therapy Treatment Patient Details Name: Isaac [name] MRN: [id number] DOB: 1980/12/08 Today's Date: 02/23/2020    History of Present Illness Isaac [name] is a 39 y.o. male with medical history significant of history of bacterial meningitis about 3 months ago, NSTEMI due to demand ischemia during his meningitis hospitalization (see EDP note for attached discharge summary) who is brought to the emergency department by family members due to the patient's worsening mental status.  The patient's mental status has been progressively worse.  He was seen at [facility] about 4 days ago and given Geodon due to restlessness, but subsequently discharged home.  His mother has been having a very difficult time taking care of him at home.  Neurology has been trying to assist in long-term placement, but the patient is uninsured and his mother is unable to afford it.  Neurology recently discontinue Keppra and started on Lamictal to control his agitation.  He has also been given Seroquel without significant results.    PT Comments    Patient demonstrates slow labored movement for sitting up at bedside, fair/good return for reaching for RW during sit to stands and reaching for armrest of chair during stand to sitting with Min verbal/tactile cueing, veers to the right during gait training requiring Mod assist to prevent bumping into nearby objects/walls and had to take a sitting rest break in wheelchair before ambulating back to room.  Patient tolerated sitting up in chair after therapy with his mother present in room.  Patient will benefit from continued physical therapy in hospital and recommended venue below to increase strength, balance, endurance for safe ADLs and gait.    Follow Up Recommendations  SNF;Supervision for mobility/OOB;Supervision/Assistance - 24 hour     Equipment Recommendations  Rolling walker with 5" wheels    Recommendations for Other Services       Precautions / Restrictions  Precautions Precautions: Fall Restrictions Weight Bearing Restrictions: No    Mobility  Bed Mobility Overal bed mobility: Needs Assistance Bed Mobility: Supine to Sit     Supine to sit: Min assist;Mod assist     General bed mobility comments: increased time, slow labored movement  Transfers Overall transfer level: Needs assistance Equipment used: Rolling walker (2 wheeled) Transfers: Sit to/from [employer] Sit to Stand: Min assist;Mod assist Stand pivot transfers: Min assist;Mod assist       General transfer comment: fair/good return for reaching for RW during sit to stands and reaching for armrest during stand to sitting  Ambulation/Gait Ambulation/Gait assistance: Min assist;Mod assist Gait Distance (Feet): 75 Feet Assistive device: Rolling walker (2 wheeled) Gait Pattern/deviations: Decreased step length - right;Decreased step length - left;Decreased stride length;Drifts right/left Gait velocity: near normal   General Gait Details: labored cadence with frequent veering to the right requring tactile cueing by pulling RW to the left to avoid bumping into near by objects, had to take sitting rest break before returning to room   Stairs             Wheelchair Mobility    Modified Rankin (Stroke Patients Only)       Balance Overall balance assessment: Needs assistance Sitting-balance support: Feet supported;No upper extremity supported Sitting balance-Leahy Scale: Fair Sitting balance - Comments: fair/good seated at EOB   Standing balance support: During functional activity;Bilateral upper extremity supported Standing balance-Leahy Scale: Fair Standing balance comment: using RW  Cognition Arousal/Alertness: Awake/alert Behavior During Therapy: WFL for tasks assessed/performed Overall Cognitive Status: Within Functional Limits for tasks assessed                                 General  Comments: Patient agreeable and motivated for therapy      Exercises General Exercises - Lower Extremity Long Arc Quad: Seated;AAROM;Strengthening;Both;10 reps Hip Flexion/Marching: Seated;AAROM;Strengthening;Both;10 reps    General Comments        Pertinent Vitals/Pain Pain Assessment: No/denies pain    Home Living                      Prior Function            PT Goals (current goals can now be found in the care plan section) Acute Rehab PT Goals Patient Stated Goal: return home PT Goal Formulation: With patient Time For Goal Achievement: 03/03/20 Potential to Achieve Goals: Good Progress towards PT goals: Progressing toward goals    Frequency    Min 4X/week      PT Plan Current plan remains appropriate    Co-evaluation              AM-PAC PT "6 Clicks" Mobility   Outcome Measure  Help needed turning from your back to your side while in a flat bed without using bedrails?: A Little Help needed moving from lying on your back to sitting on the side of a flat bed without using bedrails?: A Lot Help needed moving to and from a bed to a chair (including a wheelchair)?: A Lot Help needed standing up from a chair using your arms (e.g., wheelchair or bedside chair)?: A Lot Help needed to walk in hospital room?: A Lot Help needed climbing 3-5 steps with a railing? : A Lot 6 Click Score: 13    End of Session Equipment Utilized During Treatment: Gait belt Activity Tolerance: Patient tolerated treatment well;Patient limited by fatigue Patient left: in chair;with call bell/phone within reach;with chair alarm set;with family/visitor present Nurse Communication: Mobility status PT Visit Diagnosis: Unsteadiness on feet (R26.81);Other abnormalities of gait and mobility (R26.89);Muscle weakness (generalized) (M62.81)     Time: 1132-1203 PT Time Calculation (min) (ACUTE ONLY): 31 min  Charges:  $Gait Training: 8-22 mins $Therapeutic Activity: 8-22  mins                     2:25 PM, 02/23/20 Ocie Bob, MPT Physical Therapist with [facility] [phone] office [phone] mobile phone

## 2020-03-03 DIAGNOSIS — I69319 Unspecified symptoms and signs involving cognitive functions following cerebral infarction: Secondary | ICD-10-CM

## 2020-04-20 DIAGNOSIS — R4701 Aphasia: Secondary | ICD-10-CM | POA: Insufficient documentation

## 2020-05-10 NOTE — Progress Notes (Signed)
NEUROLOGY FOLLOW UP OFFICE NOTE  Isaac [name] [id number]  Assessment/Plan:   1.  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  Lamotrigine 100mg  daily 2.  Oxcarbazepine 150mg  twice daily 3.  Seroquel 300mg  in AM and 400mg  at bedtime.  Discussed black box warning with fiancee.  Request repeat EKG to evaluate QT interval.  They would like it performed in [place] at his PCP's office. 4.  Diazepam 5mg  every 12 hours 5.  Check CBC and CMP for medication management 6.  PT/OT/speech therapy 7.  Follow up 6 months.  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  UPDATE: Current medications:  Lamotrigine 100mg  daily, oxcarbazepine 150mg  BID, Seroquel 300mg /400mg , Valium 5mg  Q12h, metoprolol 37.5mg  BID, thiamine 100mg  daily  EEG from 12/10/2019 showed generalized slowing but no epileptiform discharges.  We decided to continue AED but transitioned from Keppra to Lamictal for mood stabilization.  He was also started on Seroquel (black box warning explained with mother).  However, he continued to become increasingly agitated and had to be hospitalized on 01/01/2020 for agitation.  CT head personally reviewed showed generalized atrophy but no acute abnormalities.  He was evaluated by psychiatry.  Oxcarbazepine was started in addition to Lamictal and was prescribed Valium.  PEG tube was removed.  They were unable to find placement so he went home with his fiancee.  Overall, he has been doing well.  He is less agitated and more manageable.  Once in awhile he may have an outburst.  He is approved to get an aid to help him 8 hours 6 days a week.  He requires assistance with all ADLs.  He is incontinent.  He is more aware of his surroundings.  He is able to follow some commands such  as brushing his teeth.  He is still participating in PT/OT/speech therapy.  [place]    HISTORY: He presented to the [facility] ED on 09/28/2019 with altered mental status and fever of 104 F, WBC 17.4 and lactate 4.7 after a day of generalized malaise, nausea, vomiting, weakness and headache.  He had elevated troponins over 5000.  Due to combativeness, he required sedation and subsequent intubation and transferred to [facility] where he underwent LP and diagnosed with pneumococcal meningitis.  He was started on ceftriaxone.  Due to possible seizure, he was started on Keppra and placed on continuous EEG which was negative for seizure activity.  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  He received a 5 day course of SoluMedrol for cerebral edema.  He then developed exteensor posturing with tachycardia and hypertension concerning for elevated intracranial pressure.  He underwent Bolt ICP monitoring which demonstrated no elevated ICP.  Follow up CT head and subsequent repeat MRI of brain showed small subacute hemorrhage in the right suboccipital lobe, either hemorrhagic conversion of small ischemic stroke vs extension of previously seen microhemorrhage, as well as hypoxic injury involving the bilateral caudate and putamen.  He also sustained a NSTEMI.  He was extubated on 10/10/2019 and underwent slow taper of Dilaudid to minimize withdrawal symptoms.  A G-tube was inserted on 10/16/2019 and he was discharged on 10/18/2019 under the care of his mother who is a CNA.    He  has been going to outpatient PT/OT and has been getting stronger.  He is still taking food and medication via G-tube.  He has not been able to establish care with a PCP yet.  He is easily angry and has kicked his mother.  He does not talk much and often when he speaks, it may be a curse word.  He is still incontinent.  He requires assistance with  essential ADLs such as bathing, dressing and using toilet.  He sleeps well.    Testing: 07/25-26/2021 SERUM LABS:  Lactic acid 4.7, ammonia 20, ethanol negative, troponins 5105, CK 682, CK-MB 5.92, HIV nonreactive, Syphilis screen negative, COVID-19 PCR negative, Cryptococcal antigen negative, C3 and C4 102 and 29.1,  09/28/2019 Urine:  UDS positive for cannabinoids and benzodiazepines 09/28/2019 CT HEAD/CERVICAL SPINE:  1. No acute intracranial abnormalities. 2. No evidence for cervical spine fracture or dislocation.  09/28/2019 ECHOCARDIOGRAM:  1. The left ventricular systolic function is borderline, LVEF is visually estimated at 50%.  2. Definity contrast would clarify LV function.  3. Right ventricle is not well visualized but probably normal.  4. No evidence of an interatrial communication or intrapulmonary shunt.  5. No significant valvular abnormalities.  6. Technically difficult study due to body habitus.  09/29/2019 CSF:  Cell count 3560, 93% neutrophil, protein 187, glucose 13, gram stain  And culture negative, acid fast stain negative, VDRL nonreactive, cryptococcal ag negative, fungal culture negative, VZV PCR negative, ACE negative, HSV 1 & 2 PCR negative, cytology with severe acute inflammation but negative for malignant cells. 09/29/2019 CT H EAD WO:  New multifocal cerebral edema with progressive mild effacement of the ventricles. Recommend correlation with forthcoming MRI.  09/29/2019 MRI BRAIN W WO:  1.Widespread acute microhemorrhagic leukoencephalopathy. Overall pattern is unusual, but given the perivascular distribution, is likely the result of infectious vasculitis.  2.Superimposed findings of meningitis, including leptomeningeal signal abnormality and enhancement.  09/29/2019 CTA/CTV HEAD W WO:  1. Unremarkable CTA of the head. Specifically, no CT angiogram evidence of small or medium vessel vasculitis.  2. No venous thromboses.  3. Extensive supratentorial white matter  hypodensities are noted and correspond to the extensive parenchymal abnormalities seen on recent brain MRI. 09/30/2019 VIDEO EEG:  Severe diffuse cerebral dysfunction, initially slightly worse over the left hemisphere, which is nonspecific for etiology but could be due in part to sedative effect in addition to known bilateral ischemic lesions   As the recording progressed, there is slight improvement in  background activity.  No seizures or epileptiform discharges.  There were occasional extensor posturing movements of both arms associated with up gaze lasting up to several minutes and no associated EEG change other than evidence of reactivity. Most of these episodes occurred in response to stimulation  10/11/2019 CT HEAD WO:  1.26 x 18 x 30 mm acute hemorrhage in the right parietal lobe the hemorrhage extends to the ependyma of the posterior right lateral ventricle. No intraventricular hemorrhage or subarachnoid hemorrhage is seen.  2.Tiny acute hemorrhage in the right frontal cortex and the tiny acute hemorrhage in the cortex of the posterior superior left temporal lobe. 10/12/2019 MRI BRAIN W WO:  1. Acute parenchymal hemorrhages right parietal lobe and superficial right frontal lobe similar to recent CT scan. Increase in number of superficial supratentorial microhemorrhages.  2. Development of restricted diffusion and T2/FLAIR hyperintensity bilateral basal ganglia indicating acute to early subacute insult such as hypoxic injury or toxic/metabolic insult. Appearance is not suggestive of parenchymal changes of infection.  3. Maturation of the extensive restricted diffusion throughout the supratentorial white matter now seen as regions of enhancement and T2/FLAIR hyperintensity.  4. Leptomeningeal enhancement posteriorly at the vertex consistent with known meningitis; no abscess or space-occupying infected fluid collection.  10/15/2019 CT HEAD WO:  1. Evolving right parietal lobe parenchymal hematoma. The  previously seen right frontal lobe hemorrhage is no longer definitively visualized. No new parenchymal hemorrhage.  2. Subtle areas of hypodensity within periventricular and subcortical white matter, corresponding to some of the areas of restricted diffusion and abnormal enhancement seen on prior MRI. However, these areas of white matter abnormality are much less conspicuous on CT compared to prior MR imaging.  3. No CT evidence of acute cortically based infarct.  10/15/2019 VIDEO EEG:  This EEG is abnormal secondary to moderate diffuse slowing.   PAST MEDICAL HISTORY: Past Medical History:  Diagnosis Date  . History of bacterial meningitis 01/01/2020  . NSTEMI (non-ST elevated myocardial infarction) (HCC) 09/28/2019    MEDICATIONS: Current Outpatient Medications on File Prior to Visit  Medication Sig Dispense Refill  . diazepam (VALIUM) 5 MG tablet Take 1 tablet (5 mg total) by mouth every 12 (twelve) hours. 60 tablet 0  . feeding supplement (ENSURE ENLIVE / ENSURE PLUS) LIQD Take 237 mLs by mouth 2 (two) times daily between meals.    . ferrous sulfate 325 (65 FE) MG tablet Take 1 tablet (325 mg total) by mouth daily with breakfast. 30 tablet 3  . folic acid (FOLVITE) 1 MG tablet Take 1 tablet (1 mg total) by mouth daily. 30 tablet 1  . lamoTRIgine (LAMICTAL) 100 MG tablet Take 1 tablet (100 mg total) by mouth daily. 30 tablet 1  . metoprolol tartrate 37.5 MG TABS Take 37.5 mg by mouth 2 (two) times daily. 60 tablet 2  . OXcarbazepine (TRILEPTAL) 150 MG tablet Take 1 tablet (150 mg total) by mouth 2 (two) times daily. 60 tablet 1  . polyethylene glycol (MIRALAX / GLYCOLAX) 17 g packet Take 17 g by mouth daily. Hold for diarrhea 30 each 1  . QUEtiapine (SEROQUEL) 300 MG tablet Take 1 tablet (300 mg total) by mouth in the morning. 30 tablet 1  . QUEtiapine (SEROQUEL) 400 MG tablet Take 1 tablet (400 mg total) by mouth at bedtime. 30 tablet 1  . senna-docusate (SENOKOT-S) 8.6-50 MG tablet Take 2  tablets by mouth 2 (two) times daily. Hold for diarrhea 60 tablet 1  . thiamine 100 MG tablet Take 1 tablet (100 mg total) by mouth daily. 30 tablet 1   No current facility-administered medications on file prior to visit.    ALLERGIES: No Known Allergies  FAMILY HISTORY: No family history on file.    Objective:  Blood pressure (!) 164/97, pulse 78, resp. rate 18, height 6\' 2"  (1.88 m), weight 237 lb (107.5 kg), SpO2 100 %. General: No acute distress.  Patient appears well-groomed.   Head:  Normocephalic/atraumatic Eyes:  Fundi examined but not visualized Neck: supple, no paraspinal tenderness, full range of motion Heart:  Regular rate and rhythm Lungs:  Clear to auscultation bilaterally Back: No paraspinal tenderness Neurological Exam: alert and oriented to at least self.  He has mixed expressive and receptive aphasia. Unable to assess visual fields but overall CN II-XII intact. Bulk and tone normal, muscle strength 5/5 throughout.  Deep tendon reflexes 3+ right upper extremity, otherwise throughout.  Unable to assess finger to nose.  Stands from wheelchair with assistance.  Difficulty ambulating.      Everlena Cooper, DO  CC:  Sundra Aland, MD

## 2020-05-11 ENCOUNTER — Encounter: Payer: Self-pay | Admitting: Neurology

## 2020-05-11 ENCOUNTER — Other Ambulatory Visit: Payer: Self-pay

## 2020-05-11 ENCOUNTER — Ambulatory Visit (INDEPENDENT_AMBULATORY_CARE_PROVIDER_SITE_OTHER): Payer: Medicaid Other | Admitting: Neurology

## 2020-05-11 ENCOUNTER — Other Ambulatory Visit (INDEPENDENT_AMBULATORY_CARE_PROVIDER_SITE_OTHER): Payer: Medicaid Other

## 2020-05-11 VITALS — BP 164/97 | HR 78 | Resp 18 | Ht 74.0 in | Wt 237.0 lb

## 2020-05-11 DIAGNOSIS — F09 Unspecified mental disorder due to known physiological condition: Secondary | ICD-10-CM

## 2020-05-11 DIAGNOSIS — G042 Bacterial meningoencephalitis and meningomyelitis, not elsewhere classified: Secondary | ICD-10-CM

## 2020-05-11 DIAGNOSIS — R4701 Aphasia: Secondary | ICD-10-CM

## 2020-05-11 LAB — COMPREHENSIVE METABOLIC PANEL
ALT: 42 U/L (ref 0–53)
AST: 21 U/L (ref 0–37)
Albumin: 4.5 g/dL (ref 3.5–5.2)
Alkaline Phosphatase: 83 U/L (ref 39–117)
BUN: 12 mg/dL (ref 6–23)
CO2: 33 mEq/L — ABNORMAL HIGH (ref 19–32)
Calcium: 9.6 mg/dL (ref 8.4–10.5)
Chloride: 100 mEq/L (ref 96–112)
Creatinine, Ser: 0.7 mg/dL (ref 0.40–1.50)
GFR: 117.57 mL/min (ref >60.00)
Glucose, Bld: 102 mg/dL — ABNORMAL HIGH (ref 70–99)
Potassium: 3.9 mEq/L (ref 3.5–5.1)
Sodium: 140 mEq/L (ref 135–145)
Total Bilirubin: 0.3 mg/dL (ref 0.2–1.2)
Total Protein: 7.6 g/dL (ref 6.0–8.3)

## 2020-05-11 LAB — CBC
HCT: 38.7 % — ABNORMAL LOW (ref 39.0–52.0)
Hemoglobin: 12.8 g/dL — ABNORMAL LOW (ref 13.0–17.0)
MCHC: 33.2 g/dL (ref 30.0–36.0)
MCV: 89.3 fl (ref 78.0–100.0)
Platelets: 315 10*3/uL (ref 150.0–400.0)
RBC: 4.33 Mil/uL (ref 4.22–5.81)
RDW: 14.2 % (ref 11.5–15.5)
WBC: 7.4 10*3/uL (ref 4.0–10.5)

## 2020-05-11 NOTE — Patient Instructions (Signed)
1.  Lamotrigine 100mg  daily 2.  Oxcarbazepine 150mg  twice daily 3.  Seroquel 300mg  in AM and 400mg  at bedtime 4.  Diazepam 5mg  every 12 hours 5.  Check CBC and CMP today 6.  Have another EKG performed at Dr. office (because he is taking Seroquel) 7.  Follow up 6 months.

## 2020-06-09 ENCOUNTER — Encounter (HOSPITAL_COMMUNITY): Payer: Self-pay

## 2020-06-09 ENCOUNTER — Other Ambulatory Visit: Payer: Self-pay

## 2020-06-09 ENCOUNTER — Emergency Department (HOSPITAL_COMMUNITY)
Admission: EM | Admit: 2020-06-09 | Discharge: 2020-08-27 | Disposition: A | Discharge status: Home / Self Care | Payer: Medicaid Other | Attending: Emergency Medicine | Admitting: Emergency Medicine

## 2020-06-09 ENCOUNTER — Emergency Department (HOSPITAL_COMMUNITY): Payer: Medicaid Other

## 2020-06-09 DIAGNOSIS — N3000 Acute cystitis without hematuria: Secondary | ICD-10-CM | POA: Diagnosis not present

## 2020-06-09 DIAGNOSIS — R000 Tachycardia, unspecified: Secondary | ICD-10-CM | POA: Insufficient documentation

## 2020-06-09 DIAGNOSIS — R451 Restlessness and agitation: Secondary | ICD-10-CM | POA: Insufficient documentation

## 2020-06-09 DIAGNOSIS — I248 Other forms of acute ischemic heart disease: Secondary | ICD-10-CM | POA: Diagnosis not present

## 2020-06-09 DIAGNOSIS — R61 Generalized hyperhidrosis: Secondary | ICD-10-CM | POA: Insufficient documentation

## 2020-06-09 DIAGNOSIS — R509 Fever, unspecified: Secondary | ICD-10-CM | POA: Diagnosis present

## 2020-06-09 DIAGNOSIS — Z20822 Contact with and (suspected) exposure to covid-19: Secondary | ICD-10-CM | POA: Insufficient documentation

## 2020-06-09 DIAGNOSIS — R0602 Shortness of breath: Secondary | ICD-10-CM

## 2020-06-09 LAB — CBC WITH DIFFERENTIAL/PLATELET
Abs Immature Granulocytes: 0.02 10*3/uL (ref 0.00–0.07)
Basophils Absolute: 0 10*3/uL (ref 0.0–0.1)
Basophils Relative: 1 %
Eosinophils Absolute: 0.1 10*3/uL (ref 0.0–0.5)
Eosinophils Relative: 2 %
HCT: 38.8 % — ABNORMAL LOW (ref 39.0–52.0)
Hemoglobin: 12.2 g/dL — ABNORMAL LOW (ref 13.0–17.0)
Immature Granulocytes: 0 %
Lymphocytes Relative: 29 %
Lymphs Abs: 2.1 10*3/uL (ref 0.7–4.0)
MCH: 29.3 pg (ref 26.0–34.0)
MCHC: 31.4 g/dL (ref 30.0–36.0)
MCV: 93 fL (ref 80.0–100.0)
Monocytes Absolute: 0.8 10*3/uL (ref 0.1–1.0)
Monocytes Relative: 10 %
Neutro Abs: 4.3 10*3/uL (ref 1.7–7.7)
Neutrophils Relative %: 58 %
Platelets: 347 10*3/uL (ref 150–400)
RBC: 4.17 MIL/uL — ABNORMAL LOW (ref 4.22–5.81)
RDW: 14.3 % (ref 11.5–15.5)
WBC: 7.3 10*3/uL (ref 4.0–10.5)
nRBC: 0 % (ref 0.0–0.2)

## 2020-06-09 LAB — URINALYSIS, ROUTINE W REFLEX MICROSCOPIC
Bilirubin Urine: NEGATIVE
Glucose, UA: NEGATIVE mg/dL
Hgb urine dipstick: NEGATIVE
Ketones, ur: NEGATIVE mg/dL
Nitrite: NEGATIVE
Protein, ur: NEGATIVE mg/dL
Specific Gravity, Urine: 1.014 (ref 1.005–1.030)
pH: 5 (ref 5.0–8.0)

## 2020-06-09 LAB — PROTIME-INR
INR: 1.1 (ref 0.8–1.2)
Prothrombin Time: 13.5 seconds (ref 11.4–15.2)

## 2020-06-09 LAB — COMPREHENSIVE METABOLIC PANEL
ALT: 34 U/L (ref 0–44)
AST: 23 U/L (ref 15–41)
Albumin: 4.1 g/dL (ref 3.5–5.0)
Alkaline Phosphatase: 79 U/L (ref 38–126)
Anion gap: 10 (ref 5–15)
BUN: 10 mg/dL (ref 6–20)
CO2: 28 mmol/L (ref 22–32)
Calcium: 9.3 mg/dL (ref 8.9–10.3)
Chloride: 103 mmol/L (ref 98–111)
Creatinine, Ser: 0.72 mg/dL (ref 0.61–1.24)
GFR, Estimated: >60 mL/min (ref >60)
Glucose, Bld: 90 mg/dL (ref 70–99)
Potassium: 3.9 mmol/L (ref 3.5–5.1)
Sodium: 141 mmol/L (ref 135–145)
Total Bilirubin: 0.6 mg/dL (ref 0.3–1.2)
Total Protein: 7.1 g/dL (ref 6.5–8.1)

## 2020-06-09 LAB — LACTIC ACID, PLASMA: Lactic Acid, Venous: 1.6 mmol/L (ref 0.5–1.9)

## 2020-06-09 LAB — APTT: aPTT: 30 seconds (ref 24–36)

## 2020-06-09 LAB — CBG MONITORING, ED: Glucose-Capillary: 107 mg/dL — ABNORMAL HIGH (ref 70–99)

## 2020-06-09 MED ORDER — LAMOTRIGINE 25 MG PO TABS
100.0000 mg | ORAL_TABLET | Freq: Every day | ORAL | Status: DC
  Administered 2020-06-10 – 2020-08-27 (×79): 100 mg via ORAL
  Filled 2020-06-09 (×83): qty 4

## 2020-06-09 MED ORDER — SULFAMETHOXAZOLE-TRIMETHOPRIM 800-160 MG PO TABS
1.0000 | ORAL_TABLET | Freq: Two times a day (BID) | ORAL | Status: AC
  Administered 2020-06-09 – 2020-06-16 (×14): 1 via ORAL
  Filled 2020-06-09 (×13): qty 1

## 2020-06-09 MED ORDER — SULFAMETHOXAZOLE-TRIMETHOPRIM 800-160 MG PO TABS: 1.0000 | ORAL_TABLET | Freq: Two times a day (BID) | ORAL | 0 refills | Status: AC

## 2020-06-09 MED ORDER — QUETIAPINE FUMARATE 100 MG PO TABS
400.0000 mg | ORAL_TABLET | Freq: Every day | ORAL | Status: DC
  Administered 2020-06-09 – 2020-08-26 (×78): 400 mg via ORAL
  Filled 2020-06-09 (×79): qty 4

## 2020-06-09 MED ORDER — OXCARBAZEPINE 150 MG PO TABS
150.0000 mg | ORAL_TABLET | Freq: Two times a day (BID) | ORAL | Status: DC
  Administered 2020-06-09 – 2020-08-27 (×152): 150 mg via ORAL
  Filled 2020-06-09 (×5): qty 1
  Filled 2020-06-09: qty 0.5
  Filled 2020-06-09 (×3): qty 1
  Filled 2020-06-09 (×2): qty 0.5
  Filled 2020-06-09 (×6): qty 1
  Filled 2020-06-09: qty 0.5
  Filled 2020-06-09 (×2): qty 1
  Filled 2020-06-09: qty 0.5
  Filled 2020-06-09 (×49): qty 1
  Filled 2020-06-09: qty 0.5
  Filled 2020-06-09 (×5): qty 1
  Filled 2020-06-09: qty 0.5
  Filled 2020-06-09 (×7): qty 1
  Filled 2020-06-09: qty 0.5
  Filled 2020-06-09 (×33): qty 1
  Filled 2020-06-09: qty 0.5
  Filled 2020-06-09 (×18): qty 1
  Filled 2020-06-09: qty 0.5
  Filled 2020-06-09: qty 1
  Filled 2020-06-09: qty 0.5
  Filled 2020-06-09 (×4): qty 1
  Filled 2020-06-09: qty 0.5
  Filled 2020-06-09 (×7): qty 1
  Filled 2020-06-09: qty 0.5
  Filled 2020-06-09 (×3): qty 1
  Filled 2020-06-09: qty 0.5
  Filled 2020-06-09 (×5): qty 1
  Filled 2020-06-09: qty 0.5
  Filled 2020-06-09 (×16): qty 1

## 2020-06-09 MED ORDER — FOLIC ACID 1 MG PO TABS
1.0000 mg | ORAL_TABLET | Freq: Every day | ORAL | Status: DC
  Administered 2020-06-09: 1 mg via ORAL

## 2020-06-09 MED ORDER — THIAMINE HCL 100 MG PO TABS
100.0000 mg | ORAL_TABLET | Freq: Every day | ORAL | Status: DC
  Administered 2020-06-10 – 2020-08-27 (×80): 100 mg via ORAL
  Filled 2020-06-09 (×81): qty 1

## 2020-06-09 MED ORDER — LAMOTRIGINE 25 MG PO TABS
100.0000 mg | ORAL_TABLET | Freq: Every day | ORAL | Status: DC
  Administered 2020-06-09: 100 mg via ORAL

## 2020-06-09 MED ORDER — DIAZEPAM 5 MG PO TABS
5.0000 mg | ORAL_TABLET | Freq: Two times a day (BID) | ORAL | Status: DC
  Administered 2020-06-09 – 2020-08-27 (×156): 5 mg via ORAL
  Filled 2020-06-09 (×156): qty 1

## 2020-06-09 MED ORDER — THIAMINE HCL 100 MG PO TABS
100.0000 mg | ORAL_TABLET | Freq: Every day | ORAL | Status: DC
  Administered 2020-06-09: 100 mg via ORAL

## 2020-06-09 MED ORDER — OXCARBAZEPINE 300 MG PO TABS
ORAL_TABLET | ORAL | Status: AC
  Filled 2020-06-09: qty 1

## 2020-06-09 MED ORDER — METOPROLOL TARTRATE 25 MG PO TABS
37.5000 mg | ORAL_TABLET | Freq: Two times a day (BID) | ORAL | Status: DC
  Administered 2020-06-09 – 2020-08-27 (×158): 37.5 mg via ORAL
  Filled 2020-06-09 (×157): qty 2

## 2020-06-09 MED ORDER — FOLIC ACID 1 MG PO TABS
1.0000 mg | ORAL_TABLET | Freq: Every day | ORAL | Status: DC
  Administered 2020-06-10 – 2020-08-27 (×79): 1 mg via ORAL
  Filled 2020-06-09 (×80): qty 1

## 2020-06-09 MED ORDER — FERROUS SULFATE 325 (65 FE) MG PO TABS
325.0000 mg | ORAL_TABLET | Freq: Every day | ORAL | Status: DC
  Administered 2020-06-10 – 2020-08-27 (×79): 325 mg via ORAL
  Filled 2020-06-09 (×79): qty 1

## 2020-06-09 MED ORDER — QUETIAPINE FUMARATE 100 MG PO TABS
300.0000 mg | ORAL_TABLET | Freq: Every morning | ORAL | Status: DC
  Administered 2020-06-10 – 2020-08-27 (×77): 300 mg via ORAL
  Filled 2020-06-09 (×74): qty 3

## 2020-06-09 MED ORDER — LACTATED RINGERS IV BOLUS (SEPSIS)
1000.0000 mL | Freq: Once | INTRAVENOUS | Status: AC
  Administered 2020-06-09: 1000 mL via INTRAVENOUS

## 2020-06-09 NOTE — ED Notes (Signed)
Patient is incontinent of bowel and bladder. Patient has a male wick on at this time. Patient had a large bowel movement. Patient bathed and complete bed change. Patient is total care.

## 2020-06-09 NOTE — ED Notes (Signed)
Male Purewick placed on pt to catch urine sample. Pt is incontinent. Mom states she did not suggest nursing staff do a in/out cath d/t pt being combative. Pt has been  Calm here in ED today. Will continue to monitor.

## 2020-06-09 NOTE — Discharge Instructions (Addendum)
You were seen in the emergency department for evaluation of increased agitation and concern for urinary tract infection.  Your urine showed possible signs of a urinary tract infection and you have completed your antibiotics.  Will be important for you to follow-up with your primary care doctor.

## 2020-06-09 NOTE — ED Provider Notes (Signed)
[facility] EMERGENCY DEPARTMENT Provider Note   CSN: [id number] Arrival date & time: 06/09/20  1605     History Chief Complaint  Patient presents with  . Fever    Isaac HeckSamuel Grussing is a 40 y.o. male.  He has a history of organic brain syndrome.  5 caveat.  He is brought in by EMS for fever and change in mental status.  Apparently he has been more combative at home, lives with mother.  EMS reports a temp of 100.6 although afebrile here.  Patient endorses cough, abdominal pain, dysuria.  He is not able to elaborate on any of those.  Unknown of COVID vaccinated.  The history is provided by the patient and the EMS personnel.  Fever Max temp prior to arrival:  100.6 Onset quality:  Unable to specify Timing:  Unable to specify Progression:  Resolved Relieved by:  None tried Worsened by:  Nothing Ineffective treatments:  None tried Associated symptoms: cough, diarrhea, dysuria and headaches   Associated symptoms: no chest pain, no rash and no sore throat        Past Medical History:  Diagnosis Date  . History of bacterial meningitis 01/01/2020  . NSTEMI (non-ST elevated myocardial infarction) (HCC) 09/28/2019    Patient Active Problem List   Diagnosis Date Noted  . Cocaine use disorder (HCC) 02/07/2020  . SVT (supraventricular tachycardia) (HCC) 01/11/2020  . Acute cystitis with hematuria   . Agitation   . Cognitive deficit due to recent stroke   . Acute urinary tract infection 01/01/2020  . History of bacterial meningitis 01/01/2020  . Abnormal ECG 01/01/2020  . Normocytic anemia 01/01/2020  . Combative behavior 12/31/2019  . Gastrostomy tube in place (HCC) 12/31/2019  . Bacterial meningitis 10/04/2019  . NSTEMI (non-ST elevated myocardial infarction) (HCC) 09/28/2019  . Demand ischemia (HCC) 09/28/2019    History reviewed. No pertinent surgical history.     No family history on file.  Social History   Tobacco Use  . Smoking status: Never Smoker  . Smokeless  tobacco: Never Used  Substance Use Topics  . Alcohol use: Not Currently  . Drug use: Not Currently    Home Medications Prior to Admission medications   Medication Sig Start Date End Date Taking? Authorizing Provider  diazepam (VALIUM) 5 MG tablet Take 1 tablet (5 mg total) by mouth every 12 (twelve) hours. 02/23/20   Vassie LollMadera, Carlos, MD  feeding supplement (ENSURE ENLIVE / ENSURE PLUS) LIQD Take 237 mLs by mouth 2 (two) times daily between meals. 02/23/20   Vassie LollMadera, Carlos, MD  ferrous sulfate 325 (65 FE) MG tablet Take 1 tablet (325 mg total) by mouth daily with breakfast. 02/24/20   Vassie LollMadera, Carlos, MD  folic acid (FOLVITE) 1 MG tablet Take 1 tablet (1 mg total) by mouth daily. 02/24/20   Vassie LollMadera, Carlos, MD  lamoTRIgine (LAMICTAL) 100 MG tablet Take 1 tablet (100 mg total) by mouth daily. 02/24/20   Vassie LollMadera, Carlos, MD  metoprolol tartrate 37.5 MG TABS Take 37.5 mg by mouth 2 (two) times daily. 02/23/20   Vassie LollMadera, Carlos, MD  OXcarbazepine (TRILEPTAL) 150 MG tablet Take 1 tablet (150 mg total) by mouth 2 (two) times daily. 02/23/20   Vassie LollMadera, Carlos, MD  polyethylene glycol (MIRALAX / GLYCOLAX) 17 g packet Take 17 g by mouth daily. Hold for diarrhea 02/24/20   Vassie LollMadera, Carlos, MD  QUEtiapine (SEROQUEL) 300 MG tablet Take 1 tablet (300 mg total) by mouth in the morning. 02/24/20   Vassie LollMadera, Carlos, MD  QUEtiapine (SEROQUEL)  400 MG tablet Take 1 tablet (400 mg total) by mouth at bedtime. 02/23/20   Vassie Loll, MD  senna-docusate (SENOKOT-S) 8.6-50 MG tablet Take 2 tablets by mouth 2 (two) times daily. Hold for diarrhea 02/23/20   Vassie Loll, MD  thiamine 100 MG tablet Take 1 tablet (100 mg total) by mouth daily. 02/24/20   Vassie Loll, MD    Allergies    Patient has no known allergies.  Review of Systems   Review of Systems  Constitutional: Positive for fever.  HENT: Negative for sore throat.   Eyes: Negative for visual disturbance.  Respiratory: Positive for cough.    Cardiovascular: Negative for chest pain.  Gastrointestinal: Positive for diarrhea.  Genitourinary: Positive for dysuria.  Musculoskeletal: Negative for neck pain.  Skin: Negative for rash.  Neurological: Positive for headaches.    Physical Exam Updated Vital Signs BP 123/87 (BP Location: Left Arm)   Pulse (!) 114   Temp 98.8 F (37.1 C) (Oral)   Resp 20   Wt 107.5 kg   SpO2 100%   BMI 30.43 kg/m   Physical Exam Vitals and nursing note reviewed.  Constitutional:      Appearance: Normal appearance. He is well-developed. He is diaphoretic.  HENT:     Head: Normocephalic and atraumatic.  Eyes:     Conjunctiva/sclera: Conjunctivae normal.  Cardiovascular:     Rate and Rhythm: Regular rhythm. Tachycardia present.     Heart sounds: No murmur heard.   Pulmonary:     Effort: Pulmonary effort is normal. No respiratory distress.     Breath sounds: Normal breath sounds.  Abdominal:     Palpations: Abdomen is soft.     Tenderness: There is no abdominal tenderness. There is no guarding or rebound.  Musculoskeletal:        General: No deformity or signs of injury. Normal range of motion.     Cervical back: Neck supple.     Right lower leg: No edema.     Left lower leg: No edema.  Skin:    General: Skin is warm.     Capillary Refill: Capillary refill takes less than 2 seconds.  Neurological:     General: No focal deficit present.     Mental Status: He is alert.     ED Results / Procedures / Treatments   Labs (all labs ordered are listed, but only abnormal results are displayed) Labs Reviewed  CBC WITH DIFFERENTIAL/PLATELET - Abnormal; Notable for the following components:      Result Value   RBC 4.17 (*)    Hemoglobin 12.2 (*)    HCT 38.8 (*)    All other components within normal limits  URINALYSIS, ROUTINE W REFLEX MICROSCOPIC - Abnormal; Notable for the following components:   Leukocytes,Ua MODERATE (*)    Bacteria, UA RARE (*)    All other components within  normal limits  CBG MONITORING, ED - Abnormal; Notable for the following components:   Glucose-Capillary 107 (*)    All other components within normal limits  CULTURE, BLOOD (ROUTINE X 2)  CULTURE, BLOOD (ROUTINE X 2)  SARS CORONAVIRUS 2 (TAT 6-24 HRS)  URINE CULTURE  LACTIC ACID, PLASMA  COMPREHENSIVE METABOLIC PANEL  PROTIME-INR  APTT    EKG None  Radiology DG Chest Port 1 View  Result Date: 06/09/2020 CLINICAL DATA:  Fever and change in mental status EXAM: PORTABLE CHEST 1 VIEW COMPARISON:  None. FINDINGS: The heart size and mediastinal contours are within normal limits. Both hazy  airspace opacity seen at the periphery of the left lung. The visualized skeletal structures are unremarkable. IMPRESSION: Hazy airspace opacity at the periphery of the left lung which could be due to atelectasis or infectious etiology. Electronically Signed   By: Jonna Clark M.D.   On: 06/09/2020 17:48    Procedures Procedures   Medications Ordered in ED Medications  sulfamethoxazole-trimethoprim (BACTRIM DS) 800-160 MG per tablet 1 tablet (1 tablet Oral Given 06/10/20 1032)  diazepam (VALIUM) tablet 5 mg (5 mg Oral Given 06/10/20 1032)  ferrous sulfate tablet 325 mg (325 mg Oral Given 06/10/20 0917)  metoprolol tartrate (LOPRESSOR) tablet 37.5 mg (37.5 mg Oral Given 06/10/20 1031)  OXcarbazepine (TRILEPTAL) tablet 150 mg (150 mg Oral Given 06/10/20 1032)  QUEtiapine (SEROQUEL) tablet 300 mg (300 mg Oral Given 06/10/20 0916)  QUEtiapine (SEROQUEL) tablet 400 mg (400 mg Oral Given 06/09/20 2201)  folic acid (FOLVITE) tablet 1 mg (1 mg Oral Given 06/10/20 1032)  lamoTRIgine (LAMICTAL) tablet 100 mg (100 mg Oral Given 06/10/20 1031)  thiamine tablet 100 mg (100 mg Oral Given 06/10/20 1032)  lactated ringers bolus 1,000 mL (0 mLs Intravenous Stopped 06/09/20 2044)    ED Course  I have reviewed the triage vital signs and the nursing notes.  Pertinent labs & imaging results that were available during my care of the  patient were reviewed by me and considered in my medical decision making (see chart for details).  Clinical Course as of 06/10/20 1139  Wed Jun 09, 2020  1728 Chest x-ray interpreted by me as poor inspiration, no acute infiltrates. [MB]  1756 Radiology: Hazy opacity left base which may be infectious [MB]  2106 The charge nurse called the patient's mother who is his guardian to ask that she come pick him up.  She is refusing to pick him up and says she cannot care for him any longer.  He will be a border here and get a social work consult in the morning. [MB]    Clinical Course User Index [MB] Terrilee Files, MD   MDM Rules/Calculators/A&P                           Isaac [name] was evaluated in Emergency Department on 06/09/2020 for the symptoms described in the history of present illness. He was evaluated in the context of the global COVID-19 pandemic, which necessitated consideration that the patient might be at risk for infection with the SARS-CoV-2 virus that causes COVID-19. Institutional protocols and algorithms that pertain to the evaluation of patients at risk for COVID-19 are in a state of rapid change based on information released by regulatory bodies including the CDC and federal and state organizations. These policies and algorithms were followed during the patient's care in the ED.  This patient complains of possible fever, agitation, cough, possible urinary tract infection; this involves an extensive number of treatment Options and is a complaint that carries with it a high risk of complications and Morbidity. The differential includes UTI, pneumonia, COVID, metabolic derangement  I ordered, reviewed and interpreted labs, which included CBC with normal white count, stable low hemoglobin,Urinalysis with possible signs of action, chemistries and LFTs normal I ordered medication IV fluids, antibiotics and home medication I ordered imaging studies which included chest x-ray and I  independently    visualized and interpreted imaging which showed no acute findings Additional history obtained from EMS Previous records obtained and reviewed in epic, patient had a prolonged  ED stay for similar presentation of agitation past I consulted transitions of care and discussed lab and imaging findings  Critical Interventions: None  After the interventions stated above, I reevaluated the patient and found patient to be hemodynamically stable and asymptomatic.  Unfortunately he is unable to return home to do his guardian/mother unwilling to accept him.  Social work is contacting with Adult Management consultant.  The patient will need to board in the department until appropriate disposition can be arranged.  Final Clinical Impression(s) / ED Diagnoses Final diagnoses:  Acute cystitis without hematuria  Agitation  Person under investigation for COVID-19    Rx / DC Orders ED Discharge Orders    None       Terrilee Files, MD 06/10/20 1143

## 2020-06-09 NOTE — ED Notes (Signed)
Spoke with patient mother Mrs. Smith who cares for patient at home. Mrs. Katrinka Blazing states he cannot come home with her because she can no longer take care of him. Charge nurse notified at this time.
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
## 2020-06-09 NOTE — ED Notes (Signed)
MSE waiver not signed due to ams.

## 2020-06-09 NOTE — ED Triage Notes (Signed)
Pt arrived via [employer].  Rescue reports pt has had fever and ams.  Reports ams is normal but has been "acting out of sorts and getting comabtive" recently.  ALso reports temp 100.6 with ems.  EMS unsure when symptoms started.  PT alert. Pt denies any pain.  Pt oriented to self.

## 2020-06-10 LAB — SARS CORONAVIRUS 2 (TAT 6-24 HRS): SARS Coronavirus 2: NEGATIVE

## 2020-06-10 NOTE — ED Notes (Signed)
Pt ate 100% of meal.

## 2020-06-10 NOTE — ED Notes (Signed)
Rounded on pt. Pt removed all leads and pulled out IV. Pt stated that he didn't know why he did, he just did it. Pt not bleeding.  Pt's brief was checked and is not soiled at this time. Male wick re-secured and brief applied. Pt denied any other needs at this time. Call bell within reach, bed in low position. Will continue to monitor.

## 2020-06-10 NOTE — ED Notes (Signed)
Kym Groom, SW with APS on unit for visit with pt; she reports pt will need FL2 started for nursing home placement; SW, Bria, sent a secure chat to notify

## 2020-06-10 NOTE — ED Notes (Signed)
Pt repositioned in bed; brief is clean and dry

## 2020-06-10 NOTE — Progress Notes (Signed)
Pts fl2 has been faxed to APS, caseworker Lyla Son. CSW also sent out signed fl2 to all avaliable facilities. TOC will update as bed offers are made.

## 2020-06-10 NOTE — NC FL2 (Addendum)
[place] MEDICAID FL2 LEVEL OF CARE SCREENING TOOL     IDENTIFICATION  Patient Name: Isaac [name] Birthdate: 1980-07-06 Sex: male Admission Date (Current Location): 06/09/2020  [facility] and [place] Number:  [employer] and Address:  [facility],  [address], [place] [address]      Provider Number: [id number]  Attending Physician Name and Address:  Default, Provider, MD  Relative Name and Phone Number:  Smith,JoAnn Mother     [phone]    Current Level of Care: Hospital Recommended Level of Care: Skilled Nursing Facility,Nursing Facility (Long Term Care (LTC)) Prior Approval Number:    Date Approved/Denied: 06/10/20 PASRR Number: [id number]  Discharge Plan: SNF    Current Diagnoses: Patient Active Problem List   Diagnosis Date Noted  . Cocaine use disorder (HCC) 02/07/2020  . SVT (supraventricular tachycardia) (HCC) 01/11/2020  . Acute cystitis with hematuria   . Agitation   . Cognitive deficit due to recent stroke   . Acute urinary tract infection 01/01/2020  . History of bacterial meningitis 01/01/2020  . Abnormal ECG 01/01/2020  . Normocytic anemia 01/01/2020  . Combative behavior 12/31/2019  . Gastrostomy tube in place (HCC) 12/31/2019  . Bacterial meningitis 10/04/2019  . NSTEMI (non-ST elevated myocardial infarction) (HCC) 09/28/2019  . Demand ischemia (HCC) 09/28/2019    Orientation RESPIRATION BLADDER Height & Weight     Self  Normal Incontinent Weight: 237 lb (107.5 kg) Height:     BEHAVIORAL SYMPTOMS/MOOD NEUROLOGICAL BOWEL NUTRITION STATUS      Continent Diet  AMBULATORY STATUS COMMUNICATION OF NEEDS Skin   Total Care Verbally (Limitted Verbal communication) Normal                       Personal Care Assistance Level of Assistance  Bathing,Feeding,Dressing,Total care Bathing Assistance: Maximum assistance Feeding assistance: Maximum assistance Dressing Assistance: Maximum assistance Total Care Assistance:  Maximum assistance   Functional Limitations Info  Sight,Hearing,Speech Sight Info: Adequate Hearing Info: Adequate Speech Info: Impaired    SPECIAL CARE FACTORS FREQUENCY                       Contractures Contractures Info: Not present    Additional Factors Info                  Current Medications (06/10/2020):  This is the current hospital active medication list Current Facility-Administered Medications  Medication Dose Route Frequency Provider Last Rate Last Admin  . diazepam (VALIUM) tablet 5 mg  5 mg Oral Q12H Terrilee Files, MD   5 mg at 06/10/20 1032  . ferrous sulfate tablet 325 mg  325 mg Oral Q breakfast Terrilee Files, MD   325 mg at 06/10/20 8891  . folic acid (FOLVITE) tablet 1 mg  1 mg Oral Daily Terrilee Files, MD   1 mg at 06/10/20 1032  . lamoTRIgine (LAMICTAL) tablet 100 mg  100 mg Oral Daily Terrilee Files, MD   100 mg at 06/10/20 1031  . metoprolol tartrate (LOPRESSOR) tablet 37.5 mg  37.5 mg Oral BID Terrilee Files, MD   37.5 mg at 06/10/20 1031  . OXcarbazepine (TRILEPTAL) tablet 150 mg  150 mg Oral BID Terrilee Files, MD   150 mg at 06/10/20 1032  . QUEtiapine (SEROQUEL) tablet 300 mg  300 mg Oral q AM Terrilee Files, MD   300 mg at 06/10/20 0916  . QUEtiapine (SEROQUEL) tablet 400 mg  400 mg Oral QHS Terrilee Files, MD   400 mg at 06/09/20 2201  . sulfamethoxazole-trimethoprim (BACTRIM DS) 800-160 MG per tablet 1 tablet  1 tablet Oral BID Terrilee Files, MD   1 tablet at 06/10/20 1032  . thiamine tablet 100 mg  100 mg Oral Daily Terrilee Files, MD   100 mg at 06/10/20 1032   Current Outpatient Medications  Medication Sig Dispense Refill  . diazepam (VALIUM) 5 MG tablet Take 1 tablet (5 mg total) by mouth every 12 (twelve) hours. 60 tablet 0  . ferrous sulfate 325 (65 FE) MG tablet Take 1 tablet (325 mg total) by mouth daily with breakfast. 30 tablet 3  . folic acid (FOLVITE) 1 MG tablet Take 1 tablet (1 mg total) by  mouth daily. 30 tablet 1  . lamoTRIgine (LAMICTAL) 100 MG tablet Take 1 tablet (100 mg total) by mouth daily. 30 tablet 1  . metoprolol tartrate 37.5 MG TABS Take 37.5 mg by mouth 2 (two) times daily. 60 tablet 2  . OXcarbazepine (TRILEPTAL) 150 MG tablet Take 1 tablet (150 mg total) by mouth 2 (two) times daily. 60 tablet 1  . polyethylene glycol (MIRALAX / GLYCOLAX) 17 g packet Take 17 g by mouth daily. Hold for diarrhea 30 each 1  . QUEtiapine (SEROQUEL) 300 MG tablet Take 1 tablet (300 mg total) by mouth in the morning. 30 tablet 1  . QUEtiapine (SEROQUEL) 400 MG tablet Take 1 tablet (400 mg total) by mouth at bedtime. 30 tablet 1  . senna-docusate (SENOKOT-S) 8.6-50 MG tablet Take 2 tablets by mouth 2 (two) times daily. Hold for diarrhea 60 tablet 1  . sulfamethoxazole-trimethoprim (BACTRIM DS) 800-160 MG tablet Take 1 tablet by mouth 2 (two) times daily for 7 days. 14 tablet 0  . thiamine 100 MG tablet Take 1 tablet (100 mg total) by mouth daily. 30 tablet 1  . feeding supplement (ENSURE ENLIVE / ENSURE PLUS) LIQD Take 237 mLs by mouth 2 (two) times daily between meals. (Patient not taking: No sig reported)       Discharge Medications: Please see discharge summary for a list of discharge medications.  Relevant Imaging Results:  Relevant Lab Results:   Additional Information Pt SSN: [id number]  Barry Brunner, LCSW

## 2020-06-10 NOTE — ED Notes (Signed)
Pt stable, pt hold for SW and placement- will obtain vitals per protocol.

## 2020-06-10 NOTE — ED Notes (Signed)
Linens and gown changed, pericare preformed.

## 2020-06-10 NOTE — ED Notes (Signed)
Pt's Mother updated on pt status and APS visited today with placement being sought, Mother verbalized understanding and expresses thanks for care being provided

## 2020-06-11 NOTE — ED Notes (Signed)
Pt had moved down to end of bed and was sitting up. Pt incontinent of urine. Hygiene care performed. Clean linen on bed; gown changed. Pt repositioned back up in bed in position of comfort and safety. External purewick remains in place. Call bell in reach. New brief in place. No additional needs at this time. Pt provided new warm blanket. Will continue to monitor.

## 2020-06-11 NOTE — ED Notes (Signed)
Pt watching TV. No additional needs voiced at this time. Will continue to monitor.

## 2020-06-11 NOTE — ED Notes (Signed)
Pt provided dinner tray, sitting up in bed eating. No additional needs voiced at this time.

## 2020-06-11 NOTE — TOC Progression Note (Signed)
[facility] has made a bed offer for pt. CSW updated family, family is agreeable. CSW spoke to [place] with [facility], she will begin the [place] process. CSW asked that updates be provided as Eunice Blase has them. CSW to update ED staff also. TOC to follow.

## 2020-06-11 NOTE — ED Notes (Signed)
Pt given evening medications per MAR. Pt accepted without issue. Changed into new gown. Pt has no additional needs voiced at this time. External foley remains present and functioning. Brief dry. Bed low and locked, call bell in reach.

## 2020-06-12 LAB — URINE CULTURE: Culture: 30000 — AB

## 2020-06-12 NOTE — ED Notes (Signed)
Pt found to be soiled with urine and stool. Cleaned patient, placed in new brief and gown, bed linens changed. Transferred x2 with stedy to chair. Able to bear weight on lower extremities, but unable to take any steps. Pt comfortable, in chair, wheels locked and call bell within reach.

## 2020-06-12 NOTE — ED Notes (Signed)
Pt found unwrapping supplies in room, pt redirected and supplies put up in cabinet.

## 2020-06-12 NOTE — ED Notes (Signed)
Patient placed back to bed from chair with assist x2. Brief changed, peri care performed, and external catheter replaced. Pt repositioned for comfort.

## 2020-06-12 NOTE — ED Notes (Signed)
Update given to mom 

## 2020-06-12 NOTE — ED Notes (Signed)
Pt sitting on side of bed watching tv

## 2020-06-12 NOTE — ED Notes (Signed)
Pt feeding self lunch,

## 2020-06-14 ENCOUNTER — Encounter (HOSPITAL_COMMUNITY): Payer: Self-pay

## 2020-06-14 LAB — CULTURE, BLOOD (ROUTINE X 2)
Culture: NO GROWTH
Culture: NO GROWTH

## 2020-06-14 NOTE — Progress Notes (Signed)
CSW made call to [facility] with [facility], left VM asking for updates. CSW made call to [facility], informed Eunice Blase was out for the day and there was no one in the facility at the time that would be able to provide CSW with update.

## 2020-06-14 NOTE — ED Notes (Signed)
Patient placed back to bed from chair with assist x3. Pt brief and external catheter replaced.

## 2020-06-14 NOTE — ED Notes (Signed)
Pt accepted PM medications without issue. Resting on stretcher watching TV. RR even and nonlabored. Linens changed, hygiene care completed.
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
## 2020-06-15 NOTE — TOC Progression Note (Signed)
Transition of Care [facility]) - Progression Note    Patient Details  Name: Korie Brabson MRN: [id number] Date of Birth: 1981/03/06  [facility] CM/SW Contact  Marina Goodell Phone Number: [phone] 06/15/2020, 1:55 PM  Clinical Narrative:     CSW contacted and left voicemail for Debbie at [facility] about bed offer for long term care placement.  Debbie left home for the day and there was no one else who could assist with this matter.        Expected Discharge Plan and Services                                                 Social Determinants of Health (SDOH) Interventions    Readmission Risk Interventions Readmission Risk Prevention Plan 02/23/2020  PCP or Specialist Appt within 3-5 Days Complete

## 2020-06-15 NOTE — ED Notes (Signed)
Pt is resting in bed and calm at this time. Pt denies any needs. Will assess vitals every 8 hours. Will continue to monitor pt.

## 2020-06-15 NOTE — TOC Progression Note (Signed)
CSW made another call to [facility] to inquire about [facility], CSW spoke with Rosey Bath who is states she ia covering for Debbie in admissions. Pt has now been declined from [facility] due to not being in network and the bed offer has been rescinded. Pt currently has no other bed offers. CSW made call to [facility] and they do not have any long term male beds. CSW reached out to [place] with the [facility] in [place]. She states they have some long term beds and she would look over the referral. CSW faxed referral to Amanda at [phone]. Wynona Canes confirmed she received the referral and would have it looked over in the morning as the person needed to look over it is out for the day. CSW to f/u for updates.

## 2020-06-15 NOTE — ED Notes (Signed)
Patient assisted x 2 RN & NT to recliner in room. Up to eat breakfast, gown changed, external canister changed. ER stretcher changed to a regular hospital bed at this time.

## 2020-06-16 NOTE — TOC Progression Note (Signed)
CSW spoke with Judeth Cornfield with APS, she inquired about bed offers, CSW informed her that [facility] had rescinded offer and quite a few facilities have denied due to not being in network. Judeth Cornfield states she will call Medicaid tomorrow to find out why he is not in network. CSW will f/u with the [facility] as they were going to run pts Medicaid to see if it was in network. TOC to follow.

## 2020-06-16 NOTE — ED Notes (Signed)
Sitting in room watching TV in bed

## 2020-06-16 NOTE — Plan of Care (Signed)
  Problem: Acute Rehab PT Goals(only PT should resolve) Goal: Pt Will Go Supine/Side To Sit Outcome: Progressing Flowsheets (Taken 06/16/2020 1513) Pt will go Supine/Side to Sit: with minimal assist Goal: Patient Will Transfer Sit To/From Stand Outcome: Progressing Flowsheets (Taken 06/16/2020 1513) Patient will transfer sit to/from stand:  with min guard assist  with minimal assist Goal: Pt Will Transfer Bed To Chair/Chair To Bed Outcome: Progressing Flowsheets (Taken 06/16/2020 1513) Pt will Transfer Bed to Chair/Chair to Bed: with min assist Goal: Pt Will Ambulate Outcome: Progressing Flowsheets (Taken 06/16/2020 1513) Pt will Ambulate:  75 feet  with minimal assist  with rolling walker   3:13 PM, 06/16/20 Ocie Bob, MPT Physical Therapist with [facility] [phone] office [phone] mobile phone

## 2020-06-16 NOTE — Evaluation (Signed)
Physical Therapy Evaluation Patient Details Name: Isaac [name] MRN: [id number] DOB: [birth year] Today's Date: 06/16/2020   History of Present Illness  Isaac [name] is a 40 y.o. male.  He has a history of organic brain syndrome.  5 caveat.  He is brought in by EMS for fever and change in mental status.  Apparently he has been more combative at home, lives with mother.  EMS reports a temp of 100.6 although afebrile here.  Patient endorses cough, abdominal pain, dysuria.  He is not able to elaborate on any of those.  Unknown of COVID v    Clinical Impression  Patient demonstrates slow labored movement for sitting up at bedside, unsteady on feet and at high risk for falls especially during transfers to chair mostly due to requiring repeated verbal/tactile cueing with fair/poor carryover with tendency to attempt sitting when not close enough to chair.  Patient able to ambulate into hallway, but has difficulty making turns due to scissoring of legs requiring tactile assistance to move RW in order for patient to complete turns.  Patient tolerated sitting up in chair after therapy with family member present in room - RN notified.  Patient will benefit from continued physical therapy in hospital and recommended venue below to increase strength, balance, endurance for safe ADLs and gait.      Follow Up Recommendations SNF;Supervision for mobility/OOB;Supervision/Assistance - 24 hour    Equipment Recommendations  None recommended by PT    Recommendations for Other Services       Precautions / Restrictions Precautions Precautions: Fall Restrictions Weight Bearing Restrictions: No      Mobility  Bed Mobility Overal bed mobility: Needs Assistance Bed Mobility: Supine to Sit     Supine to sit: Mod assist     General bed mobility comments: increased time, labored movement    Transfers Overall transfer level: Needs assistance Equipment used: Rolling walker (2 wheeled) Transfers: Sit  to/from [employer] Sit to Stand: Min assist Stand pivot transfers: Mod assist       General transfer comment: requires repeated tactile/verbal cueing to transfer to chair with fair/poor carryover  Ambulation/Gait Ambulation/Gait assistance: Min assist;Mod assist Gait Distance (Feet): 40 Feet Assistive device: Rolling walker (2 wheeled) Gait Pattern/deviations: Decreased step length - right;Decreased step length - left;Decreased stride length;Scissoring Gait velocity: decreased   General Gait Details: slow labored cadence requiring tactile cueing to turn RW when making turns, has tendency to scissor legs when turning  Stairs            Wheelchair Mobility    Modified Rankin (Stroke Patients Only)       Balance                                             Pertinent Vitals/Pain Pain Assessment: No/denies pain    Home Living Family/patient expects to be discharged to:: Private residence Living Arrangements: Parent;Non-relatives/Friends Available Help at Discharge: Family;Available 24 hours/day Type of Home: House Home Access: Ramped entrance     Home Layout: One level Home Equipment: Shower seat - built in;Walker - 2 wheels      Prior Function Level of Independence: Needs assistance   Gait / Transfers Assistance Needed: One person hand held assistance for short household distances  ADL's / Homemaking Assistance Needed: assisted by family, takes bed baths        Hand Dominance  Dominant Hand: Right    Extremity/Trunk Assessment   Upper Extremity Assessment Upper Extremity Assessment: Overall WFL for tasks assessed    Lower Extremity Assessment Lower Extremity Assessment: Generalized weakness    Cervical / Trunk Assessment Cervical / Trunk Assessment: Normal  Communication   Communication: Expressive difficulties  Cognition Arousal/Alertness: Awake/alert Behavior During Therapy: WFL for tasks  assessed/performed Overall Cognitive Status: History of cognitive impairments - at baseline                                        General Comments      Exercises     Assessment/Plan    PT Assessment Patient needs continued PT services  PT Problem List Decreased strength;Decreased activity tolerance;Decreased balance;Decreased mobility       PT Treatment Interventions Gait training;DME instruction;Stair training;Functional mobility training;Therapeutic activities;Therapeutic exercise;Balance training;Patient/family education    PT Goals (Current goals can be found in the Care Plan section)  Acute Rehab PT Goals Patient Stated Goal: not stated PT Goal Formulation: With patient/family Time For Goal Achievement: 06/30/20 Potential to Achieve Goals: Good    Frequency Min 2X/week   Barriers to discharge        Co-evaluation               AM-PAC PT "6 Clicks" Mobility  Outcome Measure Help needed turning from your back to your side while in a flat bed without using bedrails?: A Lot Help needed moving from lying on your back to sitting on the side of a flat bed without using bedrails?: A Lot Help needed moving to and from a bed to a chair (including a wheelchair)?: A Lot Help needed standing up from a chair using your arms (e.g., wheelchair or bedside chair)?: A Little Help needed to walk in hospital room?: A Lot Help needed climbing 3-5 steps with a railing? : A Lot 6 Click Score: 13    End of Session   Activity Tolerance: Patient tolerated treatment well;Patient limited by fatigue Patient left: in chair;with call bell/phone within reach;with family/visitor present Nurse Communication: Mobility status PT Visit Diagnosis: Unsteadiness on feet (R26.81);Other abnormalities of gait and mobility (R26.89);Muscle weakness (generalized) (M62.81)    Time: 1478-2956 PT Time Calculation (min) (ACUTE ONLY): 30 min   Charges:   PT Evaluation $PT Eval  Moderate Complexity: 1 Mod PT Treatments $Therapeutic Activity: 23-37 mins        3:11 PM, 06/16/20 Ocie Bob, MPT Physical Therapist with [facility] [phone] office [phone] mobile phone

## 2020-06-16 NOTE — ED Provider Notes (Signed)
Emergency Medicine Observation Re-evaluation Note  Isaac [name] is a 40 y.o. male, seen on rounds today.  Pt initially presented to the ED for complaints of low grade fever, and recent agitated behavior.  Currently, the patient is calm, resting.   Physical Exam  BP 129/68   Pulse 78   Temp 98.4 F (36.9 C)   Resp 17   Wt 107.5 kg   SpO2 97%   BMI 30.43 kg/m  Physical Exam General: resting Cardiac: regular rate Lungs: breathing comfortably Psych: resting, calm.  ED Course / MDM  EKG:   I have reviewed the labs performed to date as well as medications administered while in observation.  Recent changes in the last 24 hours include continued stabilization on meds, tx for possible uti.   Plan  Current plan is for TOC placement, continued stabilization on meds.   The patient has been placed in psychiatric observation due to the need to provide a safe environment for the patient while obtaining psychiatric consultation and evaluation, as well as ongoing medical and medication management to treat the patient's condition.     Cathren Laine, MD 06/16/20 [phone]

## 2020-06-16 NOTE — ED Notes (Signed)
Pt resting comfortably at this time, watching TV. Denies any needs currently. Will continue to monitor.

## 2020-06-17 ENCOUNTER — Emergency Department (HOSPITAL_COMMUNITY): Payer: Medicaid Other

## 2020-06-17 NOTE — ED Provider Notes (Addendum)
Pt boarder here waiting for SNF placement, found sitting on the floor in his room in from of the chair he had been in.  Incontinent of urine and stool.  Required assistance to get into bed, but has reduced mobility at baseline.  Complaint of pain in the left pelvis/hip.  No other complaint of pain.   Pelvis xrays ordered.  Reviewed and negative for acute fracture.   Burgess Amor, PA-C 06/17/20 2025    Burgess Amor, PA-C 06/17/20 2026    Eber Hong, MD 06/25/20 [phone]

## 2020-06-17 NOTE — ED Notes (Signed)
Pt found on floor, pt returned to bed, Burgess Amor, PA at bedside evaluating patient. No obvious injuries observed.

## 2020-06-17 NOTE — ED Notes (Signed)
Pt up to the recliner. Given some drink at this time.

## 2020-06-17 NOTE — TOC Progression Note (Signed)
Transition of Care [facility]) - Progression Note    Patient Details  Name: Isaac [name] MRN: [id number] Date of Birth: 1980/09/06  [facility]) CM/SW Contact  Barry Brunner, LCSW Phone Number: 06/17/2020, 1:11 PM  Clinical Narrative:    CSW spoke with Judeth Cornfield with DSS concerning placement. Judeth Cornfield requested CSW to provide background information to [facility]. CSW discussed patient's disposition with Jake Shark of [facility]. Jake Shark reported that given that patient doesn't have a billed history of psychiatric services and did not have an IDD diagnosis before the age of 69, they did not believe they would be able to take patient. 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 Jake Shark reported that he has excalated the case for further review, but did not believe patient could be admitted to facility. CSW notified DSS. CSW inquired to [place] with DSS if they had submitted for guardianship. Judeth Cornfield reported that that will be done on Monday 06/21/2020. TOC to follow        Expected Discharge Plan and Services                                                 Social Determinants of Health (SDOH) Interventions    Readmission Risk Interventions Readmission Risk Prevention Plan 02/23/2020  PCP or Specialist Appt within 3-5 Days Complete

## 2020-06-17 NOTE — ED Notes (Signed)
Patient is resting comfortably in recliner,  Unable to obtained vital signs at this time.

## 2020-06-18 NOTE — ED Notes (Signed)
Pt in bed watching tv, pt states that he is dry and pure wick seems to be working well, pt requests a drink, beverage given.  Pt has no other complaints at this time.

## 2020-06-18 NOTE — ED Notes (Signed)
Pt resting comfortably in bed, VS updated and stable. Pt provided soda as requested, ate 100% of his dinner tray. primofit male in place, functioning without complication. Pt denies further needs at this time. Side rails up x 2, bed locked and low, call bell within reach. Will continue to monitor.

## 2020-06-18 NOTE — ED Notes (Signed)
Patient in recliner bed alarm set.

## 2020-06-18 NOTE — ED Notes (Signed)
Mom at bedside.

## 2020-06-18 NOTE — ED Notes (Addendum)
Pt assisted back into the bed from recliner with one staff member and walker. Peri care performed and new male pure wick placed on patient taped to left leg. Bed in lowest position and locked bed alarm turned on.

## 2020-06-18 NOTE — ED Notes (Signed)
Pt in bed watching tv  

## 2020-06-19 NOTE — ED Notes (Signed)
Pt repositioning to end of chair, bed alarm sounded and reset, pt repositioned, reclined with legs elevated. Provided with soda, bedside table and call bell within reach. Denies further needs at this time.

## 2020-06-19 NOTE — ED Notes (Signed)
Pt medicated per MAR, resting quietly in bed, VS updated and stable. Pt denies further needs at this time. Bed alarm ON, bed locked and low, call bell within reach. Condom cath functioning without complication, brief in place and dry at this time. Soda provided and at bedside. Will continue to monitor.

## 2020-06-19 NOTE — ED Notes (Signed)
Breakfast tray given to pt 

## 2020-06-19 NOTE — ED Provider Notes (Addendum)
Emergency Medicine Observation Re-evaluation Note  Isaac [name] is a 40 y.o. male, seen on rounds today.  Pt initially presented to the ED for complaints of Fever Currently, the patient is sleeping.  Physical Exam  BP 110/73   Pulse 71   Temp 97.6 F (36.4 C) (Oral)   Resp 16   Wt 107.5 kg   SpO2 96%   BMI 30.43 kg/m  Physical Exam General: no distress Cardiac: regular rate Lungs: no resp distress Psych: calm  ED Course / MDM  EKG:   I have reviewed the labs performed to date as well as medications administered while in observation.  Recent changes in the last 24 hours include : none.  Plan  Current plan is for awaiting placement for safe living. Old labs and hemodynamic reviewed. Patient is not under full IVC at this time.   LAST TOC note, copied from encounter on 06/17/20, is as below: Clinical Narrative:    CSW spoke with Judeth Cornfield with DSS concerning placement. Judeth Cornfield requested CSW to provide background information to [facility]. CSW discussed patient's disposition with Jake Shark of [facility]. Jake Shark reported that given that patient doesn't have a billed history of psychiatric services and did not have an IDD diagnosis before the age of 62, they did not believe they would be able to take patient. 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 Jake Shark reported that he has excalated the case for further review, but did not believe patient could be admitted to facility. CSW notified DSS. CSW inquired to [place] with DSS if they had submitted for guardianship. Judeth Cornfield reported that that will be done on Monday 06/21/2020. TOC to follow    Derwood Kaplan, MD 06/19/20 [phone]

## 2020-06-20 MED ORDER — SENNOSIDES-DOCUSATE SODIUM 8.6-50 MG PO TABS
1.0000 | ORAL_TABLET | Freq: Every day | ORAL | Status: DC
  Administered 2020-06-20 – 2020-08-26 (×65): 1 via ORAL
  Filled 2020-06-20 (×65): qty 1

## 2020-06-20 NOTE — ED Notes (Addendum)
Got pt up from chair, cleaned pt groin area and removed pure wick, no redness noted to buttocks or legs, pt ambulatory down the hallway with walker, two RNs with pt, pt needed minimal/ no support during ambulation.  Helped pt back into bed, scds placed on bilateral lower extremities, replaced pure wick, warm blanket placed, pt states that he has no other requests at this time.

## 2020-06-20 NOTE — ED Provider Notes (Signed)
Emergency Medicine Observation Re-evaluation Note  Isaac [name] is a 40 y.o. male, seen on rounds today.  Pt initially presented to the ED for complaints of Fever Currently, the patient is comfortable.  Physical Exam  BP 121/71 (BP Location: Right Arm)   Pulse 74   Temp 97.6 F (36.4 C) (Oral)   Resp 17   Wt 107.5 kg   SpO2 100%   BMI 30.43 kg/m  Physical Exam General: No acute distress Cardiac: Regular rate Lungs: No respiratory distress Psych: Calm  ED Course / MDM  EKG:   I have reviewed the labs performed to date as well as medications administered while in observation.  Recent changes in the last 24 hours include no escalation in agitation.  Plan  Current plan is for place for neck steps and placement.  At the request of the nursing staff, Colace 100 mg ordered.  They are also concerned about DVT prophylaxis, SCDs ordered.  Patient is not under full IVC at this time.   Derwood Kaplan, MD 06/20/20 1444

## 2020-06-20 NOTE — ED Notes (Addendum)
Pt voided approx. , canister emptied, pt dried and cleaned. Pt repositioned in bed, reclined with legs elevated. Provided with sprite per request, bedside table and call bell within reach. Lights dimmed and pt watching television. Denies any further needs at this time.

## 2020-06-20 NOTE — ED Notes (Signed)
Gave pt breakfast tray

## 2020-06-20 NOTE — ED Notes (Signed)
Pt finished breakfast, pt states that he is wet and needs to get cleaned up, cleaned pt, changed bedding, male pure wick placed, pt helped to chair, pt on bed alarm, talked with md about daily pt and getting pt up and walking.

## 2020-06-20 NOTE — ED Notes (Signed)
Pt appears to be resting comfortably. Bed locked in lowest position, call light within reach. Pt is a boarder and vitals will be assessed at least every 8 hours per policy.

## 2020-06-20 NOTE — ED Notes (Signed)
Cleaned pts face, got pt up with second rn, walker used, pt walked from room to nurses station with assistance, pt helped back into bed, pt offers no complaints.

## 2020-06-20 NOTE — ED Notes (Signed)
Pt in chair, mom at bedside, pt moved from bed to chair, pt offers no complaints at this time.

## 2020-06-21 NOTE — ED Notes (Signed)
Pt ambulated half way around nurses station with a walker and two nurses beside the pt. Pt ambulated well. Pt bed changed and pt placed back into bedside chair. pts SCD's intact and working. Will continue to monitor patient .

## 2020-06-21 NOTE — ED Notes (Signed)
800 ML of urine

## 2020-06-21 NOTE — ED Notes (Signed)
Pt ambulated in the hall and back to his room. Pt lying in bed at this time resting. Bed lowered and locked with call bell in reach.

## 2020-06-21 NOTE — TOC Progression Note (Signed)
Transition of Care [facility]) - Progression Note    Patient Details  Name: Isaac [name] MRN: [id number] Date of Birth: November 14, 1980  [facility] CM/SW Contact  Barry Brunner, LCSW Phone Number: 06/21/2020, 4:43 PM  Clinical Narrative:    CSW contacted [facility] with DSS. Judeth Cornfield reported that she will be filing the petition tomorrow. CSW followed up the the [facility] for referral. Admissions with the [facility] reported that she was not aware of the referral and requested it to be sent again. CSW faxed referral to provided number [phone]. Judeth Cornfield notified CSW that patient will Standard Medicaid May 1st. CSW attempted to notify the [facility] of Medicaid status. Admissions not available at time of call. CSW will follow up with the [facility] on 06/21/2020. TOC to follow.         Expected Discharge Plan and Services                                                 Social Determinants of Health (SDOH) Interventions    Readmission Risk Interventions Readmission Risk Prevention Plan 02/23/2020  PCP or Specialist Appt within 3-5 Days Complete

## 2020-06-22 NOTE — ED Notes (Signed)
Patient ambulated with physical therapy.

## 2020-06-22 NOTE — Progress Notes (Signed)
Physical Therapy Treatment Patient Details Name: Neville Walston MRN: [id number] DOB: [birth year] Today's Date: 06/22/2020    History of Present Illness Taeveon Keesling is a 40 y.o. male.  He has a history of organic brain syndrome.  5 caveat.  He is brought in by EMS for fever and change in mental status.  Apparently he has been more combative at home, lives with mother.  EMS reports a temp of 100.6 although afebrile here.  Patient endorses cough, abdominal pain, dysuria.  He is not able to elaborate on any of those.  Unknown of COVID v    PT Comments    Patient presents seated in chair (assisted by nursing staff) and agreeable for therapy.  Patient demonstrates increased endurance/distance for gait training, has most difficulty making turns with RW requiring tactile assistance to turn RW for him, poor carryover for stepping backwards when attempting to sit in chair, tends to sit before reaching chair and tolerated staying up in chair after therapy.  Patient will benefit from continued physical therapy in hospital and recommended venue below to increase strength, balance, endurance for safe ADLs and gait.    Follow Up Recommendations  SNF;Supervision for mobility/OOB;Supervision/Assistance - 24 hour     Equipment Recommendations  None recommended by PT    Recommendations for Other Services       Precautions / Restrictions Precautions Precautions: Fall Restrictions Weight Bearing Restrictions: No    Mobility  Bed Mobility Overal bed mobility: Needs Assistance             General bed mobility comments: Patient presents seated in chair (assisted by nursing staff)    Transfers Overall transfer level: Needs assistance Equipment used: Rolling walker (2 wheeled) Transfers: Sit to/from [employer] Sit to Stand: Mod assist Stand pivot transfers: Mod assist       General transfer comment: requires repeated verbal cueing to step closer to chair before attempting to  sit with poor carryover  Ambulation/Gait Ambulation/Gait assistance: Min assist;Mod assist Gait Distance (Feet): 70 Feet Assistive device: Rolling walker (2 wheeled) Gait Pattern/deviations: Decreased step length - right;Decreased step length - left;Decreased stride length;Scissoring Gait velocity: decreased   General Gait Details: slow labored cadence requiring tactile cueing to turn RW when making turns, has tendency to scissor legs when turning   Stairs             Wheelchair Mobility    Modified Rankin (Stroke Patients Only)       Balance Overall balance assessment: Needs assistance Sitting-balance support: Feet unsupported;No upper extremity supported Sitting balance-Leahy Scale: Good Sitting balance - Comments: seated in chair and at bedside   Standing balance support: During functional activity;Bilateral upper extremity supported Standing balance-Leahy Scale: Fair Standing balance comment: using RW                            Cognition Arousal/Alertness: Awake/alert Behavior During Therapy: WFL for tasks assessed/performed Overall Cognitive Status: History of cognitive impairments - at baseline                                        Exercises      General Comments        Pertinent Vitals/Pain Pain Assessment: No/denies pain    Home Living  Prior Function            PT Goals (current goals can now be found in the care plan section) Acute Rehab PT Goals Patient Stated Goal: not stated PT Goal Formulation: With patient Time For Goal Achievement: 06/30/20 Potential to Achieve Goals: Good Progress towards PT goals: Progressing toward goals    Frequency    Min 2X/week      PT Plan Current plan remains appropriate    Co-evaluation              AM-PAC PT "6 Clicks" Mobility   Outcome Measure  Help needed turning from your back to your side while in a flat bed without using  bedrails?: A Lot Help needed moving from lying on your back to sitting on the side of a flat bed without using bedrails?: A Lot Help needed moving to and from a bed to a chair (including a wheelchair)?: A Lot Help needed standing up from a chair using your arms (e.g., wheelchair or bedside chair)?: A Little Help needed to walk in hospital room?: A Lot Help needed climbing 3-5 steps with a railing? : A Lot 6 Click Score: 13    End of Session   Activity Tolerance: Patient tolerated treatment well;Patient limited by fatigue Patient left: in chair;with call bell/phone within reach;with chair alarm set Nurse Communication: Mobility status PT Visit Diagnosis: Unsteadiness on feet (R26.81);Other abnormalities of gait and mobility (R26.89);Muscle weakness (generalized) (M62.81)     Time: 3086-5784 PT Time Calculation (min) (ACUTE ONLY): 23 min  Charges:  $Therapeutic Activity: 23-37 mins                     12:35 PM, 06/22/20 Ocie Bob, MPT Physical Therapist with [facility] [phone] office [phone] mobile phone

## 2020-06-22 NOTE — ED Notes (Signed)
Pt assisted back to bed. Pericare done. Call bell within reach and bed alarm on.

## 2020-06-22 NOTE — ED Notes (Signed)
Assisted to recliner, bed bathed, and new condom cath placed. Pt able to brush teeth himself with assistance. Call bell placed at bedside, pt shown which button to hit for help and pt demonstrated. Chair alarm on at this time.

## 2020-06-22 NOTE — TOC Progression Note (Signed)
Transition of Care [facility]) - Progression Note    Patient Details  Name: Isaac [name] MRN: [id number] Date of Birth: 21-Feb-1981  Transition of Care [facility]) CM/SW Contact  Barry Brunner, LCSW Phone Number: 06/22/2020, 10:35 AM  Clinical Narrative:    CSW followed up with Revonda Standard from [facility] concerning patient referral. CSW notified Revonda Standard that patient has been given regular medicaid. Revonda Standard inquired about if patient received disability. CSW confirmed with Judeth Cornfield that patient does receive disability. CSW informed Revonda Standard with [employer]. Revonda Standard reported that she is resubmitting patient for review. Stephanie with [facility] DSS also reported that a petition for custody has been filed and the hearing is tomorrow on 06/22/2020. TOC to follow.         Expected Discharge Plan and Services                                                 Social Determinants of Health (SDOH) Interventions    Readmission Risk Interventions Readmission Risk Prevention Plan 02/23/2020  PCP or Specialist Appt within 3-5 Days Complete

## 2020-06-23 NOTE — ED Notes (Signed)
Pt appears to be asleep with eyes closed with even respirations. The bed is locked and in the lowest position. The bed alarm is active and the call bed is within reach.

## 2020-06-23 NOTE — ED Notes (Signed)
Sacrum protector and bilateral heel protectors placed on pt to prevent breakdown. Heel boots also placed on pt.

## 2020-06-23 NOTE — Progress Notes (Signed)
OT Cancellation Note  Patient Details Name: Isaac [name] MRN: [id number] DOB: August 15, 1980   Cancelled Treatment:    Reason Eval/Treat Not Completed: Patient declined, no reason specified. Pt was prompted to sit at EOB and was given ~5 to 8 minutes to respond to the command. Pt appeared agitated and did not follow instructions with the only verbal expression of "what the F*#ck." Pt does not seem appropriate for OT at this time. Referral OT evaluation discussed with social work team. Will attempt to see pt at a later date if not discharged from OT evaluation list.   Danie Chandler OT, MOT   Danie Chandler 06/23/2020, 9:53 AM

## 2020-06-23 NOTE — ED Provider Notes (Signed)
Emergency Medicine Observation Re-evaluation Note  Isaac [name] is a 40 y.o. male, seen on rounds today.  Pt initially presented to the ED for complaints of Fever Currently, the patient is awaiting custody and then will require social work placement.  Physical Exam  BP 125/79 (BP Location: Left Arm)   Pulse 73   Temp 97.7 F (36.5 C) (Oral)   Resp 14   Ht 1.88 m (6\' 2" )   Wt 107.5 kg   SpO2 98%   BMI 30.43 kg/m  Physical Exam General: Sleeping Cardiac:  Lungs: No respiratory distress Psych: Sleep  ED Course / MDM  EKG:   I have reviewed the labs performed to date as well as medications administered while in observation.  Recent changes in the last 24 hours include social worker working on custody.  See note below.  CSW followed up with from [facility] concerning patient referral. CSW notified [facility] that patient has been given regular medicaid. Revonda Standard inquired about if patient received disability. CSW confirmed with Revonda Standard that patient does receive disability. CSW informed Judeth Cornfield with [employer]. Revonda Standard reported that she is resubmitting patient for review. Stephanie with [facility] DSS also reported that a petition for custody has been filed and the hearing is tomorrow on 06/22/2020. TOC to follow    Plan  Current plan is for social work and working on placement.  Currently working on custody assignment. Patient is not under full IVC at this time.   06/24/2020, MD 06/23/20 [phone]

## 2020-06-23 NOTE — ED Notes (Signed)
Pt cleaned. New linens and gown provided. Pt repositioned in bed for comfort and lighting dimmed per request. Pt provided with warm blankets. No other requests at this time. Call bell within reach, bed in low position. Will continue to monitor.

## 2020-06-23 NOTE — ED Notes (Signed)
Removed pt's breakfast tray and cleaned table. Changed patient's gown and wiped his face. Patient was pleasant and cooperative.

## 2020-06-23 NOTE — ED Notes (Signed)
PT at bedside.

## 2020-06-24 LAB — BASIC METABOLIC PANEL
Anion gap: 9 (ref 5–15)
BUN: 11 mg/dL (ref 6–20)
CO2: 27 mmol/L (ref 22–32)
Calcium: 8.8 mg/dL — ABNORMAL LOW (ref 8.9–10.3)
Chloride: 102 mmol/L (ref 98–111)
Creatinine, Ser: 0.8 mg/dL (ref 0.61–1.24)
GFR, Estimated: >60 mL/min (ref >60)
Glucose, Bld: 116 mg/dL — ABNORMAL HIGH (ref 70–99)
Potassium: 4.1 mmol/L (ref 3.5–5.1)
Sodium: 138 mmol/L (ref 135–145)

## 2020-06-24 LAB — CBC WITH DIFFERENTIAL/PLATELET
Abs Immature Granulocytes: 0.01 10*3/uL (ref 0.00–0.07)
Basophils Absolute: 0 10*3/uL (ref 0.0–0.1)
Basophils Relative: 1 %
Eosinophils Absolute: 0.1 10*3/uL (ref 0.0–0.5)
Eosinophils Relative: 2 %
HCT: 38.5 % — ABNORMAL LOW (ref 39.0–52.0)
Hemoglobin: 12.2 g/dL — ABNORMAL LOW (ref 13.0–17.0)
Immature Granulocytes: 0 %
Lymphocytes Relative: 38 %
Lymphs Abs: 2.1 10*3/uL (ref 0.7–4.0)
MCH: 30.3 pg (ref 26.0–34.0)
MCHC: 31.7 g/dL (ref 30.0–36.0)
MCV: 95.5 fL (ref 80.0–100.0)
Monocytes Absolute: 0.5 10*3/uL (ref 0.1–1.0)
Monocytes Relative: 9 %
Neutro Abs: 2.8 10*3/uL (ref 1.7–7.7)
Neutrophils Relative %: 50 %
Platelets: 265 10*3/uL (ref 150–400)
RBC: 4.03 MIL/uL — ABNORMAL LOW (ref 4.22–5.81)
RDW: 13.9 % (ref 11.5–15.5)
WBC: 5.5 10*3/uL (ref 4.0–10.5)
nRBC: 0 % (ref 0.0–0.2)

## 2020-06-24 LAB — URINALYSIS, ROUTINE W REFLEX MICROSCOPIC
Bacteria, UA: NONE SEEN
Bilirubin Urine: NEGATIVE
Glucose, UA: NEGATIVE mg/dL
Ketones, ur: NEGATIVE mg/dL
Leukocytes,Ua: NEGATIVE
Nitrite: NEGATIVE
Protein, ur: NEGATIVE mg/dL
Specific Gravity, Urine: 1.005 (ref 1.005–1.030)
pH: 7 (ref 5.0–8.0)

## 2020-06-24 NOTE — ED Notes (Signed)
Pt states he was hungry. Pt was given sandwich and chip with sprite.

## 2020-06-24 NOTE — ED Provider Notes (Signed)
Emergency Medicine Observation Re-evaluation Note  Isaac [name] is a 40 y.o. male, seen on rounds today.  Pt initially presented to the ED for complaints of Fever 11:40 AM, 06/24/2020; I was asked by nursing to evaluate the patient who appears to have decompensated this morning.  On review of chart, patient was admitted to the ED on 06/11/2019.  He was evaluated for fever, and diagnosed with cystitis.  Initially it was contemplated to discharge him however he did not have a safe home to return to.  Social work was involved at that point, and he has remained a border, pending disposition.  Relative to discharge status, apparently work is being done for guardianship as well as placement.  The patient is debilitated because of a prior stroke.  He also has a history of bacterial meningitis.  He has been difficult to manage in the past because of combative behavior.  He has completed treatment for UTI, during this stay for a prolonged time in the emergency department.  Last urine culture 06/09/2020, positive for Klebsiella.  No other recent laboratory testing.  Antibiotic treatment for UTI completed 06/16/2020.  Vital signs, this morning, normal except pulse elevated at 105.  Currently, the patient is resting in bed.  He states "I am all right."  He is unable to give additional history.  Abdomen is soft and nontender.  His skin is warm to touch.  Oral mucous members appear moist.  Physical Exam  BP 125/82 (BP Location: Left Arm)   Pulse (!) 105   Temp 98.1 F (36.7 C) (Oral)   Resp 20   Ht _0  (1.88 m)   Wt 107.5 kg   SpO2 100%   BMI 30.43 kg/m  Physical Exam General: Nontoxic appearing Cardiac: Tachycardic Lungs: No respiratory distress Psych: Calm  ED Course / MDM  EKG:   I have reviewed the labs performed to date as well as medications administered while in observation.  Recent changes in the last 24 hours include heart rate elevated.  See notes above.  Plan  Current plan is for evaluate for  UTI and other types of decompensation including metabolic. Patient is not under full IVC at this time.  He has been medically cleared previously but now has elevated heart rate.  Will evaluate and treat as necessary.  2:00 PM-evaluation for decompensation complete.  Be met, CBC, and urinalysis are all reassuring.  Catheter sample UA does show some blood however no other signs for UTI.  At this point we will continue to observe, follow vital signs expectantly and proceed with placement   Daleen Bo, MD 06/24/20 1401

## 2020-06-24 NOTE — ED Notes (Signed)
In to place pt's heel protectors on for the night. Pt found to have removed his male pure wick device and had urinated on bed. Bedding changed. New pure-wick applied. Warm blankets given. Bed placed back in lowest position. Will continue to monitor.

## 2020-06-24 NOTE — ED Notes (Signed)
Pt placed back in bed from chair. Bed alarm in place.

## 2020-06-24 NOTE — ED Notes (Signed)
Patient found saturated in urine and feces upon rounding from [employer]. Suction canister was full past the max capacity. Patient bathed and complete bed linen changed.

## 2020-06-24 NOTE — TOC Progression Note (Signed)
Transition of Care [facility]) - Progression Note    Patient Details  Name: Isaac [name] MRN: [id number] Date of Birth: 04/20/1980  [facility] CM/SW Contact  Marina Goodell Phone Number: [phone] 06/24/2020, 1:55 PM  Clinical Narrative:     CSW contacted Revonda Standard from [facility] for update on bed offer. Revonda Standard stated she would update me as soon as there was an answer.        Expected Discharge Plan and Services                                                 Social Determinants of Health (SDOH) Interventions    Readmission Risk Interventions Readmission Risk Prevention Plan 02/23/2020  PCP or Specialist Appt within 3-5 Days Complete

## 2020-06-24 NOTE — ED Notes (Signed)
Pt non cooperative at this time, combative. Pt refusing to get out of bed. Pt cleaned and linens changed. EDP notified pt HR 105 and not as cooperative as he normally is. EDP to see pt.

## 2020-06-25 NOTE — ED Notes (Signed)
Pt had removed Pure-wick again. Replaced with new one and changed linen. Pt tolerated well.

## 2020-06-25 NOTE — ED Notes (Signed)
In room to check on patient. Chooses to remain up to chair at this time. Sprite provided. Patient laughing and responds to questions when asked. Male Purwick in in place and draining at this time. Incontinent brief checked and dry.

## 2020-06-25 NOTE — TOC Progression Note (Addendum)
CSW spoke to Isaac [name] with [employer] there is still no update, she will provide update when she has one. Pt currently has no bed offers. TOC to follow.

## 2020-06-25 NOTE — ED Provider Notes (Signed)
Pt waiting on placement.  He has been stable through the night.     Jacalyn Lefevre, MD 06/25/20 1224

## 2020-06-25 NOTE — ED Notes (Signed)
Pt cleaned up and new brief and gown put on due to incontinence of stool and urine. Pt helped into bed.

## 2020-06-25 NOTE — Progress Notes (Signed)
Physical Therapy Treatment Patient Details Name: Isaac [name] MRN: [id number] DOB: 1980-10-13 Today's Date: 06/25/2020    History of Present Illness Isaac [name] is a 40 y.o. male.  He has a history of organic brain syndrome.  5 caveat.  He is brought in by EMS for fever and change in mental status.  Apparently he has been more combative at home, lives with mother.  EMS reports a temp of 100.6 although afebrile here.  Patient endorses cough, abdominal pain, dysuria.  He is not able to elaborate on any of those.  Unknown of COVID v    PT Comments    Patient demonstrates good return for sitting up at bedside with Min guard assist, slow labored movement for sit to stands and requires repeated verbal/tactile for transferring to chair, increased endurance/distance for ambulation without loss of balance and limited mostly due to fatigue.  Patient tolerated sitting up in chair after therapy - nursing staff aware.  Patient will benefit from continued physical therapy in hospital and recommended venue below to increase strength, balance, endurance for safe ADLs and gait.   Follow Up Recommendations  SNF;Supervision for mobility/OOB;Supervision/Assistance - 24 hour     Equipment Recommendations  None recommended by PT    Recommendations for Other Services       Precautions / Restrictions Precautions Precautions: Fall Restrictions Weight Bearing Restrictions: No    Mobility  Bed Mobility Overal bed mobility: Needs Assistance Bed Mobility: Supine to Sit     Supine to sit: Min guard     General bed mobility comments: increased time, labored movement    Transfers Overall transfer level: Needs assistance Equipment used: Rolling walker (2 wheeled) Transfers: Sit to/from [employer] Sit to Stand: Min assist Stand pivot transfers: Mod assist       General transfer comment: requires repeated verbal cueing to step closer to chair before attempting to sit with poor  carryover  Ambulation/Gait   Gait Distance (Feet): 120 Feet Assistive device: Rolling walker (2 wheeled) Gait Pattern/deviations: Decreased step length - right;Decreased step length - left;Decreased stride length;Scissoring Gait velocity: decreased   General Gait Details: slightly labored slow cadence without loss of balance, requires verbal/tactile cueing to turn RW   Stairs             Wheelchair Mobility    Modified Rankin (Stroke Patients Only)       Balance Overall balance assessment: Needs assistance Sitting-balance support: Feet unsupported;No upper extremity supported Sitting balance-Leahy Scale: Good Sitting balance - Comments: seated at EOB   Standing balance support: During functional activity;Bilateral upper extremity supported Standing balance-Leahy Scale: Fair Standing balance comment: using RW                            Cognition Arousal/Alertness: Awake/alert Behavior During Therapy: WFL for tasks assessed/performed Overall Cognitive Status: History of cognitive impairments - at baseline                                        Exercises      General Comments        Pertinent Vitals/Pain Pain Assessment: No/denies pain    Home Living                      Prior Function  PT Goals (current goals can now be found in the care plan section) Acute Rehab PT Goals Patient Stated Goal: not stated PT Goal Formulation: With patient Time For Goal Achievement: 06/30/20 Potential to Achieve Goals: Good Progress towards PT goals: Progressing toward goals    Frequency    Min 2X/week      PT Plan Current plan remains appropriate    Co-evaluation              AM-PAC PT "6 Clicks" Mobility   Outcome Measure  Help needed turning from your back to your side while in a flat bed without using bedrails?: A Little Help needed moving from lying on your back to sitting on the side of a flat bed  without using bedrails?: A Little Help needed moving to and from a bed to a chair (including a wheelchair)?: A Little Help needed standing up from a chair using your arms (e.g., wheelchair or bedside chair)?: A Little Help needed to walk in hospital room?: A Lot Help needed climbing 3-5 steps with a railing? : A Lot 6 Click Score: 16    End of Session   Activity Tolerance: Patient tolerated treatment well;Patient limited by fatigue Patient left: in chair;with call bell/phone within reach;with chair alarm set Nurse Communication: Mobility status PT Visit Diagnosis: Unsteadiness on feet (R26.81);Other abnormalities of gait and mobility (R26.89);Muscle weakness (generalized) (M62.81)     Time: 2330-0762 PT Time Calculation (min) (ACUTE ONLY): 32 min  Charges:  $Therapeutic Activity: 23-37 mins                     3:42 PM, 06/25/20 Ocie Bob, MPT Physical Therapist with [facility] 336 [phone] office [phone] mobile phone

## 2020-06-25 NOTE — ED Notes (Signed)
Pt sitting in chair watching TV 

## 2020-06-25 NOTE — ED Notes (Signed)
Patient OOB with PT walking in hallway. Patient cooperative with staff. PT placed patient in chair where he is currently watching TV.

## 2020-06-26 MED ORDER — QUETIAPINE FUMARATE 100 MG PO TABS
300.0000 mg | ORAL_TABLET | Freq: Once | ORAL | Status: AC
  Administered 2020-06-26: 300 mg via ORAL

## 2020-06-26 NOTE — ED Notes (Signed)
Checked pt brief to assess if wet- pt brief dry-purewick in place- vitals taken- pt back to sleep

## 2020-06-26 NOTE — ED Notes (Signed)
Peri-care done and external catheter changed.  Pt turned on left side.  Pt is not wanting to get OOB.

## 2020-06-26 NOTE — ED Notes (Signed)
Pt is turned onto back and asleep.

## 2020-06-26 NOTE — ED Notes (Signed)
Turned to right side.  Side rails up times 4, bed in low position, bed and pad alarms in place.  Door with curtain open.

## 2020-06-26 NOTE — ED Notes (Signed)
Pt resting in chair, reclined for comfort, is not ready to return to bed at this time, suction canister changed, profit male intact, lemon lime soda and ice provided to pt, VS updated and stable. Pt denies further needs at this time. Chair alarm in place, call bell and bedside table in reach. Will continue to monitor.

## 2020-06-26 NOTE — ED Notes (Signed)
Awake.  Pt on back watching television.

## 2020-06-27 NOTE — ED Notes (Signed)
Pt resting upright in chair, VS updated and stable, pt denied wanting to move to bed at this time, denies further needs. Profit male intact. Call bell within reach. NAD noted.

## 2020-06-27 NOTE — ED Notes (Addendum)
Pt pulled off external catheter. Bed linens and pt wet with urine. Pt bathed with pericare completed, linens changed external catheter and adult diaper applied.

## 2020-06-28 NOTE — ED Notes (Signed)
Patient alert, feeding self at this time. Took PO meds without difficulty. No s/s acute distress at this time.

## 2020-06-28 NOTE — ED Notes (Signed)
Incontinent care provided, purwick in place. Patient assisted to chair by two staff members.

## 2020-06-28 NOTE — ED Notes (Signed)
Patient's incontinent brief checked and noted to be dry. Purwick in place. Mother at bedside visiting with patient and providing him snacks and drinks. Patient continues to wish to remain up to chair.

## 2020-06-28 NOTE — TOC Progression Note (Addendum)
CSW spoke to [place] with DSS, she states that DSS has become pts interim guardian. She informed CSW that the facility pt goes to will need a standard plan care level of approval from [facility]. Also, pts Medicaid direct is effective starting May 1st.   CSW calling facilities to let them know of pts Medicaid change and see if there can be any bed offers made.   CSW spoke with admission at the [facility] in [place], they are unable to accept pt due to his age.  CSW spoke with Eunice Blase at [facility] who states she can look over pts referral a second time as pt now has traditional medicaid. However, she is out of the office and will reach out to CSW in the morning.   CSW reached out to [facility], admissions is in there morning meeting, CSW to f/u after 11.   CSW left VM for admissions director at [facility] for call back.   CSW spoke to Tammy with [facility]. They currently have no long term beds available.   Per Revonda Standard in admissions with [facility], the business office manage will return to the office 4/26, CSW will reach out for update when she returns and can look over referral.  1:30pm: CSW spoke to Eleva in admissions with [facility] who states they are able to accept [facility] from what she is aware of. She is going to review pts referral. CSW is working on [employer] for pt. TOC to follow.

## 2020-06-28 NOTE — ED Notes (Signed)
Also gave pt a drink and graham crackers

## 2020-06-28 NOTE — ED Notes (Signed)
Pt alert, calm and cooperative at this time. This RN offered to transfer pt back into bed, pt requests to stay in recliner. Pt took PO medications without any difficulty. Pt given drink per request.

## 2020-06-29 NOTE — Progress Notes (Signed)
Physical Therapy Treatment Patient Details Name: Isaac [name] MRN: [id number] DOB: 07/07/1980 Today's Date: 06/29/2020    History of Present Illness Crue Otero is a 40 y.o. male.  He has a history of organic brain syndrome.  5 caveat.  He is brought in by EMS for fever and change in mental status.  Apparently he has been more combative at home, lives with mother.  EMS reports a temp of 100.6 although afebrile here.  Patient endorses cough, abdominal pain, dysuria.  He is not able to elaborate on any of those.  Unknown of COVID v    PT Comments    Patient presents up in chair (assisted by nursing staff) and agreeable for therapy.  Patient requires repeated verbal/tactile cueing for proper hand placement with poor carryover requiring Mod assist to stand, once on feet demonstrates good tolerance for maintaining standing balance leaning on RW, able to ambulate in hallway without loss of balance with improvement for making turns using RW, limited mostly due to fatigue and tolerated staying up in chair after therapy - RN aware.  Patient will benefit from continued physical therapy in hospital and recommended venue below to increase strength, balance, endurance for safe ADLs and gait.    Follow Up Recommendations  SNF;Supervision for mobility/OOB;Supervision/Assistance - 24 hour     Equipment Recommendations  None recommended by PT    Recommendations for Other Services       Precautions / Restrictions Precautions Precautions: Fall Restrictions Weight Bearing Restrictions: No    Mobility  Bed Mobility               General bed mobility comments: Patieint presents up in chair (assisted by nurisng staff)    Transfers Overall transfer level: Needs assistance Equipment used: Rolling walker (2 wheeled) Transfers: Sit to/from [employer] Sit to Stand: Mod assist Stand pivot transfers: Mod assist       General transfer comment: requires repeated verbal cueing to  step closer to chair before attempting to sit with poor carryover  Ambulation/Gait Ambulation/Gait assistance: Min assist;Mod assist Gait Distance (Feet): 140 Feet Assistive device: Rolling walker (2 wheeled) Gait Pattern/deviations: Decreased step length - right;Decreased step length - left;Decreased stride length;Scissoring Gait velocity: decreased   General Gait Details: slightly labored slow cadence without loss of balance, requires verbal/tactile cueing to turn RW   Stairs             Wheelchair Mobility    Modified Rankin (Stroke Patients Only)       Balance Overall balance assessment: Needs assistance Sitting-balance support: Feet unsupported;No upper extremity supported Sitting balance-Leahy Scale: Good Sitting balance - Comments: seated in edge of chair   Standing balance support: During functional activity;Bilateral upper extremity supported Standing balance-Leahy Scale: Fair Standing balance comment: using RW                            Cognition Arousal/Alertness: Awake/alert Behavior During Therapy: WFL for tasks assessed/performed Overall Cognitive Status: History of cognitive impairments - at baseline                                        Exercises      General Comments        Pertinent Vitals/Pain Pain Assessment: No/denies pain    Home Living  Prior Function            PT Goals (current goals can now be found in the care plan section) Acute Rehab PT Goals Patient Stated Goal: not stated PT Goal Formulation: With patient Time For Goal Achievement: 07/06/20 Potential to Achieve Goals: Good Progress towards PT goals: Progressing toward goals    Frequency    Min 2X/week      PT Plan Current plan remains appropriate    Co-evaluation              AM-PAC PT "6 Clicks" Mobility   Outcome Measure  Help needed turning from your back to your side while in a flat bed  without using bedrails?: A Little Help needed moving from lying on your back to sitting on the side of a flat bed without using bedrails?: A Little Help needed moving to and from a bed to a chair (including a wheelchair)?: A Little Help needed standing up from a chair using your arms (e.g., wheelchair or bedside chair)?: A Lot Help needed to walk in hospital room?: A Lot Help needed climbing 3-5 steps with a railing? : A Lot 6 Click Score: 15    End of Session   Activity Tolerance: Patient tolerated treatment well;Patient limited by fatigue Patient left: in chair;with call bell/phone within reach;with chair alarm set Nurse Communication: Mobility status PT Visit Diagnosis: Unsteadiness on feet (R26.81);Other abnormalities of gait and mobility (R26.89);Muscle weakness (generalized) (M62.81)     Time: 5400-8676 PT Time Calculation (min) (ACUTE ONLY): 20 min  Charges:  $Therapeutic Activity: 8-22 mins                     1:53 PM, 06/29/20 Ocie Bob, MPT Physical Therapist with [facility] 336 [phone] office [phone] mobile phone

## 2020-06-29 NOTE — ED Notes (Signed)
Pt cleaned. New brief and male-wick applied. Pt repositioned in bed for comfort and provided with warm blankets. He denied any other needs at this time and appears to be in NAD. Lighting dimmed per request. Will continue to monitor.

## 2020-06-29 NOTE — TOC Progression Note (Addendum)
CSW awaiting PASRR approval for pt. Once PASRR approved CSW will reach out to admissions at [facility] for f/u. TOC to follow.   PASRR will do assessment with pt after 10 tomorrow.

## 2020-06-29 NOTE — ED Notes (Signed)
Assisted pt up to chair. Linens changed and bed bath given. Pleasant and cooperative with care

## 2020-06-29 NOTE — ED Notes (Signed)
Pt wanted to get up out of the chair, into the bed. Pt begin to start walking out of the room and towards nurses station. Pt directed back to room and started to get aggravated. Pt is laying in bed now with his head on the feet end and did not let us help to move him.

## 2020-06-30 NOTE — ED Notes (Signed)
Patient denies pain and is resting comfortably.  

## 2020-06-30 NOTE — ED Provider Notes (Signed)
  Physical Exam  BP (!) 101/45 (BP Location: Left Arm)   Pulse 96   Temp 98.2 F (36.8 C) (Oral)   Resp 16   Ht 6\' 2"  (1.88 m)   Wt 107.5 kg   SpO2 97%   BMI 30.43 kg/m   Physical Exam  ED Course/Procedures   Clinical Course as of 06/30/20 0735  Wed Jun 09, 2020  1728 Chest x-ray interpreted by me as poor inspiration, no acute infiltrates. [MB]  1756 Radiology: Hazy opacity left base which may be infectious [MB]  2106 The charge nurse called the patient's mother who is his guardian to ask that she come pick him up.  She is refusing to pick him up and says she cannot care for him any longer.  He will be a border here and get a social work consult in the morning. [MB]    Clinical Course User Index [MB] 2107, MD    Procedures  MDM  Patient still pending placement.  Reviewing notes now has traditional Medicaid and potentially has some more placement options.  Has been fine overnight.  Sleeping comfortably at this time.  Vitals reassuring.  Has been in the ER for nearly 500 hours.      Terrilee Files, MD 06/30/20 [phone]

## 2020-06-30 NOTE — TOC Progression Note (Signed)
CSW spoke with Isaac [name] with PASRR about pt. At her request she was provided with pts mothers name and phone number at 9:23am. [name] informed CSW she would f/u at some time today. CSW still awaiting follow up call to complete PASRR assessment. TOC to follow.

## 2020-06-30 NOTE — ED Notes (Signed)
Denies pain, resting comfortably.  

## 2020-07-01 NOTE — ED Notes (Signed)
I assumed care of patient at this time. Pt currently sitting in bedside chair and appears to be in NAD. Call bell within reach. Will continue to monitor.

## 2020-07-01 NOTE — ED Notes (Signed)
Pt received a shower and fresh linens. Pt is eating dinner at this time.

## 2020-07-01 NOTE — ED Notes (Addendum)
Pt sitting in recliner eating breakfast at this time.

## 2020-07-01 NOTE — ED Notes (Signed)
Pt back to bed, pericare, new purewick and brief placed. Pt comfortable and resting at this time.

## 2020-07-01 NOTE — ED Notes (Signed)
Rounded on pt. Pt still sitting comfortably in bedside chair and appears to be in NAD. Pt denied any needs at this time and stated that he would let me know when he's ready to get back into his bed. Will continue to monitor.

## 2020-07-01 NOTE — ED Notes (Signed)
Pt given lunch tray.

## 2020-07-01 NOTE — ED Notes (Signed)
No behaviors noted this shift.

## 2020-07-01 NOTE — ED Notes (Signed)
Pt assisted into bed from bedside chair. Pt cleaned and repositioned in bed for comfort. Pt provided with warm blankets and denied any other needs at this time. Call bell within reach, bed in low position. Will continue to monitor.

## 2020-07-01 NOTE — TOC Progression Note (Signed)
Pts PASRR is [id number] H. CSW provided [facility] with pts PASRR. Admissions will look over referral and get back to CSW tomorrow as to if they can make a bed offer for pt. TOC to follow.

## 2020-07-02 NOTE — ED Provider Notes (Signed)
Emergency Medicine Observation Re-evaluation Note  Isaac [name] is a 40 y.o. male, seen on rounds today.  Pt initially presented to the ED for complaints of Fever Currently, the patient is resting comfortably.  Physical Exam  BP 125/69 (BP Location: Left Arm)   Pulse 66   Temp 98.1 F (36.7 C) (Oral)   Resp 18   Ht 6\' 2"  (1.88 m)   Wt 107.5 kg   SpO2 100%   BMI 30.43 kg/m  Physical Exam General: No acute distress Cardiac: Well-perfused Lungs: Nonlabored Psych: Cooperative  ED Course / MDM  EKG:   I have reviewed the labs performed to date as well as medications administered while in observation.  Recent changes in the last 24 hours include [facility]SA still looking for placement options.  Plan  Current plan is for [facility] for placement.   [facility], MD 07/02/20 [phone]

## 2020-07-02 NOTE — TOC Progression Note (Addendum)
CSW made call to [place] with [facility] for update on if they can make a bed offer on pt. She states she will call CSW with update. TOC to follow. CSW attempted call back to [place] and got no answer, CSW left VM for call back.   CSW called the below facilities to inquire about bed availability.   [facility]: no MTC beds.  [facility]: Left VM for admissions, no call back.   [facility]: Left VM, no call back.   [facility]: No LTC medicaid beds.   [facility]: Left VM, no call back.   [facility]: No in network with insurance.   [facility]: Admissions will review pt with their team. Concerned for his age. Team will continue reviewing it. No long term beds at this time.   TOC to continue following.

## 2020-07-02 NOTE — ED Notes (Signed)
Pt ate 100% of meal tray 

## 2020-07-02 NOTE — ED Notes (Signed)
Pt ate 100% of his lunch with no difficulties. Pt sitting comfortably in bed with sister at his side. NAD noted.

## 2020-07-02 NOTE — ED Notes (Signed)
Pt ate 100% of his breakfast. Pt smiling and interacting with nursing staff appropriately this morning. Toileting offered. Pt on male purewick. Canister empty at this time.

## 2020-07-03 MED ORDER — OXCARBAZEPINE 300 MG PO TABS
ORAL_TABLET | ORAL | Status: AC
  Filled 2020-07-03: qty 1

## 2020-07-03 NOTE — ED Notes (Signed)
Pt ate 100% food family at bedside

## 2020-07-03 NOTE — ED Notes (Signed)
Pt up to chair eating breakfast.

## 2020-07-03 NOTE — ED Notes (Signed)
Pt linen changed, pt cleansed, teeth brushed, purwick changed, new diaper applied.  Pt back In bed per his request.

## 2020-07-03 NOTE — ED Notes (Signed)
Pt resting at this time.

## 2020-07-03 NOTE — ED Notes (Signed)
Pt ate 100% of breakfast.

## 2020-07-03 NOTE — ED Notes (Signed)
Patient resting at this time. Brief checked and dry. Male purewick intact. Night meds given without issue. Snacks provided.

## 2020-07-04 NOTE — ED Notes (Signed)
Pt bed linens wet. Pt changed and given bed bath. Pt then ambulated around the nurses station back to his bed side recliner. Pt sitting up in bed at this time eating breakfast and watching TV.

## 2020-07-04 NOTE — ED Provider Notes (Signed)
Emergency Medicine Observation Re-evaluation Note  Isaac [name] is a 40 y.o. male, seen on rounds today.  Pt initially presented to the ED for complaints of Fever Currently, the patient is eating lunch.  Physical Exam  BP 109/70   Pulse 75   Temp 98.5 F (36.9 C) (Oral)   Resp 17   Ht 6\' 2"  (1.88 m)   Wt 107.5 kg   SpO2 100%   BMI 30.43 kg/m  Physical Exam General: No acute distress Cardiac: Well-perfused Lungs: Nonlabored Psych: Calm and cooperative  ED Course / MDM  EKG:   I have reviewed the labs performed to date as well as medications administered while in observation.  Recent changes in the last 24 hours include TOC continuing to look for placement.  Plan  Current plan is for placement options.    , MD 07/04/20 [phone]

## 2020-07-04 NOTE — ED Provider Notes (Signed)
Emergency Medicine Observation Re-evaluation Note  Isaac [name] is a 40 y.o. male, seen on rounds today.  Pt initially presented to the ED for complaints of Fever Currently, the patient is sleeping.  Physical Exam  BP 122/68 (BP Location: Left Arm)   Pulse 66   Temp 98.5 F (36.9 C) (Oral)   Resp 18   Ht 6\' 2"  (1.88 m)   Wt 107.5 kg   SpO2 100%   BMI 30.43 kg/m  Physical Exam General: No acute distress Cardiac: Well-perfused Lungs: Nonlabored Psych: Calm and cooperative  ED Course / MDM  EKG:   I have reviewed the labs performed to date as well as medications administered while in observation.  Recent changes in the last 24 hours include TOC continues to look for placement.  Plan  Current plan is for continuing to look for placement options.    , MD 07/04/20 [phone]

## 2020-07-04 NOTE — ED Notes (Signed)
Pt soiled bed. Pt cleaned and changed. Bed wiped down. Pt now in bed side recliner eating dinner tray.

## 2020-07-04 NOTE — ED Notes (Signed)
Pt placed in dry brief. Lights turned down loan and pt now sleeping. Will continue to monitor

## 2020-07-05 NOTE — ED Notes (Signed)
Family at bedside (Mother)

## 2020-07-05 NOTE — ED Notes (Signed)
Patient resting in bed with eyes closed. Respirations even and unlabored. Report received from reece, RN. NAD noted.

## 2020-07-05 NOTE — ED Notes (Signed)
Patient cleansed and provided with perineum care due to bowel movement. Male catheter in place and new brief provided. Patient sat up in bedside chair with walker and states he wanted a sprite. Patient appears to become more agitated today.

## 2020-07-05 NOTE — NC FL2 (Signed)
[place] MEDICAID FL2 LEVEL OF CARE SCREENING TOOL     IDENTIFICATION  Patient Name: Isaac [name] Birthdate: 09/25/1980 Sex: male Admission Date (Current Location): 06/09/2020  [place] and [place] Number:  [name] [id number] J Facility and Address:  [facility],  [address], [place] [address]      Provider Number: [id number]  Attending Physician Name and Address:  Default, Provider, MD  Relative Name and Phone Number:  Smith,JoAnn Mother     [phone]    Current Level of Care: Hospital Recommended Level of Care: Skilled Nursing Facility,Nursing Facility (Long Term Care (LTC)) Prior Approval Number:    Date Approved/Denied:   PASRR Number: [id number]  Discharge Plan: SNF    Current Diagnoses: Patient Active Problem List   Diagnosis Date Noted  . 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 Cocaine use disorder (HCC) 02/07/2020  . SVT (supraventricular tachycardia) (HCC) 01/11/2020  . Acute cystitis with hematuria   . Agitation   . Acute urinary tract infection 01/01/2020  . History of bacterial meningitis 01/01/2020  . Abnormal ECG 01/01/2020  . Normocytic anemia 01/01/2020  . Combative behavior 12/31/2019  . Gastrostomy tube in place (HCC) 12/31/2019  . Bacterial meningitis 10/04/2019  . NSTEMI (non-ST elevated myocardial infarction) (HCC) 09/28/2019  . Demand ischemia (HCC) 09/28/2019  . Hypokalemia 09/28/2019    Orientation RESPIRATION BLADDER Height & Weight     Self  Normal Incontinent,External catheter Weight: 236 lb 15.9 oz (107.5 kg) Height:  6\' 2"  (188 cm)  BEHAVIORAL SYMPTOMS/MOOD NEUROLOGICAL BOWEL NUTRITION STATUS      Continent Diet  AMBULATORY STATUS COMMUNICATION OF NEEDS Skin   Total Care Verbally (Limitted Verbal communication) Normal                       Personal Care Assistance Level of Assistance  Bathing,Feeding,Dressing,Total care Bathing Assistance: Maximum  assistance Feeding assistance: Maximum assistance Dressing Assistance: Maximum assistance Total Care Assistance: Maximum assistance   Functional Limitations Info  Sight,Hearing,Speech Sight Info: Adequate Hearing Info: Adequate Speech Info: Impaired    SPECIAL CARE FACTORS FREQUENCY                       Contractures Contractures Info: Not present    Additional Factors Info  Allergies   Allergies Info: Lactose intololerance (gi) and pork-derived products           Current Medications (07/05/2020):  This is the current hospital active medication list Current Facility-Administered Medications  Medication Dose Route Frequency Provider Last Rate Last Admin  . diazepam (VALIUM) tablet 5 mg  5 mg Oral Q12H 09/04/2020, MD   5 mg at 07/04/20 2244  . ferrous sulfate tablet 325 mg  325 mg Oral Q breakfast 09/03/20, MD   325 mg at 07/04/20 1141  . folic acid (FOLVITE) tablet 1 mg  1 mg Oral Daily 09/03/20, MD   1 mg at 07/04/20 1142  . lamoTRIgine (LAMICTAL) tablet 100 mg  100 mg Oral Daily 09/03/20, MD   100 mg at 07/04/20 1140  . metoprolol tartrate (LOPRESSOR) tablet 37.5 mg  37.5 mg Oral BID 09/03/20, MD   37.5 mg at 07/04/20 2244  . OXcarbazepine (TRILEPTAL) tablet 150 mg  150 mg Oral BID 09/03/20, MD   150 mg at 07/04/20 2244  . QUEtiapine (SEROQUEL) tablet 300 mg  300 mg Oral q AM  Terrilee Files, MD   300 mg at 07/04/20 1140  . QUEtiapine (SEROQUEL) tablet 400 mg  400 mg Oral QHS Terrilee Files, MD   400 mg at 07/04/20 2245  . senna-docusate (Senokot-S) tablet 1 tablet  1 tablet Oral QHS Derwood Kaplan, MD   1 tablet at 07/04/20 2244  . thiamine tablet 100 mg  100 mg Oral Daily Terrilee Files, MD   100 mg at 07/04/20 1140   Current Outpatient Medications  Medication Sig Dispense Refill  . diazepam (VALIUM) 5 MG tablet Take 1 tablet (5 mg total) by mouth every 12 (twelve) hours. 60 tablet 0  . ferrous sulfate 325  (65 FE) MG tablet Take 1 tablet (325 mg total) by mouth daily with breakfast. 30 tablet 3  . folic acid (FOLVITE) 1 MG tablet Take 1 tablet (1 mg total) by mouth daily. 30 tablet 1  . lamoTRIgine (LAMICTAL) 100 MG tablet Take 1 tablet (100 mg total) by mouth daily. 30 tablet 1  . metoprolol tartrate 37.5 MG TABS Take 37.5 mg by mouth 2 (two) times daily. 60 tablet 2  . OXcarbazepine (TRILEPTAL) 150 MG tablet Take 1 tablet (150 mg total) by mouth 2 (two) times daily. 60 tablet 1  . polyethylene glycol (MIRALAX / GLYCOLAX) 17 g packet Take 17 g by mouth daily. Hold for diarrhea 30 each 1  . QUEtiapine (SEROQUEL) 300 MG tablet Take 1 tablet (300 mg total) by mouth in the morning. 30 tablet 1  . QUEtiapine (SEROQUEL) 400 MG tablet Take 1 tablet (400 mg total) by mouth at bedtime. 30 tablet 1  . senna-docusate (SENOKOT-S) 8.6-50 MG tablet Take 2 tablets by mouth 2 (two) times daily. Hold for diarrhea 60 tablet 1  . thiamine 100 MG tablet Take 1 tablet (100 mg total) by mouth daily. 30 tablet 1  . feeding supplement (ENSURE ENLIVE / ENSURE PLUS) LIQD Take 237 mLs by mouth 2 (two) times daily between meals. (Patient not taking: No sig reported)       Discharge Medications: Please see discharge summary for a list of discharge medications.  Relevant Imaging Results:  Relevant Lab Results:   Additional Information Pt SSN: [id number]  [place] Herb, LCSWA

## 2020-07-05 NOTE — ED Notes (Signed)
Patient's mother updated on plan of care at this time and multiple SNF where this patient's referral has been sent.

## 2020-07-05 NOTE — ED Notes (Signed)
Patient ate 100% of lunch 

## 2020-07-05 NOTE — ED Notes (Signed)
Pt put into bed for the night. Pt linen, brief changed and warm blankets given. Male purwick changed and canister emptyed. Vitals reassessed and pt denies any needs at this time. Will continue to monitor pt.

## 2020-07-05 NOTE — ED Notes (Signed)
Family at bedside speaking with patient.

## 2020-07-05 NOTE — ED Notes (Addendum)
Pt put from chair into bed. Pt linen and brief changed and pt cleaned up. Pt vitals signs assessed. No others needs expressed at this time. Pt is resting comfortably in bed. Will continue to monitor pt.

## 2020-07-05 NOTE — ED Notes (Signed)
Patient ate 100% of dinner

## 2020-07-05 NOTE — TOC Progression Note (Signed)
CSW completed chart review for pts past drug screens due to pt having cocaine use disorder listed in chart on 02/07/2020. Pt has drug screen on 11/26/2017 that showed positive for cocaine. A drug screen completed 09/28/2019 showed negative for cocaine. Lastly, the drug screen completed 12/28/2019 shows patient was also negative for cocaine.

## 2020-07-05 NOTE — TOC Progression Note (Signed)
Spoke with Revonda Standard with the [facility] who states she will look over pts referral again.   CSW spoke to [place] with [facility] who states that she will send pts referral to corporate for review again now that he has traditional medicaid.   CSW has again left multiple voicemail's for admissions at [facility].   TOC to follow.

## 2020-07-06 NOTE — Progress Notes (Addendum)
Physical Therapy Treatment Patient Details Name: Isaac [name] MRN: [id number] DOB: [birth year] Today's Date: 07/06/2020    History of Present Illness Isaac [name] is a 40 y.o. male.  He has a history of organic brain syndrome.  5 caveat.  He is brought in by EMS for fever and change in mental status.  Apparently he has been more combative at home, lives with mother.  EMS reports a temp of 100.6 although afebrile here.  Patient endorses cough, abdominal pain, dysuria.  He is not able to elaborate on any of those.  Unknown of COVID v    PT Comments    Patient demonstrates slow labored movement for sitting up at bedside requiring Mod assist to move legs and pulling self to sitting, good return for completing sit to stands, tends to drift to the left during ambulation requiring verbal/tactile cueing to correct, once fatigued patient tends to lean on RW and has to slow cadence.  Patient tolerated sitting up in chair after therapy - nursing staff aware.  Patient will benefit from continued physical therapy in hospital and recommended venue below to increase strength, balance, endurance for safe ADLs and gait.  Patient discharged from physical therapy to care of nursing staff for ambulation daily as tolerated for length of stay.  Plan:  Patient to go to long term care facility.       Follow Up Recommendations  SNF;Supervision for mobility/OOB;Supervision/Assistance - 24 hour     Equipment Recommendations  None recommended by PT    Recommendations for Other Services       Precautions / Restrictions Precautions Precautions: Fall Restrictions Weight Bearing Restrictions: No    Mobility  Bed Mobility Overal bed mobility: Needs Assistance Bed Mobility: Supine to Sit     Supine to sit: Mod assist     General bed mobility comments: Patient demonstrates slow labored movement requiring assistance to move BLE    Transfers Overall transfer level: Needs assistance Equipment used: Rolling  walker (2 wheeled) Transfers: Sit to/from [employer] Sit to Stand: Min assist Stand pivot transfers: Mod assist       General transfer comment: Has difficulty reaching for chair during stand to sitting  Ambulation/Gait Ambulation/Gait assistance: Min assist;Mod assist Gait Distance (Feet): 120 Feet Assistive device: Rolling walker (2 wheeled) Gait Pattern/deviations: Decreased step length - right;Decreased step length - left;Decreased stride length;Drifts right/left Gait velocity: decreased   General Gait Details: tends to drift left during ambulation, requires tactile assistance to move RW when making turns, slows cadence once fatigued   Stairs             Wheelchair Mobility    Modified Rankin (Stroke Patients Only)       Balance Overall balance assessment: Needs assistance Sitting-balance support: Feet unsupported;No upper extremity supported Sitting balance-Leahy Scale: Good Sitting balance - Comments: seated at EOB   Standing balance support: During functional activity;Bilateral upper extremity supported Standing balance-Leahy Scale: Fair Standing balance comment: using RW                            Cognition Arousal/Alertness: Awake/alert Behavior During Therapy: WFL for tasks assessed/performed Overall Cognitive Status: History of cognitive impairments - at baseline                                        Exercises  General Comments        Pertinent Vitals/Pain Pain Assessment: No/denies pain    Home Living                      Prior Function            PT Goals (current goals can now be found in the care plan section) Acute Rehab PT Goals Patient Stated Goal: not stated PT Goal Formulation: With patient Time For Goal Achievement: 07/20/20 Potential to Achieve Goals: Good Progress towards PT goals: Progressing toward goals    Frequency    Min 2X/week      PT Plan Current  plan remains appropriate    Co-evaluation              AM-PAC PT "6 Clicks" Mobility   Outcome Measure  Help needed turning from your back to your side while in a flat bed without using bedrails?: A Little Help needed moving from lying on your back to sitting on the side of a flat bed without using bedrails?: A Lot Help needed moving to and from a bed to a chair (including a wheelchair)?: A Lot Help needed standing up from a chair using your arms (e.g., wheelchair or bedside chair)?: A Little Help needed to walk in hospital room?: A Little Help needed climbing 3-5 steps with a railing? : A Lot 6 Click Score: 15    End of Session   Activity Tolerance: Patient tolerated treatment well;Patient limited by fatigue Patient left: in chair;with call bell/phone within reach;with chair alarm set Nurse Communication: Mobility status PT Visit Diagnosis: Unsteadiness on feet (R26.81);Other abnormalities of gait and mobility (R26.89);Muscle weakness (generalized) (M62.81)     Time: 3244-0102 PT Time Calculation (min) (ACUTE ONLY): 22 min  Charges:  $Therapeutic Activity: 8-22 mins                     2:27 PM, 07/06/20 Isaac [name], MPT Physical Therapist with [facility] [phone] office [phone] mobile phone

## 2020-07-06 NOTE — ED Notes (Signed)
Pt checked and changed. Male-wick still in place and working. Pt had medium bowl movement. Pt in bed and resting peacefully at this time.

## 2020-07-06 NOTE — TOC Progression Note (Signed)
CSW received update from pts caseworker Evalee Jefferson informing CSW that she would be out of town for the rest of the week. If there are any updates we need to reach out to her supervisor Nilda Calamity [phone] ext. [phone].   CSW spoke to [place] with the [facility] who states she will be sending pts chart to her team for review. TOC to follow.

## 2020-07-06 NOTE — ED Notes (Signed)
PT ambulating with patient in hallway at this time. Patient using walker for assistance.

## 2020-07-07 NOTE — TOC Progression Note (Signed)
CSW received update from [place] with the [employer] who states pt has been denied by the clinical team due to his past behaviors.   CSW left VM for [facility], awaiting call back.   Alinda Money with [facility] will send to her team for review.   Admissions at [facility] will review after morning meeting.   [facility]- unavailable at time, will call back.   [facility]- Left another VM, still awaiting a call back. Admissions at [facility] asked that [facility] send over past H&P and EDP notes for this admission. CSW has faxed all requested documents. TOC confirmed they received documents and they are reviewing. TOC to follow.

## 2020-07-07 NOTE — ED Notes (Signed)
Pt set up food dinner.

## 2020-07-07 NOTE — ED Notes (Signed)
Ate 100% of breakfast

## 2020-07-07 NOTE — ED Notes (Signed)
Pt assisted back to bed, pericare performed with linens and brief and gown changed. Purewick changed.

## 2020-07-07 NOTE — ED Notes (Signed)
Pt up in chair asleep.

## 2020-07-07 NOTE — ED Notes (Signed)
Pt got up with one person assist.  Pt was able to use walker and get into his chair.  Pt set up for breakfast.

## 2020-07-07 NOTE — ED Notes (Signed)
Pt has been calm today, follows commands.

## 2020-07-08 LAB — RESP PANEL BY RT-PCR (FLU A&B, COVID) ARPGX2
Influenza A by PCR: NEGATIVE
Influenza B by PCR: NEGATIVE
SARS Coronavirus 2 by RT PCR: NEGATIVE

## 2020-07-08 NOTE — ED Notes (Signed)
Helped RN walk pt with walker and assisted with exteranl urinary catheter

## 2020-07-08 NOTE — ED Notes (Signed)
Pt sitting in chair, eating dinner

## 2020-07-08 NOTE — ED Notes (Signed)
Pt up from bed to bedside chair with x2 assist. Meal tray provided. Pt clean and male purewick in tack.

## 2020-07-08 NOTE — ED Notes (Signed)
Pt ambulated around nurses station with a walker and two nurse assist at his side for if needed. Pt ambulated well. Pt had a large BM before ambulating, pt was cleaned and applied in a new brief. Male pure wick in place. Pt sitting up in chair awaiting dinner.

## 2020-07-08 NOTE — TOC Progression Note (Signed)
Plan was for pt to d/c to [facility] today. However, TOC updated that pts case has been placed on hold as corporate now needs to review case before pt can transfer to facility. TOC supervisors are aware and have been attempting to get in touch with corporate for [facility]. TOC to continue reaching out for updates. CSW updated pts mother Chyrl Civatte of bed offer and that we will continue to update her when we can transfer him.

## 2020-07-08 NOTE — ED Notes (Signed)
Lunch given.

## 2020-07-08 NOTE — ED Notes (Signed)
Per the social work team" the facility has approved its self has approved him. However, corporate has put a hold on everything as they need to review the case. I have my supervisors assisting and we are all looking for a way to get in touch with corporate to move the process along if possible.". PT is go go to [facility] in [place].

## 2020-07-08 NOTE — ED Notes (Signed)
Pt ate 100% of dinner- pt given another water per request- pt content and watching TV

## 2020-07-09 NOTE — TOC Progression Note (Signed)
CSW has made calls to the following facilities due to [facility] rescinding their bed offer on pt.    [facility]: Attempted call, could not leave a message.   [facility]: Left VM for admissions.   [facility]: No answer.   [facility]: Left VM.   [facility]: Cannot accept pt.   [facility]: Will have team check.  [facility]: No LTC beds.   [facility]: No LTC beds.   [facility]: Left VM for admissions.   [facility]: Left VM for admissions.   [facility]: Left VM for admissions.   [facility]: Awaiting review by the clinical team.   [facility]: Currently on admission hold for three weeks minimum.   [facility]: No beds, admissions states they do not have an idea when they will have one.

## 2020-07-09 NOTE — ED Notes (Signed)
Pt drowsy and not wanting to get out of the bed at this time.

## 2020-07-09 NOTE — ED Notes (Signed)
Pt given dinner tray.

## 2020-07-09 NOTE — ED Notes (Signed)
Pt assisted from bed to chair.

## 2020-07-09 NOTE — TOC Progression Note (Signed)
CSW confirmed with pts mother Kristine Garbe that pt receives disability monthly. Pt receives $1,036.

## 2020-07-09 NOTE — ED Notes (Signed)
Pt ate 100% of his dinner tray. Pt walked around nurse's station with walker, pt with steady gait.

## 2020-07-10 NOTE — ED Notes (Signed)
Pt soiled bed, linens and gown changed. Peri care preformed, new male pure wick placed.

## 2020-07-10 NOTE — ED Notes (Signed)
Family at bedside. 

## 2020-07-10 NOTE — ED Notes (Signed)
ModManson Passey well soft formed stool noted.  Peri-care done.  Pt put back to bed.

## 2020-07-10 NOTE — ED Notes (Signed)
Pt has been calm and cooperative.  Pt is able to tell when he is ready to get up and go back to bed.  Pt feeds himself very well.

## 2020-07-12 NOTE — ED Notes (Signed)
Pt ate 100% of lunch

## 2020-07-12 NOTE — ED Notes (Signed)
Pt sitting in chair, pt cleansed, linen changed

## 2020-07-12 NOTE — ED Notes (Signed)
Pt sitting in bedside chair resting and appears to be in NAD. Respirations even and unlabored. Call bell within reach. Will continue to monitor.

## 2020-07-12 NOTE — ED Notes (Signed)
Family at bedside. 

## 2020-07-12 NOTE — ED Notes (Signed)
Pt resting at this time.

## 2020-07-12 NOTE — ED Notes (Signed)
Patient assisted back to bed after ambulated approximately 100 feet.

## 2020-07-12 NOTE — ED Notes (Signed)
Pt repositioned in bed for comfort and denied any other needs at this time. Will continue to monitor.

## 2020-07-12 NOTE — ED Notes (Addendum)
Pt calling out, but stated that he just wanted to wave at me. Pt provided with sandwich, chips and soda. He denied any other needs at this time. Will continue to monitor.

## 2020-07-12 NOTE — ED Notes (Signed)
Pt requesting foot boots to be placed on. Bed turned to face tv so pt could watch bball game. Pt denied any other needs at this time and appears to be in NAD. Will continue to monitor.

## 2020-07-12 NOTE — ED Notes (Signed)
Pt at 100% of breakfast.

## 2020-07-12 NOTE — ED Provider Notes (Signed)
Emergency Medicine Observation Re-evaluation Note  Isaac [name] is a 40 y.o. male, seen on rounds today.  Pt initially presented to the ED for complaints of low grade fever and agitated behavior - pt had been living at home and mother family refused his return - APS was notified then.  Patient has been boarding in ED awaiting placement at facility. Currently, the patient is resting quietly, no distress.   Physical Exam  BP 101/66   Pulse 77   Temp 98.3 F (36.8 C) (Oral)   Resp 16   Ht 1.88 m (6\' 2" )   Wt 107.5 kg   SpO2 95%   BMI 30.43 kg/m  Physical Exam General: resting Cardiac: normal rate Lungs: breathing comfortably Psych: resting quietly. Calm.   ED Course / MDM  EKG:   I have reviewed the labs performed to date as well as medications administered while in observation.  Recent changes in the last 24 hours include continued stabilization on meds, and TOC placement  Plan  Patient has been in ED 700+ hours awaiting placement with no results as of yet - unfortunate situation for patient who should have returned home on initial visit once medically/BH cleared, for placement efforts from home if that was the desired plan.  Instead, patient remains in ED setting for over a month.   TOC leadership will need to reach out to facilities with whom they have influence, or broaden scope of placement, or facilitate plan of return to his home, as keeping a patient in ED setting indefinitely is not an acceptable plan for any patient.      , MD 07/12/20 [phone]

## 2020-07-12 NOTE — ED Notes (Addendum)
Pt cleaned and moved from bedside chair to stretcher, with assistance of walker and standby assist. Pt repositioned in bed for comfort and appears to be in NAD. Pt denied any needs at this time. Lighting dimmed for comfort. Will continue to monitor.

## 2020-07-13 NOTE — ED Notes (Signed)
Pt took meds whole without any issues. Pt laughing with nurse.

## 2020-07-13 NOTE — ED Notes (Signed)
Assisted pt with breakfast.

## 2020-07-13 NOTE — ED Notes (Signed)
Pt helped back into the bed, heel boots applied.

## 2020-07-13 NOTE — ED Notes (Signed)
Dinner tray given to pt

## 2020-07-13 NOTE — ED Notes (Addendum)
Nurse reached out to Child psychotherapist and no new information received. Still waiting on responses from facilities.

## 2020-07-13 NOTE — TOC Progression Note (Addendum)
CSW has made the following calls in search of bed for pt. TOC leadership is also involved in searching for a bed for pt.   [facility]: Left VM for admissions.  [facility]: Spoke to [facility] in admissions, they currently have over 40 people on the wait list for a long term bed.   [facility]: Left VM for admissions.   [facility]: Faxed referral, awaiting response. Admissions reviewed and stated they cannot accept pt.   [facility]: No LT beds.   [facility]: No LT beds.   [facility]: Currently no LT beds but will send to sister facilities and f/u.  [facility]: Referral sent to [facility] with admissions.   [facility]: Currently they do not have any long term male beds.

## 2020-07-13 NOTE — ED Notes (Signed)
Got pt up and in car. Put on a clean and dry gown and changed all bedding

## 2020-07-13 NOTE — ED Notes (Signed)
Assumed care of pt at this time.

## 2020-07-14 NOTE — ED Notes (Signed)
Staff performed bed bath, pericare, and changed linens.

## 2020-07-14 NOTE — ED Notes (Signed)
Pt given sandwich and chips with sprite.

## 2020-07-14 NOTE — ED Notes (Signed)
Pt given bag lunches at this time Isaac [name]

## 2020-07-14 NOTE — ED Notes (Signed)
Patient assisted into bedside chair. Patient's bed linen, gown, and brief changed due to incontinence of urine. Patient is currently sitting in recliner chair at this time.

## 2020-07-14 NOTE — ED Notes (Signed)
Patient provided with new brief and gown changed due to urine incontience. Patient provided with perineum care. Patient sat up in chair and provided with breakfast meal tray. Patient voices no complaints at this time. Bed linen changed.

## 2020-07-14 NOTE — Progress Notes (Signed)
CSW updated by Lynnea Ferrier at the [facility] that pt has been denied again due to his age and being on multiple psych medications.   CSW spoke with [facility] and they will speak to the business office to see if they could take pt. CSW awaiting update.   CSW spoke with admissions at [facility] who would like CSW to call back on Monday because currently they do not have any beds.   TOC to continue looking for placement.

## 2020-07-14 NOTE — ED Notes (Signed)
Patient assisted back into bed and states he wants a nap. Patient voices no complaints at this time.

## 2020-07-15 NOTE — ED Notes (Signed)
Patient changed at this time.

## 2020-07-15 NOTE — ED Notes (Signed)
Pt helped into bed. Pt cleaned up and put into new brief due to urinary incontinence.

## 2020-07-15 NOTE — ED Notes (Signed)
Patient up to chair at this time

## 2020-07-15 NOTE — Progress Notes (Signed)
Isaac [name] with [facility] made a visit to pt in ED. Per Isaac [name], they will need to work on getting pts paperwork filled out by his legal guardian and his hospital bed to them from his mothers home. She feels that they may be able to take him on Monday if all paperwork can be completed. CSW has completed updated Fl2 and sent it via email to pts legal guardian and Isaac [name]. CSW to f/u tomorrow with pts caseworker Irine Seal to see what else needs to be done.

## 2020-07-15 NOTE — NC FL2 (Addendum)
[place] MEDICAID FL2 LEVEL OF CARE SCREENING TOOL     IDENTIFICATION  Patient Name: Isaac [name] Birthdate: 1980-11-15 Sex: male Admission Date (Current Location): 06/09/2020  [place] and [place] Number:  [name] [id number] J Facility and Address:  [facility],  [address], [place] [address]      Provider Number: [id number]  Attending Physician Name and Address:  Default, Provider, MD  Relative Name and Phone Number:  Smith,JoAnn Mother     [phone]    Current Level of Care: Hospital Recommended Level of Care: Assisted Living [facility] Prior Approval Number:    Date Approved/Denied:   PASRR Number: [id number]  Discharge Plan: Other (Comment) (Family Care Home/ALF)    Current Diagnoses: Patient Active Problem List   Diagnosis Date Noted  . 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 Cocaine use disorder (HCC) 02/07/2020  . SVT (supraventricular tachycardia) (HCC) 01/11/2020  . Acute cystitis with hematuria   . Agitation   . Acute urinary tract infection 01/01/2020  . History of bacterial meningitis 01/01/2020  . Abnormal ECG 01/01/2020  . Normocytic anemia 01/01/2020  . Combative behavior 12/31/2019  . Gastrostomy tube in place (HCC) 12/31/2019  . Bacterial meningitis 10/04/2019  . NSTEMI (non-ST elevated myocardial infarction) (HCC) 09/28/2019  . Demand ischemia (HCC) 09/28/2019  . Hypokalemia 09/28/2019    Orientation RESPIRATION BLADDER Height & Weight     Self  Normal Incontinent,External catheter Weight: 236 lb 15.9 oz (107.5 kg) Height:  6\' 2"  (188 cm)  BEHAVIORAL SYMPTOMS/MOOD NEUROLOGICAL BOWEL NUTRITION STATUS      Incontinent,Continent Diet  AMBULATORY STATUS COMMUNICATION OF NEEDS Skin   Limited Assist Verbally (Limited verbal communication) Normal                       Personal Care Assistance Level of Assistance  Bathing,Feeding,Dressing Bathing  Assistance: Maximum assistance Feeding assistance: Independent Dressing Assistance: Limited assistance Total Care Assistance: Maximum assistance   Functional Limitations Info  Sight,Hearing,Speech Sight Info: Adequate Hearing Info: Adequate Speech Info: Impaired    SPECIAL CARE FACTORS FREQUENCY                       Contractures Contractures Info: Not present    Additional Factors Info  Allergies   Allergies Info: Lactose intolerance (gi) and prok-derived products           Current Medications (07/15/2020):  This is the current hospital active medication list Current Facility-Administered Medications  Medication Dose Route Frequency Provider Last Rate Last Admin  . diazepam (VALIUM) tablet 5 mg  5 mg Oral Q12H 09/14/2020, MD   5 mg at 07/15/20 0908  . ferrous sulfate tablet 325 mg  325 mg Oral Q breakfast 09/14/20, MD   325 mg at 07/15/20 0908  . folic acid (FOLVITE) tablet 1 mg  1 mg Oral Daily 09/14/20, MD   1 mg at 07/15/20 0908  . lamoTRIgine (LAMICTAL) tablet 100 mg  100 mg Oral Daily 09/14/20, MD   100 mg at 07/15/20 09/14/20  . metoprolol tartrate (LOPRESSOR) tablet 37.5 mg  37.5 mg Oral BID 7741, MD   37.5 mg at 07/15/20 09/14/20  . OXcarbazepine (TRILEPTAL) tablet 150 mg  150 mg Oral BID 2878, MD   150 mg at 07/15/20 0908  . QUEtiapine (SEROQUEL) tablet 300 mg  300 mg Oral q AM 09/14/20,  Kayleen Memos, MD   300 mg at 07/15/20 0910  . QUEtiapine (SEROQUEL) tablet 400 mg  400 mg Oral QHS Terrilee Files, MD   400 mg at 07/14/20 2156  . senna-docusate (Senokot-S) tablet 1 tablet  1 tablet Oral QHS Derwood Kaplan, MD   1 tablet at 07/14/20 2156  . thiamine tablet 100 mg  100 mg Oral Daily Terrilee Files, MD   100 mg at 07/15/20 7616   Current Outpatient Medications  Medication Sig Dispense Refill  . diazepam (VALIUM) 5 MG tablet Take 1 tablet (5 mg total) by mouth every 12 (twelve) hours. 60 tablet 0  . ferrous  sulfate 325 (65 FE) MG tablet Take 1 tablet (325 mg total) by mouth daily with breakfast. 30 tablet 3  . folic acid (FOLVITE) 1 MG tablet Take 1 tablet (1 mg total) by mouth daily. 30 tablet 1  . lamoTRIgine (LAMICTAL) 100 MG tablet Take 1 tablet (100 mg total) by mouth daily. 30 tablet 1  . metoprolol tartrate 37.5 MG TABS Take 37.5 mg by mouth 2 (two) times daily. 60 tablet 2  . OXcarbazepine (TRILEPTAL) 150 MG tablet Take 1 tablet (150 mg total) by mouth 2 (two) times daily. 60 tablet 1  . polyethylene glycol (MIRALAX / GLYCOLAX) 17 g packet Take 17 g by mouth daily. Hold for diarrhea 30 each 1  . QUEtiapine (SEROQUEL) 300 MG tablet Take 1 tablet (300 mg total) by mouth in the morning. 30 tablet 1  . QUEtiapine (SEROQUEL) 400 MG tablet Take 1 tablet (400 mg total) by mouth at bedtime. 30 tablet 1  . senna-docusate (SENOKOT-S) 8.6-50 MG tablet Take 2 tablets by mouth 2 (two) times daily. Hold for diarrhea 60 tablet 1  . thiamine 100 MG tablet Take 1 tablet (100 mg total) by mouth daily. 30 tablet 1  . feeding supplement (ENSURE ENLIVE / ENSURE PLUS) LIQD Take 237 mLs by mouth 2 (two) times daily between meals. (Patient not taking: No sig reported)       Discharge Medications: Please see discharge summary for a list of discharge medications.  Relevant Imaging Results:  Relevant Lab Results:   Additional Information SSN: [id number]  [place], [place]

## 2020-07-16 NOTE — ED Notes (Signed)
Pt ambulated in hallway with walker. Tolerated well. Walked back to room and sitting in chair.

## 2020-07-16 NOTE — ED Notes (Signed)
Pt eating lunch at this time

## 2020-07-16 NOTE — Progress Notes (Signed)
CSW met pts case worker Billey Co and Jamas Lav and Nilda Calamity from [facility]. They wanted to see pt ambulate in the ED. After watching pt, they do not feel they can handle pt in the family care home as there is only one person assisting with pts. CSW to fax Fl2 to pts casework and she will be assisting in placement as we will need to find a new plan now that pt cannot go to the [facility]. TOC to follow.

## 2020-07-17 NOTE — ED Notes (Signed)
Pt given dinner tray, pt sitting in chair eating at this time. Pt calm, alert, and cooperative.

## 2020-07-17 NOTE — ED Notes (Signed)
Pt bathed, external catheter exchanged, and linens changed at this time. He is now resting comfortably with no signs of distress noted.

## 2020-07-18 NOTE — ED Notes (Signed)
Pt brief wet. Pt stood up to be changed. New brief applied and clean chuck pads. Pt repositioned in chair and sitting up right for dinner tray.

## 2020-07-18 NOTE — ED Notes (Signed)
Pt given bed bath and up to bedside recliner. Linens clean. pts brushed his teeth by himself. Pt awaiting breakfast tray at this time.

## 2020-07-18 NOTE — ED Notes (Signed)
Pt ambulated around whole nurses station. Pt placed in bed in dry brief, pure wick in place.

## 2020-07-19 NOTE — ED Notes (Signed)
Pt remains up in chair.  Pt is calm and cooperative.

## 2020-07-19 NOTE — ED Notes (Signed)
Pt wet, peri-care done and pt placed in chair.

## 2020-07-19 NOTE — ED Notes (Signed)
Pt sitting in chair eating chips

## 2020-07-20 MED ORDER — ONDANSETRON 8 MG PO TBDP
8.0000 mg | ORAL_TABLET | Freq: Three times a day (TID) | ORAL | Status: DC | PRN
  Administered 2020-07-20 – 2020-08-07 (×2): 8 mg via ORAL
  Filled 2020-07-20 (×2): qty 1

## 2020-07-20 NOTE — ED Notes (Signed)
Patient assisted to chair x1 after ADL care this am. Incontinent care provided, gown changed. Male external cath in place, patent and draining properly.  Patient able to feed self with tray set-up. Appetite good. Took PO meds whole without difficulty. Patient alert and cooperative with staff. Smiling and speaking in short sentences with this nurse earlier. Currently watching TV and eating snack. Will ambulate in hallway with staff shortly. No behavioral issues have been exhibited/reported.

## 2020-07-20 NOTE — ED Notes (Signed)
Patient resting quietly in bed at this time. No further emesis noted.

## 2020-07-20 NOTE — TOC Progression Note (Signed)
Transition of Care [facility] - Progression Note    Patient Details  Name: Isaac [name] MRN: [id number] Date of Birth: June 09, 1980  Transition of Care [facility]) CM/SW Contact  Barry Brunner, LCSW Phone Number: 07/20/2020, 2:36 PM  Clinical Narrative:    CSW spoke with Judeth Cornfield from DSS who requested that patient's FL2 be updated for SNF long term care and patient's required ADL assistance be updated due to reason last [facility] declined patient. See previous note. Judeth Cornfield reported that Barnie Alderman, the patient's legal guardian, had also requested notes documenting patient's behavior. CSW LVM with Melissa Price. CSW faxed fl2, and notes. CSW notified supervisor of FL2 update. TOC supervisor expressed concern with SNF due to patient's age and ability to ambulate. CSW LVM with Melissa Price to discuss FL2. CSW fax TBI residential options to DSS. TOC to follow.         Expected Discharge Plan and Services                                                 Social Determinants of Health (SDOH) Interventions    Readmission Risk Interventions Readmission Risk Prevention Plan 02/23/2020  PCP or Specialist Appt within 3-5 Days Complete

## 2020-07-20 NOTE — NC FL2 (Deleted)
[place] MEDICAID FL2 LEVEL OF CARE SCREENING TOOL     IDENTIFICATION  Patient Name: Isaac [name] Birthdate: 11-30-1980 Sex: male Admission Date (Current Location): 06/09/2020  [place] and [place] Number:  Isaac [name] [id number] J Facility and Address:  [facility],  [address], [place] [address]      Provider Number: [id number]  Attending Physician Name and Address:  Default, Provider, MD  Relative Name and Phone Number:  Rolla Etienne [phone]    Current Level of Care: Hospital Recommended Level of Care: [facility] Prior Approval Number:    Date Approved/Denied:   PASRR Number: [id number]  Discharge Plan: Other (Comment) (Group home/ ALF)    Current Diagnoses: Patient Active Problem List   Diagnosis Date Noted  . 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 Cocaine use disorder (HCC) 02/07/2020  . SVT (supraventricular tachycardia) (HCC) 01/11/2020  . Acute cystitis with hematuria   . Agitation   . Acute urinary tract infection 01/01/2020  . History of bacterial meningitis 01/01/2020  . Abnormal ECG 01/01/2020  . Normocytic anemia 01/01/2020  . Combative behavior 12/31/2019  . Gastrostomy tube in place (HCC) 12/31/2019  . Bacterial meningitis 10/04/2019  . NSTEMI (non-ST elevated myocardial infarction) (HCC) 09/28/2019  . Demand ischemia (HCC) 09/28/2019  . Hypokalemia 09/28/2019    Orientation RESPIRATION BLADDER Height & Weight     Self  Normal Incontinent (Physically able to toilet, but will need prompting) Weight: 236 lb 15.9 oz (107.5 kg) Height:  6\' 2"  (188 cm)  BEHAVIORAL SYMPTOMS/MOOD NEUROLOGICAL BOWEL NUTRITION STATUS      Continent Diet (Diet regular Room service appropriate? Yes; Fluid consistency: Thin / Does not tolerate lactose, Does not eat pork)  AMBULATORY STATUS COMMUNICATION OF NEEDS Skin   Supervision Verbally Normal                        Personal Care Assistance Level of Assistance  Bathing,Feeding,Dressing Bathing Assistance: Limited assistance Feeding assistance: Independent Dressing Assistance: Limited assistance Total Care Assistance: Limited assistance   Functional Limitations Info  Sight,Hearing,Speech Sight Info: Adequate Hearing Info: Adequate Speech Info: Impaired    SPECIAL CARE FACTORS FREQUENCY                       Contractures Contractures Info: Not present    Additional Factors Info  Code Status,Allergies,Psychotropic Code Status Info: Full Allergies Info: Lactose Intolerance (gi) Not Specified    Pork-derived Products Psychotropic Info: Valium, Lamictal, Trileptal, Seroquel,         Current Medications (07/20/2020):  This is the current hospital active medication list Current Facility-Administered Medications  Medication Dose Route Frequency Provider Last Rate Last Admin  . diazepam (VALIUM) tablet 5 mg  5 mg Oral Q12H 07/22/2020, MD   5 mg at 07/20/20 0916  . ferrous sulfate tablet 325 mg  325 mg Oral Q breakfast 07/22/20, MD   325 mg at 07/20/20 07/22/20  . folic acid (FOLVITE) tablet 1 mg  1 mg Oral Daily 7510, MD   1 mg at 07/20/20 0916  . lamoTRIgine (LAMICTAL) tablet 100 mg  100 mg Oral Daily 07/22/20, MD   100 mg at 07/20/20 0915  . metoprolol tartrate (LOPRESSOR) tablet 37.5 mg  37.5 mg Oral BID 07/22/20, MD   37.5 mg at 07/20/20 0916  . OXcarbazepine (TRILEPTAL) tablet 150 mg  150 mg Oral BID Terrilee Files, MD   150 mg at 07/20/20 6606  . QUEtiapine (SEROQUEL) tablet 300 mg  300 mg Oral q AM Terrilee Files, MD   300 mg at 07/20/20 0916  . QUEtiapine (SEROQUEL) tablet 400 mg  400 mg Oral QHS Terrilee Files, MD   400 mg at 07/19/20 2310  . senna-docusate (Senokot-S) tablet 1 tablet  1 tablet Oral QHS Derwood Kaplan, MD   1 tablet at 07/19/20 2309  . thiamine tablet 100 mg  100 mg Oral Daily Terrilee Files, MD   100  mg at 07/20/20 3016   Current Outpatient Medications  Medication Sig Dispense Refill  . diazepam (VALIUM) 5 MG tablet Take 1 tablet (5 mg total) by mouth every 12 (twelve) hours. 60 tablet 0  . ferrous sulfate 325 (65 FE) MG tablet Take 1 tablet (325 mg total) by mouth daily with breakfast. 30 tablet 3  . folic acid (FOLVITE) 1 MG tablet Take 1 tablet (1 mg total) by mouth daily. 30 tablet 1  . lamoTRIgine (LAMICTAL) 100 MG tablet Take 1 tablet (100 mg total) by mouth daily. 30 tablet 1  . metoprolol tartrate 37.5 MG TABS Take 37.5 mg by mouth 2 (two) times daily. 60 tablet 2  . OXcarbazepine (TRILEPTAL) 150 MG tablet Take 1 tablet (150 mg total) by mouth 2 (two) times daily. 60 tablet 1  . polyethylene glycol (MIRALAX / GLYCOLAX) 17 g packet Take 17 g by mouth daily. Hold for diarrhea 30 each 1  . QUEtiapine (SEROQUEL) 300 MG tablet Take 1 tablet (300 mg total) by mouth in the morning. 30 tablet 1  . QUEtiapine (SEROQUEL) 400 MG tablet Take 1 tablet (400 mg total) by mouth at bedtime. 30 tablet 1  . senna-docusate (SENOKOT-S) 8.6-50 MG tablet Take 2 tablets by mouth 2 (two) times daily. Hold for diarrhea 60 tablet 1  . thiamine 100 MG tablet Take 1 tablet (100 mg total) by mouth daily. 30 tablet 1  . feeding supplement (ENSURE ENLIVE / ENSURE PLUS) LIQD Take 237 mLs by mouth 2 (two) times daily between meals. (Patient not taking: No sig reported)       Discharge Medications: Please see discharge summary for a list of discharge medications.  Relevant Imaging Results:  Relevant Lab Results:   Additional Information PT SSN: [id number]  Barry Brunner, LCSW

## 2020-07-20 NOTE — ED Notes (Signed)
Patient noted to vomit. MD notified, awaiting new orders.

## 2020-07-20 NOTE — ED Notes (Signed)
Pt cleaned and bed changed 

## 2020-07-20 NOTE — NC FL2 (Signed)
[place] MEDICAID FL2 LEVEL OF CARE SCREENING TOOL     IDENTIFICATION  Patient Name: Isaac [name] Birthdate: September 29, 1980 Sex: male Admission Date (Current Location): 06/09/2020  [place] and [place] Number:  [name] [id number] J Facility and Address:  [facility],  [address], [place] [address]      Provider Number: [id number]  Attending Physician Name and Address:  Default, Provider, MD  Relative Name and Phone Number:  Rolla Etienne [phone]    Current Level of Care: Hospital Recommended Level of Care: Skilled Nursing Facility Prior Approval Number:    Date Approved/Denied:   PASRR Number: [id number]  Discharge Plan: SNF (LTC)    Current Diagnoses: Patient Active Problem List   Diagnosis Date Noted  . 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 Cocaine use disorder (HCC) 02/07/2020  . SVT (supraventricular tachycardia) (HCC) 01/11/2020  . Acute cystitis with hematuria   . Agitation   . Acute urinary tract infection 01/01/2020  . History of bacterial meningitis 01/01/2020  . Abnormal ECG 01/01/2020  . Normocytic anemia 01/01/2020  . Combative behavior 12/31/2019  . Gastrostomy tube in place (HCC) 12/31/2019  . Bacterial meningitis 10/04/2019  . NSTEMI (non-ST elevated myocardial infarction) (HCC) 09/28/2019  . Demand ischemia (HCC) 09/28/2019  . Hypokalemia 09/28/2019    Orientation RESPIRATION BLADDER Height & Weight     Self  Normal Incontinent (Physically able to toilet, but will need prompting) Weight: 236 lb 15.9 oz (107.5 kg) Height:  6\' 2"  (188 cm)  BEHAVIORAL SYMPTOMS/MOOD NEUROLOGICAL BOWEL NUTRITION STATUS      Continent Diet (Diet regular Room service appropriate? Yes; Fluid consistency: Thin / Does not tolerate lactose, Does not eat pork)  AMBULATORY STATUS COMMUNICATION OF NEEDS Skin   Supervision Verbally Normal                       Personal Care Assistance  Level of Assistance  Bathing,Feeding,Dressing Bathing Assistance: Limited assistance Feeding assistance: Independent Dressing Assistance: Limited assistance Total Care Assistance: Limited assistance   Functional Limitations Info  Sight,Hearing,Speech Sight Info: Adequate Hearing Info: Adequate Speech Info: Impaired    SPECIAL CARE FACTORS FREQUENCY                       Contractures Contractures Info: Not present    Additional Factors Info  Code Status,Allergies,Psychotropic Code Status Info: Full Allergies Info: Lactose Intolerance (gi) Not Specified    Pork-derived Products Psychotropic Info: Valium, Lamictal, Trileptal, Seroquel,         Current Medications (07/20/2020):  This is the current hospital active medication list Current Facility-Administered Medications  Medication Dose Route Frequency Provider Last Rate Last Admin  . diazepam (VALIUM) tablet 5 mg  5 mg Oral Q12H 07/22/2020, MD   5 mg at 07/20/20 0916  . ferrous sulfate tablet 325 mg  325 mg Oral Q breakfast 07/22/20, MD   325 mg at 07/20/20 07/22/20  . folic acid (FOLVITE) tablet 1 mg  1 mg Oral Daily 9432, MD   1 mg at 07/20/20 0916  . lamoTRIgine (LAMICTAL) tablet 100 mg  100 mg Oral Daily 07/22/20, MD   100 mg at 07/20/20 0915  . metoprolol tartrate (LOPRESSOR) tablet 37.5 mg  37.5 mg Oral BID 07/22/20, MD   37.5 mg at 07/20/20 0916  . OXcarbazepine (TRILEPTAL) tablet 150 mg  150 mg Oral BID  Terrilee Files, MD   150 mg at 07/20/20 8921  . QUEtiapine (SEROQUEL) tablet 300 mg  300 mg Oral q AM Terrilee Files, MD   300 mg at 07/20/20 0916  . QUEtiapine (SEROQUEL) tablet 400 mg  400 mg Oral QHS Terrilee Files, MD   400 mg at 07/19/20 2310  . senna-docusate (Senokot-S) tablet 1 tablet  1 tablet Oral QHS Derwood Kaplan, MD   1 tablet at 07/19/20 2309  . thiamine tablet 100 mg  100 mg Oral Daily Terrilee Files, MD   100 mg at 07/20/20 1941   Current  Outpatient Medications  Medication Sig Dispense Refill  . diazepam (VALIUM) 5 MG tablet Take 1 tablet (5 mg total) by mouth every 12 (twelve) hours. 60 tablet 0  . ferrous sulfate 325 (65 FE) MG tablet Take 1 tablet (325 mg total) by mouth daily with breakfast. 30 tablet 3  . folic acid (FOLVITE) 1 MG tablet Take 1 tablet (1 mg total) by mouth daily. 30 tablet 1  . lamoTRIgine (LAMICTAL) 100 MG tablet Take 1 tablet (100 mg total) by mouth daily. 30 tablet 1  . metoprolol tartrate 37.5 MG TABS Take 37.5 mg by mouth 2 (two) times daily. 60 tablet 2  . OXcarbazepine (TRILEPTAL) 150 MG tablet Take 1 tablet (150 mg total) by mouth 2 (two) times daily. 60 tablet 1  . polyethylene glycol (MIRALAX / GLYCOLAX) 17 g packet Take 17 g by mouth daily. Hold for diarrhea 30 each 1  . QUEtiapine (SEROQUEL) 300 MG tablet Take 1 tablet (300 mg total) by mouth in the morning. 30 tablet 1  . QUEtiapine (SEROQUEL) 400 MG tablet Take 1 tablet (400 mg total) by mouth at bedtime. 30 tablet 1  . senna-docusate (SENOKOT-S) 8.6-50 MG tablet Take 2 tablets by mouth 2 (two) times daily. Hold for diarrhea 60 tablet 1  . thiamine 100 MG tablet Take 1 tablet (100 mg total) by mouth daily. 30 tablet 1  . feeding supplement (ENSURE ENLIVE / ENSURE PLUS) LIQD Take 237 mLs by mouth 2 (two) times daily between meals. (Patient not taking: No sig reported)       Discharge Medications: Please see discharge summary for a list of discharge medications.  Relevant Imaging Results:  Relevant Lab Results:   Additional Information PT SSN: [id number]  Barry Brunner, LCSW

## 2020-07-20 NOTE — ED Provider Notes (Signed)
Emergency Medicine Observation Re-evaluation Note  Isaac [name] is a 40 y.o. male, seen on rounds today.  Pt initially presented to the ED for complaints of Agitation (Behavioral symptoms) Currently, the patient is sleeping.  Physical Exam  BP 115/70 (BP Location: Right Arm)   Pulse 78   Temp 98.2 F (36.8 C) (Oral)   Resp 14   Ht 6\' 2"  (1.88 m)   Wt 107.5 kg   SpO2 100%   BMI 30.43 kg/m  Physical Exam General: No acute distress Cardiac: Well-perfused Lungs: Nonlabored Psych: Cooperative  ED Course / MDM  EKG:   I have reviewed the labs performed to date as well as medications administered while in observation.  Recent changes in the last 24 hours include no acute changes.  Plan  Current plan is for placement. Patient is not under full IVC at this time.  I am unaware of any behavioral issues the patient has had over the last 24 hours.   , MD 07/20/20 1104

## 2020-07-20 NOTE — ED Notes (Signed)
Pt vomited. RN notified. Pt assisted back into the bed at this time.

## 2020-07-21 LAB — URINALYSIS, ROUTINE W REFLEX MICROSCOPIC
Bacteria, UA: NONE SEEN
Bilirubin Urine: NEGATIVE
Glucose, UA: NEGATIVE mg/dL
Ketones, ur: NEGATIVE mg/dL
Leukocytes,Ua: NEGATIVE
Nitrite: NEGATIVE
Protein, ur: NEGATIVE mg/dL
RBC / HPF: >50 RBC/hpf — ABNORMAL HIGH (ref 0–5)
Specific Gravity, Urine: 1.021 (ref 1.005–1.030)
pH: 5 (ref 5.0–8.0)

## 2020-07-21 NOTE — ED Notes (Signed)
Pt noted to be more altered than normal by nursing staff. EDP notified.

## 2020-07-21 NOTE — ED Provider Notes (Signed)
Patient more agitated last night. Concern for recurrence of UTI so UA was ordered. Attempted cath without success, but clean catch specimen is neg for signs of infection. Some blood from cath attempts.    Pollyann Savoy, MD 07/21/20 1005

## 2020-07-21 NOTE — ED Notes (Signed)
Meal tray provided to patient. Pt being  Calm and cooperative with staff.

## 2020-07-21 NOTE — ED Notes (Signed)
Attempted to straight cath pt. Unable to advance catheter. Straight cath attempted by second RN, also unsuccessful.

## 2020-07-21 NOTE — ED Provider Notes (Signed)
Emergency Medicine Observation Re-evaluation Note  Isaac [name] is a 40 y.o. male, seen on rounds today.  Pt initially presented to the ED for complaints of Agitation (Behavioral symptoms) He has been in the emergency room for greater than 900 hours.  Initially treated for UTI.  Placement is being attempted for this patient.  Currently, the patient is resting comfortably.  Per nursing staff however, patient has had increased combative behavior.  They report that he is acting similarly to when he was treated for a urinary tract infection and they have noted that his urine has been foul-smelling.  They requested my evaluation and appropriate orders.  Physical Exam  BP 131/68   Pulse 98   Temp 98.8 F (37.1 C)   Resp 18   Ht 1.88 m (6\' 2" )   Wt 107.5 kg   SpO2 99%   BMI 30.43 kg/m  Physical Exam General: Resting comfortably Cardiac: Sinus rhythm Lungs: No distress Psych: Cooperative  ED Course / MDM  EKG:   I have reviewed the labs performed to date as well as medications administered while in observation.  Recent changes in the last 24 hours include increased agitation above baseline.  Plan  Current plan is for repeat urinalysis and urine culture.  Disposition per transition of care team.   , MD 07/21/20 [phone]

## 2020-07-21 NOTE — Progress Notes (Signed)
CSW spoke with pts guardian Barnie Alderman (legal guardian). She asked that CSW fax pts referral to [facility]. CSW spoke with admissions at [facility] who asked that fax be sent to [phone]. CSW faxed referral, will inquire about update tomorrow as CSW has reached out and awaiting update currently.

## 2020-07-21 NOTE — ED Notes (Signed)
Pt took pills without issue. Stated that he still didn't feel good from earlier in the day. No behavioral issues exhibited/reported.

## 2020-07-22 NOTE — ED Notes (Signed)
Pt wanting to get up in recliner. Pt was cleaned up and helped to recliner.

## 2020-07-22 NOTE — ED Notes (Signed)
Patient OOB to chair at this time. Cooperative with staff.

## 2020-07-22 NOTE — ED Notes (Signed)
ADL care provided at this time. Patient placed in bed. Cooperative with care.

## 2020-07-22 NOTE — Progress Notes (Signed)
Attempted call to Barnie Alderman, pts legal guardian, left VM for call back.   CSW attempted to call the following facilities to inquire about bed availability.   [facility]: No LTC medicaid beds.   [facility]): No LTC medicaid beds.   [facility]: No answer after multiple attempts.   [facility]: No LTC medicaid beds.   [facility]: Isaac [name] [phone] in admissions will have regional look over referral. Referral sent via fax to [phone].   [facility]: Referral sent to admissions via email to [email or web address]   [facility]: No LTC medicaid beds this week.   [facility]: No LTC medicaid beds.   [facility]: No LTC medicaid beds.

## 2020-07-22 NOTE — ED Notes (Signed)
Lunch tray given. 

## 2020-07-23 LAB — URINE CULTURE: Culture: <10000 — AB

## 2020-07-23 NOTE — ED Notes (Signed)
Pt waving at this RN to come into room. Upon entering room, pt smiled and asked if he could get something to eat. Sandwich, chips and drink were provided. Pt denied any other needs at this time and appears to be in NAD. Primary RN made aware.

## 2020-07-23 NOTE — ED Notes (Signed)
Pt sitting in chair.

## 2020-07-23 NOTE — ED Notes (Signed)
Pt provided bath, gown and full linen change complete. External male catheter replaced with condom catheter. Pt medicated per MAR. VS updated and stable. Soda at bedside, pt denies needs at this time. Bed alarm in place. Bed locked and low. Will continue to monitor.

## 2020-07-23 NOTE — ED Notes (Signed)
Pt condom catheter removed as it was almost fully removed by pt and replaced with external male catheter, pt and underpad dry, VS updated and stable. Pt denies needs at this time. Call bell within reach. Bed locked and low. Bed alarm in place. Will continue to monitor.

## 2020-07-23 NOTE — ED Notes (Signed)
Pt up in chair, cleansed and new depends placed.

## 2020-07-23 NOTE — Evaluation (Signed)
Physical Therapy Evaluation Patient Details Name: Isaac [name] MRN: [id number] DOB: 06/25/1980 Today's Date: 07/23/2020   History of Present Illness  Isaac [name] is a 40 y.o. male.  He has a history of organic brain syndrome.  5 caveat.  He is brought in by EMS for fever and change in mental status.  Apparently he has been more combative at home, lives with mother.  EMS reports a temp of 100.6 although afebrile here.  Patient endorses cough, abdominal pain, dysuria.  He is not able to elaborate on any of those.  Unknown of COVID v    Clinical Impression  Patient demonstrates slow labored movement for sitting up at bedside, Min assist to complete sit to stands, fair/good tolerance for ambulation in hallway with constant drifting to the left most of the time, limited secondary to fatigue and tolerated sitting up in chair after therapy - RN notified.  Patient will benefit from continued physical therapy in hospital and recommended venue below to increase strength, balance, endurance for safe ADLs and gait with goal to  concurrently ambulate with nursing staff and to transition to nursing staff for ambulation daily 3x/week for length of stay.      Follow Up Recommendations Supervision for mobility/OOB;Supervision/Assistance - 24 hour;SNF    Equipment Recommendations  None recommended by PT    Recommendations for Other Services       Precautions / Restrictions Precautions Precautions: Fall Restrictions Weight Bearing Restrictions: No      Mobility  Bed Mobility Overal bed mobility: Needs Assistance Bed Mobility: Supine to Sit     Supine to sit: Min assist     General bed mobility comments: Patient demonstrates slow labored movement requiring assistance to move BLE    Transfers Overall transfer level: Needs assistance Equipment used: Rolling walker (2 wheeled) Transfers: Sit to/from [employer] Sit to Stand: Min assist Stand pivot transfers: Mod assist        General transfer comment: Tendency to sit before close enough to chair requiring Mod assist to avoid falling  Ambulation/Gait Ambulation/Gait assistance: Min assist;Mod assist Gait Distance (Feet): 130 Feet Assistive device: Rolling walker (2 wheeled) Gait Pattern/deviations: Decreased step length - right;Decreased step length - left;Decreased stride length;Drifts right/left Gait velocity: decreased   General Gait Details: tends to drift left during ambulation, requires tactile assistance to move RW when making turns, slows cadence once fatigued  Stairs            Wheelchair Mobility    Modified Rankin (Stroke Patients Only)       Balance Overall balance assessment: Needs assistance Sitting-balance support: Feet unsupported;No upper extremity supported Sitting balance-Leahy Scale: Good Sitting balance - Comments: seated at EOB   Standing balance support: During functional activity;Bilateral upper extremity supported Standing balance-Leahy Scale: Fair Standing balance comment: using RW                             Pertinent Vitals/Pain Pain Assessment: No/denies pain    Home Living Family/patient expects to be discharged to:: Private residence Living Arrangements: Parent;Non-relatives/Friends Available Help at Discharge: Family;Available 24 hours/day Type of Home: House Home Access: Ramped entrance     Home Layout: One level Home Equipment: Shower seat - built in;Walker - 2 wheels      Prior Function Level of Independence: Needs assistance   Gait / Transfers Assistance Needed: One person assist using RW for short distanced household ambulation  ADL's / Homemaking Assistance Needed:  assisted by family, takes bed baths        Hand Dominance   Dominant Hand: Right    Extremity/Trunk Assessment   Upper Extremity Assessment Upper Extremity Assessment: Overall WFL for tasks assessed    Lower Extremity Assessment Lower Extremity  Assessment: Generalized weakness       Communication   Communication: Expressive difficulties  Cognition Arousal/Alertness: Awake/alert Behavior During Therapy: WFL for tasks assessed/performed Overall Cognitive Status: History of cognitive impairments - at baseline                                        General Comments      Exercises     Assessment/Plan    PT Assessment    PT Problem List Decreased strength;Decreased activity tolerance;Decreased balance;Decreased mobility       PT Treatment Interventions Gait training;DME instruction;Stair training;Functional mobility training;Therapeutic activities;Therapeutic exercise;Balance training;Patient/family education    PT Goals (Current goals can be found in the Care Plan section)  Acute Rehab PT Goals Patient Stated Goal: not stated PT Goal Formulation: With patient Time For Goal Achievement: 08/03/20 Potential to Achieve Goals: Fair    Frequency Min 2X/week   Barriers to discharge        Co-evaluation               AM-PAC PT "6 Clicks" Mobility  Outcome Measure Help needed turning from your back to your side while in a flat bed without using bedrails?: A Little Help needed moving from lying on your back to sitting on the side of a flat bed without using bedrails?: A Lot Help needed moving to and from a bed to a chair (including a wheelchair)?: A Lot Help needed standing up from a chair using your arms (e.g., wheelchair or bedside chair)?: A Little Help needed to walk in hospital room?: A Little Help needed climbing 3-5 steps with a railing? : A Lot 6 Click Score: 15    End of Session   Activity Tolerance: Patient tolerated treatment well;Patient limited by fatigue Patient left: in chair;with call bell/phone within reach Nurse Communication: Mobility status PT Visit Diagnosis: Unsteadiness on feet (R26.81);Other abnormalities of gait and mobility (R26.89);Muscle weakness (generalized)  (M62.81)    Time: 3845-3646 PT Time Calculation (min) (ACUTE ONLY): 32 min   Charges:     PT Treatments $Therapeutic Activity: 23-37 mins        2:50 PM, 07/23/20 Ocie Bob, MPT Physical Therapist with [facility] [phone] office [phone] mobile phone

## 2020-07-23 NOTE — ED Notes (Signed)
Family at bedside. 

## 2020-07-23 NOTE — ED Notes (Signed)
Pt had large BM.  Pt cleansed, brief applied.

## 2020-07-23 NOTE — ED Notes (Signed)
Pt eating lunch

## 2020-07-24 NOTE — ED Notes (Addendum)
I assumed care of pt at this time and rounded on pt. Pt is currently sitting in bedside chair watching tv. Pt denied need for food/drink. He appears to be in NAD at this time. Will continue to monitor.

## 2020-07-25 NOTE — ED Notes (Signed)
Pt assisted x1 from bedside chair to bed. Pt cleaned. New brief applied. Pt repositioned in bed for comfort. Lights dimmed to promote rest. Pt denied any other needs at this time and appears to be in NAD. Call bell within reach, bed in low position. Will continue to monitor.

## 2020-07-25 NOTE — ED Notes (Signed)
Also changed canister from male purewick

## 2020-07-25 NOTE — ED Notes (Signed)
Cleaned pt with assist from [place] NT

## 2020-07-25 NOTE — ED Notes (Signed)
Rounded on pt. Pt sitting in bedside chair dozing off. I asked pt if he was ready to get in the bed because he's falling asleep. Pt said, "Not yet." I told pt that I'm going to come back by in a few minutes, and if he's sleeping, I'm going to help him get into bed. He said ok.

## 2020-07-26 NOTE — ED Notes (Signed)
Pt cleaned up of urine. Ambulated patient with minimal assist 272ft pt calm cooperative and now in bed

## 2020-07-26 NOTE — ED Notes (Signed)
 urine output throughout day

## 2020-07-26 NOTE — ED Notes (Signed)
Patient engaging in conversation with the nurse and laughing within the conversation.

## 2020-07-26 NOTE — ED Notes (Signed)
Patient had large bowel movement on self. Patient given bed bath and provided with new gown and bed linen changed. Patient declined to walk with this nurse and only wanted to sit in the recliner. Patient's external catheter changed.

## 2020-07-26 NOTE — ED Notes (Signed)
Patient has eaten 100% of his breakfast and lunch today. Patient setup for meal tray at dinner.

## 2020-07-26 NOTE — ED Notes (Signed)
Patient ate 100% of dinner and cleaned off at this time.

## 2020-07-26 NOTE — ED Notes (Signed)
Report received from night shift RN. Patient alert and awake lying in bed. Patient voices no complaints at this time. Respirations even and unlabored.

## 2020-07-26 NOTE — ED Notes (Signed)
Patient ambulated approximately 20 feet with this nurse. Patient ambulated to wheelchair and rolled outside with this nurse. Patient engaged in conversation and laughed occasionally. Patient seems in good spirits at this time. Patient provided with new gown and brief and cleansed.

## 2020-07-26 NOTE — Progress Notes (Signed)
CSW spoke with [facility] admissions who states they still do not have a bed but to continue calling.   CSW reached out to [facility] who states they have LTC Medicaid beds open right now and they will review pts referral again. CSW awaiting answer.   CSW to continue calling facilities in search of placement for pt.

## 2020-07-26 NOTE — ED Notes (Signed)
Patient ate 100% of his meal tray at this time.

## 2020-07-27 NOTE — ED Notes (Signed)
Gave bed bath to pt, changed pt's gown and diaper. Moved pt to chair and gave him his food tray. Pt's behavior was excellent.

## 2020-07-27 NOTE — ED Notes (Signed)
Cleaned pt up, changed sheets,diaper,gown. Pt had great behavior.

## 2020-07-27 NOTE — ED Notes (Signed)
Pt allowed staff to changed and wash up. Pt still in bed

## 2020-07-27 NOTE — Progress Notes (Signed)
Physical Therapy Treatment Patient Details Name: Isaac [name] MRN: [id number] DOB: 11-09-1980 Today's Date: 07/27/2020    History of Present Illness Isaac [name] is a 40 y.o. male.  He has a history of organic brain syndrome.  5 caveat.  He is brought in by EMS for fever and change in mental status.  Apparently he has been more combative at home, lives with mother.  EMS reports a temp of 100.6 although afebrile here.  Patient endorses cough, abdominal pain, dysuria.  He is not able to elaborate on any of those.  Unknown of COVID v    PT Comments    Patient presents up in chair (assisted by nursing staff) and agreeable for therapy.  Patient requires Min assist mostly due to patient frequently bumping into nearby objects during gait training, tends to drift left/right and requires occasional verbal cues for safety.  Patient went back to bed after therapy - RN aware.  Patient will benefit from continued physical therapy in hospital and recommended venue below to increase strength, balance, endurance for safe ADLs and gait.   Follow Up Recommendations  Supervision for mobility/OOB;Supervision/Assistance - 24 hour;SNF     Equipment Recommendations  None recommended by PT    Recommendations for Other Services       Precautions / Restrictions Precautions Precautions: Fall Restrictions Weight Bearing Restrictions: No    Mobility  Bed Mobility Overal bed mobility: Needs Assistance Bed Mobility: Sit to Supine     Supine to sit: Min guard     General bed mobility comments: demonstrates slow labored movement with good return for using BUE/LE to reposition self in bed    Transfers Overall transfer level: Needs assistance Equipment used: Rolling walker (2 wheeled) Transfers: Sit to/from [employer] Sit to Stand: Min assist Stand pivot transfers: Mod assist       General transfer comment: good return for stepping close to bed before  sitting  Ambulation/Gait Ambulation/Gait assistance: Min assist Gait Distance (Feet): 200 Feet Assistive device: Rolling walker (2 wheeled) Gait Pattern/deviations: Decreased step length - right;Decreased step length - left;Decreased stride length;Drifts right/left Gait velocity: decreased   General Gait Details: requires assistance to avoid bumping into nearby objects   Stairs             Wheelchair Mobility    Modified Rankin (Stroke Patients Only)       Balance Overall balance assessment: Needs assistance Sitting-balance support: Feet unsupported;No upper extremity supported Sitting balance-Leahy Scale: Good Sitting balance - Comments: seated at EOB   Standing balance support: During functional activity;Bilateral upper extremity supported Standing balance-Leahy Scale: Fair Standing balance comment: using RW                            Cognition Arousal/Alertness: Awake/alert Behavior During Therapy: WFL for tasks assessed/performed Overall Cognitive Status: History of cognitive impairments - at baseline                                        Exercises      General Comments        Pertinent Vitals/Pain Pain Assessment: No/denies pain    Home Living                      Prior Function            PT Goals (  current goals can now be found in the care plan section) Acute Rehab PT Goals Patient Stated Goal: not stated PT Goal Formulation: With patient Time For Goal Achievement: 08/03/20 Potential to Achieve Goals: Fair Progress towards PT goals: Progressing toward goals    Frequency    Min 2X/week      PT Plan Current plan remains appropriate    Co-evaluation              AM-PAC PT "6 Clicks" Mobility   Outcome Measure  Help needed turning from your back to your side while in a flat bed without using bedrails?: A Little Help needed moving from lying on your back to sitting on the side of a flat  bed without using bedrails?: A Little Help needed moving to and from a bed to a chair (including a wheelchair)?: A Little Help needed standing up from a chair using your arms (e.g., wheelchair or bedside chair)?: A Little Help needed to walk in hospital room?: A Little Help needed climbing 3-5 steps with a railing? : A Lot 6 Click Score: 17    End of Session   Activity Tolerance: Patient tolerated treatment well;Patient limited by fatigue Patient left: in bed;with call bell/phone within reach Nurse Communication: Mobility status PT Visit Diagnosis: Unsteadiness on feet (R26.81);Other abnormalities of gait and mobility (R26.89);Muscle weakness (generalized) (M62.81)     Time: 0354-6568 PT Time Calculation (min) (ACUTE ONLY): 23 min  Charges:  $Therapeutic Activity: 23-37 mins                     4:00 PM, 07/27/20 Ocie Bob, MPT Physical Therapist with [facility] [phone] office [phone] mobile phone

## 2020-07-28 NOTE — ED Notes (Signed)
Pt sleeping and chest rising and falling evenly.

## 2020-07-28 NOTE — ED Notes (Signed)
Pt still hungry after breakfast tray. Additional sandwich , chips and juice given to pt.

## 2020-07-28 NOTE — ED Notes (Signed)
Pt continues to refuse pm meds. Will attempt later.

## 2020-07-28 NOTE — ED Notes (Signed)
Gave pt soda and crackers

## 2020-07-28 NOTE — ED Notes (Signed)
Pt ambulatory about the department with minimal assistance, pt appears stronger than in the past and was able to walk from his room to triage and around the nurses station without stopping.  Pt helped back into bed.

## 2020-07-28 NOTE — ED Notes (Signed)
Pt refusing PM meds. Becomes agitated and physically aggressive when approached. Will try again later.

## 2020-07-28 NOTE — ED Notes (Signed)
Pt gown and diaper changed

## 2020-07-28 NOTE — ED Notes (Signed)
Pt willing to take pm meds.

## 2020-07-28 NOTE — ED Notes (Signed)
Pt lying in bed watching tv, no complaints at this time

## 2020-07-28 NOTE — ED Notes (Signed)
Pt incontinent of stool in bed, attempted to change pt. Pt resistant and swatting at staff. Pt care tech into speak with pt and was able to convince pt to transfer to chair for bed change.

## 2020-07-28 NOTE — ED Notes (Signed)
Assisted pt back to bed from recliner after receiving perineal care and cleansed of incontinent stool. Clean brief applied. Pt ambulated with walker and gait belt 100 feet in hallway with 2 staff to assist. Pt returned to bed.

## 2020-07-29 NOTE — ED Notes (Addendum)
Pt provided with sandwich, chips and ginger ale. Pt feeding self and not c/o any other needs at this time.   Pt appears to be in good spirits this evening and is jamming to the music that's being played for him on a cellphone. Will continue to monitor.

## 2020-07-29 NOTE — ED Notes (Signed)
Pt ambulated self, with walker and standby assist, from bedside chair to bed.   Pt was cleaned. New brief and male-wick applied.  Pt repositioned in bed for comfort and provided with multiple blankets. Pt denied any other needs at this time and appears to be in NAD. Call bell within reach, bed in low position. Will continue to monitor.

## 2020-07-29 NOTE — ED Notes (Signed)
Patient sleeping at this time. Patient clean and dry at this time.

## 2020-07-29 NOTE — ED Notes (Signed)
Pt took all meds without issue.

## 2020-07-29 NOTE — ED Notes (Signed)
Pt clean and dry. He was ambulated to the recliner with his call bell and drink in reach.

## 2020-07-29 NOTE — ED Notes (Addendum)
I assumed care of patient at this time and rounded on pt. He is sitting comfortably in bedside chair.   Pt provided with ginger ale and denied any other needs at this time.  Male-wick was disconnected upon entering room and floor was covered in urine. Urine was cleaned up and housekeeping was called to mop floor.

## 2020-07-29 NOTE — ED Notes (Signed)
Pt took a long walk with 2 standby assists.

## 2020-07-29 NOTE — Progress Notes (Signed)
Progress over the last three days is as follows:   [facility] currently has no long term beds.   Voicemail left for admissions at [facility] and [facility], they have previously declined due to no bed however, CSW reaching out to see if there is a bed currently.   CSW spoke to admissions at [facility], patients need to be fully vaccinated for COVID.   CSW spoke with pts mother who states pt is not COVID vaccinated though she has asked for him to be. When pt has gone to his PCP he will say no though pts mother feels he does not know what he is saying no to.  Pts mother has been court appointed as pts legal guardian. She would like pt to be COVID vaccinated, CSW updated TOC supervisor of this.   CSW spoke with Roddie Mc with [facility] who states that she is not sure pt has the correct medicaid assistance to work with their [facility]. CSW spoke with Roddie Mc who stated that she will speak with another administrator   CSW attempted to reach out to [facility] rest home, no numbers currently working.   Voicemail left for admissions with [facility].   Multiple calls made to [facility], numbers listed are no currently working.   CSW to continue reaching out to facilities daily inquiring about bed possibility.

## 2020-07-29 NOTE — ED Provider Notes (Signed)
Emergency Medicine Observation Re-evaluation Note  Isaac [name] is a 40 y.o. male, seen on rounds today.  Pt initially presented to the ED for complaints of Agitation (Behavioral symptoms) Currently, the patient is calm, alert, no acute distress.   Physical Exam  BP 126/77 (BP Location: Right Arm)   Pulse 93   Temp 98.2 F (36.8 C) (Oral)   Resp 20   Ht 1.88 m (6\' 2" )   Wt 107.5 kg   SpO2 100%   BMI 30.43 kg/m  Physical Exam General: calm, alert Cardiac: regular rate  Lungs: breathing comfortably Psych: calm, alert.   ED Course / MDM  EKG:   I have reviewed the labs performed to date as well as medications administered while in observation.  Recent changes in the last 24 hours include stabilization of behavior/mood in ED, TOC evaluation and work on placement.   Plan  Current plan is for TOC placement. Will reach out to [facility] leadership today as patients ED stay unacceptably long for patient, and no recent movement/progress.    [facility], MD 07/29/20 [phone]

## 2020-07-30 NOTE — Progress Notes (Signed)
CSW spoke with [facility] who has agreed to come assess pt tomorrow Saturday May, 28 at 2pm for possible placement at there facility. CSW spoke with weekend social worker Bria who states that she will come to the ED when they arrive. CSW has updated Stephania Fragmin, RN and [facility] supervisor Sharol Roussel as well. TOC to follow.

## 2020-07-30 NOTE — ED Notes (Signed)
In room to change patient and noted him to be agitated. Attempted to swing at nurse and cuss. Patient incontinent of B&B. X4 staff members required to change patient. Refused to get OOB at this time and earlier when PT to ED to work with patient. Currently consuming snacks.

## 2020-07-30 NOTE — Progress Notes (Signed)
PT Cancellation Note  Patient Details Name: Sami Froh MRN: [id number] DOB: 05-01-1980   Cancelled Treatment:    Reason Eval/Treat Not Completed: Patient declined, no reason specified.  Patient declined therapy in AM and PM - RN notified.     3:16 PM, 07/30/20 Ocie Bob, MPT Physical Therapist with [facility] [phone] office [phone] mobile phone

## 2020-07-30 NOTE — ED Provider Notes (Signed)
Emergency Medicine Observation Re-evaluation Note  Reginal Wojcicki is a 40 y.o. male, seen on rounds today.  Pt initially presented to the ED for complaints of Agitation (Behavioral symptoms) Currently, the patient is resting comfortably and acknowledges provider at bedside.  Physical Exam  BP 135/75 (BP Location: Left Arm)   Pulse 81   Temp 98.5 F (36.9 C) (Oral)   Resp 17   Ht 6\' 2"  (1.88 m)   Wt 107.5 kg   SpO2 100%   BMI 30.43 kg/m  Physical Exam General: Calm Cardiac: Normal heart rate Lungs: Normal respiratory rate Psych: Not responding to internal stimuli  ED Course / MDM  EKG:   I have reviewed the labs performed to date as well as medications administered while in observation.  Recent changes in the last 24 hours include remaining cooperative.  Plan  Current plan is for placement. Patient is not under full IVC at this time.   , MD 07/30/20 1010

## 2020-07-30 NOTE — ED Notes (Addendum)
Pt woken up and checked to see if he was soiled. Pt's brief is clean and dry at this time.   Pt repositioned in bed for comfort and denied any other needs at this time. Call bell within reach, bed in low position. Will continue to monitor.

## 2020-07-30 NOTE — ED Notes (Signed)
Patient alert and cooperative with care at this time. Able to feed self with tray set-up. Male external cath in place. Took PO meds without difficulty.

## 2020-07-31 NOTE — ED Notes (Signed)
Pt in w/c eating lunch.  Calm and cooperative.

## 2020-07-31 NOTE — ED Notes (Signed)
Pt has had uneventful day.  Family member at bedside.

## 2020-07-31 NOTE — ED Notes (Addendum)
Pt taken out outside, following eating lunch.

## 2020-07-31 NOTE — ED Notes (Signed)
Large brown soft stool noted.  Peri-care done.  Pt able to get up with one person assist.  Pt able to walk with walker.  Pt taken outside for 20 minutes in w/c.  Tolerated well.  Pt want to stay in w/c and go back outside after lunch.  Calm and cooperative. Ate 100% of breakfast.

## 2020-07-31 NOTE — TOC Progression Note (Signed)
Transition of Care [facility]) - Progression Note    Patient Details  Name: Isaac [name] MRN: [id number] Date of Birth: [birth year]  [facility] CM/SW Contact  Natasha Bence, LCSW Phone Number: 07/31/2020, 7:18 PM  Clinical Narrative:    CSW met with [facility] rep to evaluate patient. [facility] reported that they would need Covid Test, TB test, and to identify if patient's medicaid contains the innovation waiver. Rep also reported that they will be on vacation until June 20th and will then be able to provide decision about acceptance to [facility]. TOC to follow.     Barriers to Discharge: ED Patient/Family Requesting Placement but Has No Payor,ED Facility/Family Refusing to Allow Patient to Return,ED Unsafe disposition  Expected Discharge Plan and Services                                                 Social Determinants of Health (SDOH) Interventions    Readmission Risk Interventions Readmission Risk Prevention Plan 02/23/2020  PCP or Specialist Appt within 3-5 Days Complete

## 2020-07-31 NOTE — ED Provider Notes (Signed)
Emergency Medicine Observation Re-evaluation Note  Isaac [name] is a 40 y.o. male, seen on rounds today.  Pt initially presented to the ED for complaints of Agitation (Behavioral symptoms) Currently, the patient is very calm - as he has been the entire ED stay  He is cleared for placement by TOC.Marland Kitchen  Physical Exam  BP 121/84   Pulse 74   Temp 98.5 F (36.9 C) (Oral)   Resp 18   Ht 1.88 m (6\' 2" )   Wt 107.5 kg   SpO2 99%   BMI 30.43 kg/m  Physical Exam General: awake, and alert, cooperative and calm Cardiac: normal heart rate Lungs: clear Psych: calm and well appearing  ED Course / MDM  EKG:   I have reviewed the labs performed to date as well as medications administered while in observation.  Recent changes in the last 24 hours include None - still very stable awaiting placement.  Plan  Current plan is for placement by TOC.  ccurrent plan is for TOC placement.  Patients ED stay unacceptably long for patient, and no recent movement/progress.  Patient is not under full IVC at this time.   , MD 07/31/20 08/02/20

## 2020-08-01 NOTE — ED Notes (Signed)
Pt found soiled in urine, suction canister was not hooked up. Pt cleaned and dried, linens changed, equipment changed. Pt placed in bed. Call light within reach.

## 2020-08-01 NOTE — ED Provider Notes (Signed)
Emergency Medicine Observation Re-evaluation Note  Isaac [name] is a 40 y.o. male, seen on rounds today.  Pt initially presented to the ED for complaints of Agitation (Behavioral symptoms) Currently, the patient is calm and cooperative. Awaiting TOC placement.  Physical Exam  BP 104/63 (BP Location: Right Arm)   Pulse 76   Temp 97.7 F (36.5 C) (Oral)   Resp 14   Ht 6\' 2"  (1.88 m)   Wt 107.5 kg   SpO2 100%   BMI 30.43 kg/m  Physical Exam General: Awake, alert.   Cardiac: Well perfused Lungs: Even, unlabored respirations  Psych: Calm  ED Course / MDM  EKG:   I have reviewed the labs performed to date as well as medications administered while in observation.  Recent changes in the last 24 hours include none. Awaiting placement.   Plan  Current plan is for TOC placement. Patient is not under full IVC at this time.   , MD 08/01/20 [phone]

## 2020-08-01 NOTE — ED Notes (Signed)
Patient up to chair

## 2020-08-01 NOTE — ED Notes (Signed)
ADL care provided at this time. Patient cooperative with care.

## 2020-08-01 NOTE — ED Notes (Signed)
Mother to bedside with lunch at this time.

## 2020-08-01 NOTE — ED Notes (Signed)
Patient remains up in chair, laughing and attempting to communicate with staff. Snacks provided. Patient clean, dry at this time with purwick draining effectively.

## 2020-08-02 NOTE — ED Notes (Signed)
Pt in bed with eyes closed, resps even and unlabored.  

## 2020-08-02 NOTE — ED Notes (Signed)
Pt in bed, asked pt if he would like to go for a walk or have a shower, pt refused, pt states that he would just like to get up to his chair, helped pt up to his chair, changed bedding, pt has pure wick in place, asked pt if he wanted to take a shower, pt refused, will re evaluate.

## 2020-08-02 NOTE — TOC Progression Note (Addendum)
Transition of Care [facility]) - Progression Note    Patient Details  Name: Isaac [name] MRN: [id number] Date of Birth: 25-May-1980  [facility] CM/SW Contact  Marina Goodell Phone Number: [phone] 08/02/2020, 9:09 AM  Clinical Narrative:     Weekend CSW spoke with [facility] rep for possible placement (patient was assessed on 07/31/2020) [facility] will require a TB test, COVID test and confirmation the patient has the [facility] innovations waiver via their Kathalene Frames [facility] [phone]. [facility] will not have availability until June 20th, 2022, COVID test and TB test will be requested closer to that date. TOC will continue to follow.     Barriers to Discharge: ED Patient/Family Requesting Placement but Has No Payor,ED Facility/Family Refusing to Allow Patient to Return,ED Unsafe disposition  Expected Discharge Plan and Services                                                 Social Determinants of Health (SDOH) Interventions    Readmission Risk Interventions Readmission Risk Prevention Plan 02/23/2020  PCP or Specialist Appt within 3-5 Days Complete

## 2020-08-02 NOTE — ED Notes (Signed)
Pt in bed, pt finished his meal tray, pt offers no complaints at this time.

## 2020-08-02 NOTE — ED Notes (Signed)
Offered pt a walk and a shower, pt ambulatory around the department, during walk pt became agitated when he was not allowed to walk into the doctors office, attempted to de escalate pt, pt continued to be agitated, helped pt into a wheel chair and returned pt to his room, offered food, pt agrees, snack given, pt calmer at this time, other staff at bedside, pt had small brown bm, other staff cleaned pt, depends replaced, pt helped back into bed/chair

## 2020-08-03 NOTE — ED Notes (Signed)
Family at bedside. 

## 2020-08-03 NOTE — ED Notes (Signed)
Pt cleansed, new depends placed, urine canister emptied ( output), new gown.  Pt resting in bed.

## 2020-08-03 NOTE — ED Notes (Signed)
Pt given lunch tray.

## 2020-08-03 NOTE — ED Notes (Signed)
Pt cleansed, linen changed in bed, put up to walker, ambulated in room with standby assistance, pt cleansed, teeth brushed, pt depends changed, purwick in place, pt sitting up in chair given a snack and a drink.  Pt with in clear view of nurses station.  Will continue to monitor.

## 2020-08-03 NOTE — ED Notes (Signed)
Pt calm and cooperative. Slept during the whole nightt

## 2020-08-03 NOTE — ED Notes (Signed)
Pt given breakfast tray ate 100%

## 2020-08-03 NOTE — Progress Notes (Signed)
CM spoke with [facility] CC Lavella Hammock, who reports patient has been reviewed for their IDD care coordination and has been denied based on his brain injury being organic/ medical based.  Based on this denial, Shelly Coss will not service as LME or be able to assist with placement, patient also does not qualify for innovations waiver as a result.  CM spoke with DSS CSW as well as supervisor Carrier [place], at this time no suggestions for placement options are available, DSS asks TOC to contact [facility] again despite numerous denials at the corporate level.  Spoke with [employer] CSW and updated on latest information, TOC continues to follow and search for placement.

## 2020-08-03 NOTE — ED Notes (Signed)
Full linen change done  

## 2020-08-03 NOTE — Progress Notes (Signed)
Physical Therapy Treatment Patient Details Name: Isaac [name] MRN: [id number] DOB: [birth year] Today's Date: 08/03/2020    History of Present Illness Isaac [name] is a 40 y.o. male.  He has a history of organic brain syndrome.  5 caveat.  He is brought in by EMS for fever and change in mental status.  Apparently he has been more combative at home, lives with mother.  EMS reports a temp of 100.6 although afebrile here.  Patient endorses cough, abdominal pain, dysuria.  He is not able to elaborate on any of those.  Unknown of COVID v    PT Comments    Patient presents seated in chair and agreeable for therapy.  Patient has difficulty for sit to stands due to attempting to pull self up with RW instead of pushing requiring Mod assist, fair/good tolerance for ambulation with occasional bumping into objects requiring Min assist and limited mostly due to fatigue.  Patient went back to bed after therapy - RN notified.  Patient will benefit from continued physical therapy in hospital and recommended venue below to increase strength, balance, endurance for safe ADLs and gait.    Follow Up Recommendations  Supervision for mobility/OOB;Supervision/Assistance - 24 hour;SNF     Equipment Recommendations  None recommended by PT    Recommendations for Other Services       Precautions / Restrictions Precautions Precautions: Fall Restrictions Weight Bearing Restrictions: No    Mobility  Bed Mobility Overal bed mobility: Needs Assistance Bed Mobility: Sit to Supine     Supine to sit: Supervision;Min guard     General bed mobility comments: demonstrates slow labored movement with good return for using BUE/LE to reposition self in bed    Transfers Overall transfer level: Needs assistance Equipment used: Rolling walker (2 wheeled) Transfers: Sit to/from [employer] Sit to Stand: Min assist;Mod assist Stand pivot transfers: Mod assist       General transfer comment: good  carryover for getting close to bed before sitting  Ambulation/Gait Ambulation/Gait assistance: Min assist Gait Distance (Feet): 150 Feet Assistive device: Rolling walker (2 wheeled) Gait Pattern/deviations: Decreased step length - right;Decreased step length - left;Decreased stride length;Drifts right/left Gait velocity: decreased   General Gait Details: requires assistance to avoid bumping into nearby objects   Stairs             Wheelchair Mobility    Modified Rankin (Stroke Patients Only)       Balance Overall balance assessment: Needs assistance Sitting-balance support: Feet supported;No upper extremity supported Sitting balance-Leahy Scale: Good Sitting balance - Comments: seated at EOB   Standing balance support: During functional activity;Bilateral upper extremity supported Standing balance-Leahy Scale: Fair Standing balance comment: using RW                            Cognition Arousal/Alertness: Awake/alert Behavior During Therapy: WFL for tasks assessed/performed Overall Cognitive Status: History of cognitive impairments - at baseline                                        Exercises      General Comments        Pertinent Vitals/Pain      Home Living                      Prior Function  PT Goals (current goals can now be found in the care plan section) Acute Rehab PT Goals Patient Stated Goal: not stated PT Goal Formulation: With patient Time For Goal Achievement: 08/17/20    Frequency    Min 2X/week      PT Plan Current plan remains appropriate    Co-evaluation              AM-PAC PT "6 Clicks" Mobility   Outcome Measure  Help needed turning from your back to your side while in a flat bed without using bedrails?: A Little Help needed moving from lying on your back to sitting on the side of a flat bed without using bedrails?: A Little Help needed moving to and from a bed to  a chair (including a wheelchair)?: A Little Help needed standing up from a chair using your arms (e.g., wheelchair or bedside chair)?: A Little Help needed to walk in hospital room?: A Little Help needed climbing 3-5 steps with a railing? : A Lot 6 Click Score: 17    End of Session   Activity Tolerance: Patient tolerated treatment well;Patient limited by fatigue Patient left: in bed;with call bell/phone within reach Nurse Communication: Mobility status PT Visit Diagnosis: Unsteadiness on feet (R26.81);Other abnormalities of gait and mobility (R26.89);Muscle weakness (generalized) (M62.81)     Time: 3151-7616 PT Time Calculation (min) (ACUTE ONLY): 22 min  Charges:  $Therapeutic Activity: 8-22 mins                     3:57 PM, 08/03/20 Ocie Bob, MPT Physical Therapist with [facility] 336 [phone] office [phone] mobile phone

## 2020-08-04 NOTE — ED Notes (Signed)
Pt soiled himself. Pt stood to be changed and placed in new brief/ gown. Offered pt to walk around nursing station, pt declined at this time. Pt laid back down in bed. Pt has new purewick in place. Pt bed lowered and locked with call bell in reach.

## 2020-08-04 NOTE — ED Notes (Signed)
Calm and cooperative

## 2020-08-05 NOTE — ED Notes (Signed)
Pt stood to be changed. Pt soiled brief. Pt cleaned and new brief applied. Encouraged pt to walk, pt refused. encouraged pt to sit up in bed side recliner, pt refused. Pt laid himself back in bed. Bed lowered and locked with call bell in reach.

## 2020-08-05 NOTE — ED Provider Notes (Signed)
Emergency Medicine Observation Re-evaluation Note  Isaac [name] is a 40 y.o. male, seen on rounds today.  Pt initially presented to the ED for complaints of Agitation (Behavioral symptoms) Currently, the patient is TOC placement.  Physical Exam  BP 126/73   Pulse 82   Temp 98.9 F (37.2 C) (Oral)   Resp 17   Ht 6\' 2"  (1.88 m)   Wt 107.5 kg   SpO2 100%   BMI 30.43 kg/m  Physical Exam General: Calm and cooperative. Cardiac: Well perfused.  Lungs: even, unlabored respirations  Psych: Calm   ED Course / MDM  EKG:   I have reviewed the labs performed to date as well as medications administered while in observation.  Recent changes in the last 24 hours include continued TOC placement plan. Patient has been calm and cooperative per nursing.   Plan  Current plan is for TOC palcement.    , MD 08/06/20 [phone]

## 2020-08-05 NOTE — Progress Notes (Signed)
[facility]: Referral sent yesterday to Kym Groom [phone]. CSW has left VM this morning for follow up.   [facility]: VM left.  [facility]: VM left.   [facility]: Currently no beds.   [facility]: Will not accept pt.   CSW spoke to [place] with [facility] who states she would come assess pt again next week. She asked that CSW call on Monday to set up a time.   [facility]: VM left.   [facility]: VM left.

## 2020-08-05 NOTE — NC FL2 (Signed)
[place] MEDICAID FL2 LEVEL OF CARE SCREENING TOOL     IDENTIFICATION  Patient Name: Isaac [name] Birthdate: [birth year] Sex: male Admission Date (Current Location): 06/09/2020  [place] and [place] Number:  [name] [id number] J Facility and Address:  [facility],  [address] [place] [address]      Provider Number: [id number]  Attending Physician Name and Address:  Default, Provider, MD  Relative Name and Phone Number:  Kristine Garbe [phone]    Current Level of Care: Hospital Recommended Level of Care: Assisted Living Facility,Family Care Home,Other (Comment) (Group Home) Prior Approval Number:    Date Approved/Denied:   PASRR Number: [id number]  Discharge Plan: Other (Comment) (FCH, ALF, or GH)    Current Diagnoses: Patient Active Problem List   Diagnosis Date Noted  . 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 Cocaine use disorder (HCC) 02/07/2020  . SVT (supraventricular tachycardia) (HCC) 01/11/2020  . Acute cystitis with hematuria   . Agitation   . Acute urinary tract infection 01/01/2020  . History of bacterial meningitis 01/01/2020  . Abnormal ECG 01/01/2020  . Normocytic anemia 01/01/2020  . Combative behavior 12/31/2019  . Gastrostomy tube in place (HCC) 12/31/2019  . Bacterial meningitis 10/04/2019  . NSTEMI (non-ST elevated myocardial infarction) (HCC) 09/28/2019  . Demand ischemia (HCC) 09/28/2019  . Hypokalemia 09/28/2019    Orientation RESPIRATION BLADDER Height & Weight     Self  Normal Incontinent (Physically able to toilet, but will need prompting) Weight: 236 lb 15.9 oz (107.5 kg) Height:  6\' 2"  (188 cm)  BEHAVIORAL SYMPTOMS/MOOD NEUROLOGICAL BOWEL NUTRITION STATUS      Incontinent Diet (Diet regular Room service appropriate? Yes; Fluid consistency: Thin / Does not tolerate lactose, Does not eat pork)  AMBULATORY STATUS COMMUNICATION OF NEEDS Skin   Supervision Verbally  Normal                       Personal Care Assistance Level of Assistance  Bathing,Feeding,Dressing Bathing Assistance: Limited assistance Feeding assistance: Independent Dressing Assistance: Limited assistance Total Care Assistance: Limited assistance   Functional Limitations Info  Sight,Hearing,Speech Sight Info: Adequate Hearing Info: Adequate Speech Info: Impaired    SPECIAL CARE FACTORS FREQUENCY                       Contractures Contractures Info: Not present    Additional Factors Info  Code Status,Allergies,Psychotropic Code Status Info: Full Allergies Info: Lactose Intolerance (gi) Not Specified    Pork-derived Products Psychotropic Info: Valium, Lamictal, Trileptal, Seroquel,         Current Medications (08/05/2020):  This is the current hospital active medication list Current Facility-Administered Medications  Medication Dose Route Frequency Provider Last Rate Last Admin  . diazepam (VALIUM) tablet 5 mg  5 mg Oral Q12H 10/05/2020, MD   5 mg at 08/05/20 0913  . ferrous sulfate tablet 325 mg  325 mg Oral Q breakfast 10/05/20, MD   325 mg at 08/05/20 0911  . folic acid (FOLVITE) tablet 1 mg  1 mg Oral Daily 10/05/20, MD   1 mg at 08/05/20 0913  . lamoTRIgine (LAMICTAL) tablet 100 mg  100 mg Oral Daily 10/05/20, MD   100 mg at 08/05/20 0911  . metoprolol tartrate (LOPRESSOR) tablet 37.5 mg  37.5 mg Oral BID 10/05/20, MD   37.5 mg at 08/05/20 0912  . ondansetron (ZOFRAN-ODT) disintegrating  tablet 8 mg  8 mg Oral Q8H PRN Mancel Bale, MD   8 mg at 07/20/20 1606  . OXcarbazepine (TRILEPTAL) tablet 150 mg  150 mg Oral BID Terrilee Files, MD   150 mg at 08/05/20 0911  . QUEtiapine (SEROQUEL) tablet 300 mg  300 mg Oral q AM Terrilee Files, MD   300 mg at 08/05/20 0912  . QUEtiapine (SEROQUEL) tablet 400 mg  400 mg Oral QHS Terrilee Files, MD   400 mg at 08/04/20 2129  . senna-docusate (Senokot-S) tablet 1  tablet  1 tablet Oral QHS Derwood Kaplan, MD   1 tablet at 08/04/20 2129  . thiamine tablet 100 mg  100 mg Oral Daily Terrilee Files, MD   100 mg at 08/05/20 8756   Current Outpatient Medications  Medication Sig Dispense Refill  . diazepam (VALIUM) 5 MG tablet Take 1 tablet (5 mg total) by mouth every 12 (twelve) hours. 60 tablet 0  . ferrous sulfate 325 (65 FE) MG tablet Take 1 tablet (325 mg total) by mouth daily with breakfast. 30 tablet 3  . folic acid (FOLVITE) 1 MG tablet Take 1 tablet (1 mg total) by mouth daily. 30 tablet 1  . lamoTRIgine (LAMICTAL) 100 MG tablet Take 1 tablet (100 mg total) by mouth daily. 30 tablet 1  . metoprolol tartrate 37.5 MG TABS Take 37.5 mg by mouth 2 (two) times daily. 60 tablet 2  . OXcarbazepine (TRILEPTAL) 150 MG tablet Take 1 tablet (150 mg total) by mouth 2 (two) times daily. 60 tablet 1  . polyethylene glycol (MIRALAX / GLYCOLAX) 17 g packet Take 17 g by mouth daily. Hold for diarrhea 30 each 1  . QUEtiapine (SEROQUEL) 300 MG tablet Take 1 tablet (300 mg total) by mouth in the morning. 30 tablet 1  . QUEtiapine (SEROQUEL) 400 MG tablet Take 1 tablet (400 mg total) by mouth at bedtime. 30 tablet 1  . senna-docusate (SENOKOT-S) 8.6-50 MG tablet Take 2 tablets by mouth 2 (two) times daily. Hold for diarrhea 60 tablet 1  . thiamine 100 MG tablet Take 1 tablet (100 mg total) by mouth daily. 30 tablet 1  . feeding supplement (ENSURE ENLIVE / ENSURE PLUS) LIQD Take 237 mLs by mouth 2 (two) times daily between meals. (Patient not taking: No sig reported)       Discharge Medications: Please see discharge summary for a list of discharge medications.  Relevant Imaging Results:  Relevant Lab Results:   Additional Information PT SSN: [id number]  [place], [place]

## 2020-08-06 NOTE — [place] FL2 (Signed)
[place] MEDICAID FL2 LEVEL OF CARE SCREENING TOOL     IDENTIFICATION  Patient Name: Isaac [name] Birthdate: 1980-05-14 Sex: male Admission Date (Current Location): 06/09/2020  [place] and [place] Number:  [name] [id number] Facility and Address:  [facility],  [address], [place] [address]      Provider Number: [id number]  Attending Physician Name and Address:  Default, Provider, MD  Relative Name and Phone Number:  Kristine Garbe [phone]    Current Level of Care: Hospital Recommended Level of Care: Skilled Nursing Facility,Nursing Facility Prior Approval Number:    Date Approved/Denied:   PASRR Number: [id number]  Discharge Plan: SNF    Current Diagnoses: Patient Active Problem List   Diagnosis Date Noted  . 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 Cocaine use disorder (HCC) 02/07/2020  . SVT (supraventricular tachycardia) (HCC) 01/11/2020  . Acute cystitis with hematuria   . Agitation   . Acute urinary tract infection 01/01/2020  . History of bacterial meningitis 01/01/2020  . Abnormal ECG 01/01/2020  . Normocytic anemia 01/01/2020  . Combative behavior 12/31/2019  . Gastrostomy tube in place (HCC) 12/31/2019  . Bacterial meningitis 10/04/2019  . NSTEMI (non-ST elevated myocardial infarction) (HCC) 09/28/2019  . Demand ischemia (HCC) 09/28/2019  . Hypokalemia 09/28/2019    Orientation RESPIRATION BLADDER Height & Weight     Self  Normal Incontinent (Physically able to toilet, but will need prompting) Weight: 236 lb 15.9 oz (107.5 kg) Height:  6\' 2"  (188 cm)  BEHAVIORAL SYMPTOMS/MOOD NEUROLOGICAL BOWEL NUTRITION STATUS      Incontinent Diet (Diet regular Room service appropriate? Yes; Fluid consistency: Thin / Does not tolerate lactose, Does not eat pork)  AMBULATORY STATUS COMMUNICATION OF NEEDS Skin   Limited Assist Verbally Normal                       Personal Care  Assistance Level of Assistance  Bathing,Feeding,Dressing Bathing Assistance: Maximum assistance Feeding assistance: Limited assistance Dressing Assistance: Limited assistance Total Care Assistance: Maximum assistance   Functional Limitations Info  Sight,Hearing,Speech Sight Info: Adequate Hearing Info: Adequate Speech Info: Impaired    SPECIAL CARE FACTORS FREQUENCY                       Contractures Contractures Info: Not present    Additional Factors Info  Code Status,Allergies,Psychotropic Code Status Info: Full Allergies Info: Lactose Intolerance (gi) Not Specified Pork-derived Products Psychotropic Info: Valium, Lamictal, Trileptal, Seroquel         Current Medications (08/06/2020):  This is the current hospital active medication list Current Facility-Administered Medications  Medication Dose Route Frequency Provider Last Rate Last Admin  . diazepam (VALIUM) tablet 5 mg  5 mg Oral Q12H 10/06/2020, MD   5 mg at 08/06/20 0929  . ferrous sulfate tablet 325 mg  325 mg Oral Q breakfast 10/06/20, MD   325 mg at 08/06/20 10/06/20  . folic acid (FOLVITE) tablet 1 mg  1 mg Oral Daily 9381, MD   1 mg at 08/06/20 10/06/20  . lamoTRIgine (LAMICTAL) tablet 100 mg  100 mg Oral Daily 0175, MD   100 mg at 08/06/20 10/06/20  . metoprolol tartrate (LOPRESSOR) tablet 37.5 mg  37.5 mg Oral BID 1025, MD   37.5 mg at 08/06/20 0925  . ondansetron (ZOFRAN-ODT) disintegrating tablet 8 mg  8 mg Oral Q8H PRN 10/06/20,  Mechele Collin, MD   8 mg at 07/20/20 1606  . OXcarbazepine (TRILEPTAL) tablet 150 mg  150 mg Oral BID Terrilee Files, MD   150 mg at 08/06/20 0932  . QUEtiapine (SEROQUEL) tablet 300 mg  300 mg Oral q AM Terrilee Files, MD   300 mg at 08/06/20 5956  . QUEtiapine (SEROQUEL) tablet 400 mg  400 mg Oral QHS Terrilee Files, MD   400 mg at 08/05/20 2137  . senna-docusate (Senokot-S) tablet 1 tablet  1 tablet Oral QHS Derwood Kaplan, MD   1  tablet at 08/05/20 2136  . thiamine tablet 100 mg  100 mg Oral Daily Terrilee Files, MD   100 mg at 08/06/20 3875   Current Outpatient Medications  Medication Sig Dispense Refill  . diazepam (VALIUM) 5 MG tablet Take 1 tablet (5 mg total) by mouth every 12 (twelve) hours. 60 tablet 0  . ferrous sulfate 325 (65 FE) MG tablet Take 1 tablet (325 mg total) by mouth daily with breakfast. 30 tablet 3  . folic acid (FOLVITE) 1 MG tablet Take 1 tablet (1 mg total) by mouth daily. 30 tablet 1  . lamoTRIgine (LAMICTAL) 100 MG tablet Take 1 tablet (100 mg total) by mouth daily. 30 tablet 1  . metoprolol tartrate 37.5 MG TABS Take 37.5 mg by mouth 2 (two) times daily. 60 tablet 2  . OXcarbazepine (TRILEPTAL) 150 MG tablet Take 1 tablet (150 mg total) by mouth 2 (two) times daily. 60 tablet 1  . polyethylene glycol (MIRALAX / GLYCOLAX) 17 g packet Take 17 g by mouth daily. Hold for diarrhea 30 each 1  . QUEtiapine (SEROQUEL) 300 MG tablet Take 1 tablet (300 mg total) by mouth in the morning. 30 tablet 1  . QUEtiapine (SEROQUEL) 400 MG tablet Take 1 tablet (400 mg total) by mouth at bedtime. 30 tablet 1  . senna-docusate (SENOKOT-S) 8.6-50 MG tablet Take 2 tablets by mouth 2 (two) times daily. Hold for diarrhea 60 tablet 1  . thiamine 100 MG tablet Take 1 tablet (100 mg total) by mouth daily. 30 tablet 1  . feeding supplement (ENSURE ENLIVE / ENSURE PLUS) LIQD Take 237 mLs by mouth 2 (two) times daily between meals. (Patient not taking: No sig reported)       Discharge Medications: Please see discharge summary for a list of discharge medications.  Relevant Imaging Results:  Relevant Lab Results:   Additional Information PT SSN: [id number]  [place], [place]

## 2020-08-06 NOTE — Progress Notes (Signed)
CSW reached out to [facility] to inquire about admissions process. Does not sound like pt would be a good candidate for facility.  Pts information refaxed to Debbie with [facility] who will review again. TOC to follow.

## 2020-08-06 NOTE — ED Notes (Signed)
Pt. Changed and assisted to the bed.

## 2020-08-06 NOTE — ED Notes (Signed)
Pt VS updated and stable, medicated per MAR. Pt linens and gown changed due to incontinence. Peri-care performed. Returned to bed, drink and ice at bedside. Bed locked and low. Call bell within reach. Will continue to monitor.

## 2020-08-06 NOTE — ED Notes (Signed)
Pt resting comfortably in bed, VSS, primofit reinforced with tape, pt and linens are clean and dry at this time, primofit functioning without complication. Bed locked and low, call bell within reach. Will continue to monitor.

## 2020-08-06 NOTE — Progress Notes (Cosign Needed)
Physical Therapy Treatment Patient Details Name: Isaac [name] MRN: [id number] DOB: [birth year] Today's Date: 08/06/2020    History of Present Illness Jadakiss Barish is a 40 y.o. male.  He has a history of organic brain syndrome.  5 caveat.  He is brought in by EMS for fever and change in mental status.  Apparently he has been more combative at home, lives with mother.  EMS reports a temp of 100.6 although afebrile here.  Patient endorses cough, abdominal pain, dysuria.  He is not able to elaborate on any of those.  Unknown of COVID v    PT Comments    Patient presents in bed and agreeable for therapy.  Patient demonstrates slow labored movement for sitting up at bedside with Min assist, requires increased time to complete sit to stands and transfers, able to ambulate in hallway with slow labored cadence without loss of balance, limited mostly due to fatigue and mild agitation, had to taken back to room in wheelchair.  Patient tolerated sitting up in chair after therapy.  Patient will benefit from continued physical therapy in hospital and recommended venue below to increase strength, balance, endurance for safe ADLs and gait.     Follow Up Recommendations  Supervision for mobility/OOB;Supervision/Assistance - 24 hour;SNF     Equipment Recommendations  None recommended by PT    Recommendations for Other Services       Precautions / Restrictions Precautions Precautions: Fall Restrictions Weight Bearing Restrictions: No    Mobility  Bed Mobility Overal bed mobility: Needs Assistance Bed Mobility: Sit to Supine     Supine to sit: Min assist     General bed mobility comments: increased time, labored movement    Transfers Overall transfer level: Needs assistance Equipment used: Rolling walker (2 wheeled) Transfers: Sit to/from [employer] Sit to Stand: Min assist;Mod assist Stand pivot transfers: Mod assist       General transfer comment: slow labored  movement  Ambulation/Gait Ambulation/Gait assistance: Min assist Gait Distance (Feet): 100 Feet Assistive device: Rolling walker (2 wheeled) Gait Pattern/deviations: Decreased step length - right;Decreased step length - left;Decreased stride length;Drifts right/left Gait velocity: decreased   General Gait Details: requires assistance to avoid bumping into nearby objects   Stairs             Wheelchair Mobility    Modified Rankin (Stroke Patients Only)       Balance Overall balance assessment: Needs assistance Sitting-balance support: Feet supported;No upper extremity supported Sitting balance-Leahy Scale: Good Sitting balance - Comments: seated at EOB   Standing balance support: During functional activity;Bilateral upper extremity supported Standing balance-Leahy Scale: Fair Standing balance comment: using RW                            Cognition Arousal/Alertness: Awake/alert Behavior During Therapy: WFL for tasks assessed/performed;Impulsive;Agitated Overall Cognitive Status: History of cognitive impairments - at baseline                                        Exercises      General Comments        Pertinent Vitals/Pain      Home Living                      Prior Function  PT Goals (current goals can now be found in the care plan section) Acute Rehab PT Goals Patient Stated Goal: not stated PT Goal Formulation: With patient Time For Goal Achievement: 08/17/20 Progress towards PT goals: Progressing toward goals    Frequency    Min 2X/week      PT Plan Current plan remains appropriate    Co-evaluation              AM-PAC PT "6 Clicks" Mobility   Outcome Measure  Help needed turning from your back to your side while in a flat bed without using bedrails?: A Little Help needed moving from lying on your back to sitting on the side of a flat bed without using bedrails?: A Little Help  needed moving to and from a bed to a chair (including a wheelchair)?: A Little Help needed standing up from a chair using your arms (e.g., wheelchair or bedside chair)?: A Little Help needed to walk in hospital room?: A Little Help needed climbing 3-5 steps with a railing? : A Lot 6 Click Score: 17    End of Session   Activity Tolerance: Patient tolerated treatment well;Patient limited by fatigue Patient left: in bed;with call bell/phone within reach;with chair alarm set Nurse Communication: Mobility status PT Visit Diagnosis: Unsteadiness on feet (R26.81);Other abnormalities of gait and mobility (R26.89);Muscle weakness (generalized) (M62.81)     Time: 1610-9604 PT Time Calculation (min) (ACUTE ONLY): 25 min  Charges:  $Gait Training: 8-22 mins $Therapeutic Activity: 8-22 mins                     11:15 AM, 08/06/20 Ocie Bob, MPT Physical Therapist with [facility] 336 [phone] office [phone] mobile phone

## 2020-08-07 NOTE — ED Notes (Signed)
Pt given meal tray.

## 2020-08-07 NOTE — ED Notes (Signed)
Pt cleansed and moved to chair.  Pt unhappy with getting up this morning, and moving to chair, saying "what the f*&$ man".  Linen changed, pt sitting in chair, purwick in place, pt awaiting lunch.

## 2020-08-07 NOTE — ED Notes (Signed)
Pt.s bedding was changed and pt. Was cleaned up.

## 2020-08-07 NOTE — ED Notes (Signed)
Pt ambulated in room.  Pt cleansed, and placed in bed.  Pt given fresh linens and blankets.  Bed in low position and call bell in reach.  Pt is resting comfortable at this time.

## 2020-08-08 NOTE — ED Notes (Signed)
Pt sleeping. 

## 2020-08-08 NOTE — ED Notes (Signed)
Pt assisted back to bed. Soiled diaper changed and new one applied. Pt given clean linens and a warm blanket at this time. Will continue to monitor.

## 2020-08-09 NOTE — ED Notes (Signed)
Got pt up out of chair, walked pt around the department, pt ambulatory with walker and minimal to no RN intervention. Pt ambulatory with a slow gait, pt helped back into bed, pt is dry and has purewick in place. Pt now in bed watching tv, pt R calf does appear to be slightly bigger than the L, md was informed earlier about mother's concern for swollen lower legs.

## 2020-08-09 NOTE — ED Notes (Signed)
Pt sitting up in bed, pt feeding himself, pt smiling and acting kindly

## 2020-08-09 NOTE — ED Notes (Signed)
Pt in bed, pt had large brown bm, cleaned pt, changed bedding, pt able to stand without assistance, pt helped back into bed,

## 2020-08-09 NOTE — ED Notes (Signed)
Pt sitting in chair, mom with pt, mom states that pt's legs seems more swollen to her, md notified.

## 2020-08-09 NOTE — ED Notes (Signed)
Pt allowed staff to check him. Pt in dry brief and malewick in place

## 2020-08-09 NOTE — ED Notes (Signed)
Pt talkative today, pt kind and answering questions appropriately, pt verbalized that he was very happy with his recent hair cut and that he is doing well, states that he will notify this RN of any needs

## 2020-08-09 NOTE — Progress Notes (Signed)
CSW spoke with admissions at [facility], they currently have no LTC Medicaid beds.   CSW made call to [facility], left multiple VM requesting call back. Numbers to cal are Judeth Cornfield [phone] and [place] [phone].   CSW spoke with Debbie in admissions at [facility], she states she will review, she also will reach out to pts legal guardian to find out more information about pt. Debbie may visit pt this week. TOC to follow.
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
## 2020-08-10 NOTE — ED Notes (Signed)
Physical therapy attempted to do therapy with pt. Pt was still tired , so they will come back later.

## 2020-08-10 NOTE — ED Notes (Signed)
Pt slept during the night. Pt has purewick in place. Clean and dry intact

## 2020-08-10 NOTE — ED Notes (Signed)
Pt sleeping at this time. Arouses easily.

## 2020-08-10 NOTE — ED Notes (Signed)
Physical therapy and staff getting pt out of bed and into recliner.

## 2020-08-10 NOTE — ED Notes (Signed)
Physical therapist and nurse walked pt around the ER with a walker.

## 2020-08-10 NOTE — Therapy (Signed)
Physical Therapy Treatment Patient Details Name: Isaac [name] MRN: [id number] DOB: 02/17/1981 Today's Date: 08/10/2020    History of Present Illness Isaac [name] is a 40 y.o. male brought in by EMS for fever and change in mental status.  Apparently he has been more combative at home, lives with mother. PMH: organic brain syndrome.    PT Comments    Pt agreeable to therapy with nursing encouragement. Pt ambulates 150 ft x2 with RW, demonstrates L drifting requiring intermittent assist to straighten gait and clear past obstacles, no LOB and requires seated rest break half way through. Educate pt on pushing from seated surface to rise and returning UEs for controlled lowering with guarded carryover, pt prefers to pull from braced RW to stand. Pt flat, but pleasant throughout session. Will continue to progress acute PT as able.   Follow Up Recommendations  SNF;Supervision for mobility/OOB;Supervision/Assistance - 24 hour     Equipment Recommendations  None recommended by PT    Recommendations for Other Services       Precautions / Restrictions Precautions Precautions: Fall Restrictions Weight Bearing Restrictions: No    Mobility  Bed Mobility   General bed mobility comments: seated EOB with nurse tech upon arrival    Transfers Overall transfer level: Needs assistance Equipment used: Rolling walker (2 wheeled) Transfers: Sit to/from [employer] Sit to Stand: Mod assist Stand pivot transfers: Min assist    General transfer comment: VCs to push from seated surface but pt prefers to pull from braced RW, VCs for positioning in front of chair prior to sitting  Ambulation/Gait Ambulation/Gait assistance: Min assist Gait Distance (Feet): 150 Feet (x2 with seated rest break in between) Assistive device: Rolling walker (2 wheeled) Gait Pattern/deviations: Step-through pattern;Decreased stride length;Drifts right/left;Trunk flexed Gait velocity: decreased   General  Gait Details: VCs and occasional min A to maneuver RW safely past obstacles, drifts L consistently but able to self correct and VCs intermittently, trunk flexed, minimal bil foot clearance and maintains trunk flexed   Stairs             Wheelchair Mobility    Modified Rankin (Stroke Patients Only)       Balance Overall balance assessment: Needs assistance  Standing balance support: During functional activity;Bilateral upper extremity supported Standing balance-Leahy Scale: Poor Standing balance comment: reliant on UE support     Cognition Arousal/Alertness: Awake/alert Behavior During Therapy: WFL for tasks assessed/performed;Flat affect Overall Cognitive Status: History of cognitive impairments - at baseline     Exercises      General Comments        Pertinent Vitals/Pain      Home Living                      Prior Function            PT Goals (current goals can now be found in the care plan section) Acute Rehab PT Goals Patient Stated Goal: agreeable to walk PT Goal Formulation: With patient Time For Goal Achievement: 08/17/20 Potential to Achieve Goals: Fair Progress towards PT goals: Progressing toward goals    Frequency    Min 2X/week      PT Plan Current plan remains appropriate    Co-evaluation              AM-PAC PT "6 Clicks" Mobility   Outcome Measure  Help needed turning from your back to your side while in a flat bed without using bedrails?: A Little Help  needed moving from lying on your back to sitting on the side of a flat bed without using bedrails?: A Little Help needed moving to and from a bed to a chair (including a wheelchair)?: A Little Help needed standing up from a chair using your arms (e.g., wheelchair or bedside chair)?: A Little Help needed to walk in hospital room?: A Little Help needed climbing 3-5 steps with a railing? : A Lot 6 Click Score: 17    End of Session   Activity Tolerance: Patient  tolerated treatment well Patient left: in chair;with chair alarm set;with call bell/phone within reach Nurse Communication: Mobility status PT Visit Diagnosis: Unsteadiness on feet (R26.81);Other abnormalities of gait and mobility (R26.89);Muscle weakness (generalized) (M62.81)     Time: 3419-6222 PT Time Calculation (min) (ACUTE ONLY): 27 min  Charges:  $Gait Training: 8-22 mins $Therapeutic Activity: 8-22 mins                      Tori Pama Roskos PT, DPT 08/10/20, 1:04 PM

## 2020-08-10 NOTE — Progress Notes (Signed)
CSW spoke to admissions at the [facility], they would not accept pt as he is too young. TOC to follow.

## 2020-08-10 NOTE — ED Notes (Signed)
Pt feeding self, nodding his head and answering to yes or no questions appropriately.

## 2020-08-11 NOTE — ED Notes (Signed)
Patient ambulated around ED nurses station with walker and NT X2.

## 2020-08-11 NOTE — Progress Notes (Signed)
CSW spoke to [place] at [facility], she states she has attempted to contact pts mother and asked if CSW could provide number to pts mother. CSW reached out to pts mother and provided her with the number, CSW explained that [facility] would like more information from her. Pts mother was to call Debbie after speaking with CSW. Debbie explained that she will get information to corporate and update CSW when she can.

## 2020-08-11 NOTE — ED Notes (Signed)
Patient resting in recliner with eyes closed. Respirations even and unlabored. NAD noted

## 2020-08-11 NOTE — ED Notes (Signed)
Patient/s bed linen changed. Patient provided with perineum care and brief changed. Patient sat up in recliner with TV turned on and personal belongings within reach. Patient given a sprite

## 2020-08-11 NOTE — ED Notes (Signed)
Report received from [place], [place]. Patient resting in bed with eyes closed. Respirations even and unlabored. Equal chest rise and fall. NAD noted.

## 2020-08-11 NOTE — ED Notes (Signed)
Patient has chair alarm on

## 2020-08-11 NOTE — ED Notes (Signed)
Patient assisted himself from recliner to bed and chair alarm sounded. Patient is confused and needs constant education from staff for fall precautions.

## 2020-08-12 NOTE — ED Notes (Signed)
Report received, care assumed.

## 2020-08-12 NOTE — ED Notes (Signed)
Patient unwilling to get up to chair at this time, will re-evaluate this afternoon.

## 2020-08-12 NOTE — ED Provider Notes (Signed)
Emergency Medicine Observation Re-evaluation Note  Isaac [name] is a 40 y.o. male, seen on rounds today.  Pt initially presented to the ED for complaints of Agitation (Behavioral symptoms) Currently, the patient is resting in his room.  Physical Exam  BP 126/69 (BP Location: Left Arm)   Pulse 93   Temp 97.8 F (36.6 C) (Oral)   Resp 16   Ht 6\' 2"  (1.88 m)   Wt 107.5 kg   SpO2 100%   BMI 30.43 kg/m  Physical Exam General: resting comfortably, NAD Lungs: normal WOB Psych: currently calm and resting  ED Course / MDM  EKG:   I have reviewed the labs performed to date as well as medications administered while in observation.  Recent changes in the last 24 hours include none.  Plan  Current plan is for placement by social work.    , DO 08/12/20 [phone]

## 2020-08-12 NOTE — Progress Notes (Signed)
CSW still in search of placement for pt. CSW spoke with Isaac [name] at [facility] who states [name] is coming to [facility] this afternoon (6/9) to sign some needed documents and provide needed paperwork. Once she has completed this Isaac [name] will be sending pts information to corporate as they have to make final decision for approval or denial. Isaac [name] states there is no set timeline for how long it will take corporate to provide an answer. CSW to inquire daily for updates. TOC to follow.

## 2020-08-12 NOTE — ED Notes (Signed)
Patient completed 100% of breakfast tray.

## 2020-08-12 NOTE — ED Notes (Signed)
Pt placed back into bed using his walker with standby assistance. Pt cleaned and warm blanket given

## 2020-08-12 NOTE — ED Notes (Signed)
Patient ate 100% of lunch meal

## 2020-08-12 NOTE — ED Notes (Signed)
Patient cleaned, up to chair with walker and assistance x 1.

## 2020-08-12 NOTE — ED Notes (Signed)
Patient in chair eating supper, pleasant and cooperative, no complaints.

## 2020-08-13 NOTE — Progress Notes (Signed)
Physical Therapy Treatment Patient Details Name: Isaac [name] MRN: [id number] DOB: December 27, 1980 Today's Date: 08/13/2020    History of Present Illness Isaac [name] is a 40 y.o. male brought in by EMS for fever and change in mental status.  Apparently he has been more combative at home, lives with mother. PMH: organic brain syndrome.    PT Comments    Patient presents up in chair and agreeable for therapy.  Patient demonstrates slow labored cadence with occasional drifting to the right, requires repeated verbal/tactile cueing due to impulsive behavior and becomes agitated when redirected to functional tasks and had to be put in wheelchair to be wheeled back to room.  Patient able to complete a stand pivot transfer with Mod assist to sit in chair due to poor carryover for reaching for armrest of chair.  Patient tolerated staying up in chair after therapy - RN aware.  Patient will benefit from continued physical therapy in hospital and recommended venue below to increase strength, balance, endurance for safe ADLs and gait.     Follow Up Recommendations  SNF;Supervision for mobility/OOB;Supervision/Assistance - 24 hour     Equipment Recommendations  None recommended by PT    Recommendations for Other Services       Precautions / Restrictions      Mobility  Bed Mobility               General bed mobility comments: Patient presents seated in chair (assisted by nursing staff)    Transfers Overall transfer level: Needs assistance Equipment used: Rolling walker (2 wheeled) Transfers: Sit to/from [employer] Transfers Sit to Stand: Mod assist Stand pivot transfers: Mod assist       General transfer comment: increased time, labored movement, attempts to sit whne not close enough to chair  Ambulation/Gait Ambulation/Gait assistance: Min assist Gait Distance (Feet): 80 Feet Assistive device: Rolling walker (2 wheeled) Gait Pattern/deviations: Step-through  pattern;Decreased stride length;Drifts right/left;Trunk flexed Gait velocity: decreased   General Gait Details: slow labored cadence requiring repeated verbal/tactile cueing for safety due to impulsive behavior and agitation   Stairs             Wheelchair Mobility    Modified Rankin (Stroke Patients Only)       Balance Overall balance assessment: Needs assistance Sitting-balance support: Feet supported;No upper extremity supported Sitting balance-Leahy Scale: Good Sitting balance - Comments: seated at EOB   Standing balance support: During functional activity;Bilateral upper extremity supported Standing balance-Leahy Scale: Fair Standing balance comment: using RW                            Cognition Arousal/Alertness: Awake/alert Behavior During Therapy: WFL for tasks assessed/performed;Agitated;Impulsive Overall Cognitive Status: History of cognitive impairments - at baseline                                        Exercises      General Comments        Pertinent Vitals/Pain      Home Living                      Prior Function            PT Goals (current goals can now be found in the care plan section) Acute Rehab PT Goals Patient Stated Goal: agreeable to walk PT Goal Formulation:  With patient Time For Goal Achievement: 08/17/20 Potential to Achieve Goals: Fair Progress towards PT goals: Progressing toward goals    Frequency    Min 2X/week      PT Plan Current plan remains appropriate    Co-evaluation              AM-PAC PT "6 Clicks" Mobility   Outcome Measure  Help needed turning from your back to your side while in a flat bed without using bedrails?: A Little Help needed moving from lying on your back to sitting on the side of a flat bed without using bedrails?: A Little Help needed moving to and from a bed to a chair (including a wheelchair)?: A Little Help needed standing up from a chair  using your arms (e.g., wheelchair or bedside chair)?: A Little Help needed to walk in hospital room?: A Little Help needed climbing 3-5 steps with a railing? : A Lot 6 Click Score: 17    End of Session   Activity Tolerance: Patient tolerated treatment well Patient left: in chair;with call bell/phone within reach Nurse Communication: Mobility status PT Visit Diagnosis: Unsteadiness on feet (R26.81);Other abnormalities of gait and mobility (R26.89);Muscle weakness (generalized) (M62.81)     Time: 9326-7124 PT Time Calculation (min) (ACUTE ONLY): 23 min  Charges:  $Gait Training: 8-22 mins $Therapeutic Activity: 8-22 mins                     3:09 PM, 08/13/20 Isaac [name], MPT Physical Therapist with [facility] [phone] office [phone] mobile phone

## 2020-08-13 NOTE — ED Notes (Signed)
Pt ambulated with PT in hall.  Pt attempted to ambulate into other patient rooms.  Pt brought back to room, cleansed, changed, purwick adjusted and pt in chair awaiting dinner.

## 2020-08-13 NOTE — ED Notes (Signed)
Pt up sitting ate 100% dinner.

## 2020-08-13 NOTE — ED Notes (Signed)
Pt from chair to bed assisted by 2 staff members

## 2020-08-13 NOTE — ED Notes (Signed)
Pt medicated, pt teeth brushed, and face and hands washed.  Pt ate 100% of breakfast.

## 2020-08-13 NOTE — ED Notes (Signed)
Pt ate 100% of dinner tray

## 2020-08-14 NOTE — ED Notes (Signed)
Patient has eaten 100% breakfast, 100% lunch, and 100% dinner

## 2020-08-14 NOTE — ED Notes (Signed)
Patient assisted into the recliner for lunch. Bed linen changed and brief checked. Patient is dry at this time

## 2020-08-14 NOTE — ED Notes (Signed)
Patient resting in bed with eyes closed. Respirations even and unlabored. NAD noted.  

## 2020-08-14 NOTE — ED Notes (Signed)
Patient repositioned in bed for comfort and environmental stimuli decreased.

## 2020-08-14 NOTE — ED Provider Notes (Signed)
Emergency Medicine Observation Re-evaluation Note  Isaac [name] is a 40 y.o. male, seen on rounds today.  Pt initially presented to the ED for complaints of Agitation (Behavioral symptoms) Currently, the patient is resting quietly.  Physical Exam  BP 118/73   Pulse 81   Temp 98.2 F (36.8 C) (Oral)   Resp 18   Ht 6\' 2"  (1.88 m)   Wt 107.5 kg   SpO2 99%   BMI 30.43 kg/m  Physical Exam General: No acute distress Cardiac: Well-perfused Lungs: Nonlabored Psych: Redirectable  ED Course / MDM  EKG:   I have reviewed the labs performed to date as well as medications administered while in observation.  Recent changes in the last 24 hours include none.  Plan  Current plan is for placement.    , MD 08/14/20 [phone]

## 2020-08-14 NOTE — ED Notes (Signed)
Patient assisted back into bed and provided with new gown and brief and perineum care. Lights shut down and patient states he is going to lay in bed.

## 2020-08-15 NOTE — ED Notes (Signed)
Pt slept all during the night without complaints. Pt alert this morning. New purewick placed. Vital signs stable.

## 2020-08-15 NOTE — ED Notes (Signed)
Patient ate 100% of his breakfast, no complaints at this time.

## 2020-08-15 NOTE — ED Notes (Signed)
Got pt moved up in bed ,gave pt breakfast

## 2020-08-15 NOTE — ED Notes (Signed)
Changed out pt's urine canister

## 2020-08-15 NOTE — ED Notes (Signed)
Pt did not want to wake up and take meds.

## 2020-08-15 NOTE — ED Notes (Signed)
@   2.00 change pt complete clothing, bed linen , urine canister. And chair

## 2020-08-16 ENCOUNTER — Encounter (HOSPITAL_COMMUNITY): Payer: Self-pay

## 2020-08-16 NOTE — ED Notes (Signed)
Assisted pt back to bed, pt transferred himself to bed using walker with moderate assistance. Pericare performed. Oral care performed. Face washed and bilateral axilla washed with soap and water, deodorant applied. Pt cooperative and tolerated well.

## 2020-08-16 NOTE — ED Provider Notes (Signed)
Emergency Medicine Observation Re-evaluation Note  Isaac [name] is a 40 y.o. male, seen on rounds today.  Pt initially presented to the ED for complaints of Agitation (Behavioral symptoms) Currently, the patient is watching TV.  Physical Exam  BP 127/74 (BP Location: Left Wrist)   Pulse 85   Temp 98.9 F (37.2 C) (Oral)   Resp 16   Ht 6\' 2"  (1.88 m)   Wt 107.5 kg   SpO2 100%   BMI 30.43 kg/m  Physical Exam General: awake, resting Cardiac: no signs of abnormal perfusion Lungs: normal effort Psych: calm, no agitation/psychosis  ED Course / MDM  EKG:   I have reviewed the labs performed to date as well as medications administered while in observation.  No recent medication changes  Plan  Current plan is for placement   , MD 08/16/20 [phone]

## 2020-08-16 NOTE — ED Notes (Signed)
Patient assisted into recliner at this time. Patient given new gown, perineum care provided with new brief. Patient had large stool cleaned and new male external catheter applied.

## 2020-08-16 NOTE — ED Notes (Signed)
Ate 100% lunch

## 2020-08-16 NOTE — ED Notes (Signed)
Ate 100% of breakfast

## 2020-08-16 NOTE — Progress Notes (Signed)
CSW reached out to [place] with [facility] for update, CSW awaiting to hear back with an update.   CSW reached out to [facility] [place]. Made call to Judeth Cornfield [phone] and [place] [phone], CSW has been unable to reach anyone. CSW has left multiple messages since last week and has received no f/u call back.   TOC to follow.

## 2020-08-17 NOTE — ED Notes (Signed)
Patient bathed and gown changed. Pt now in the bed and has no other needs at thids time

## 2020-08-17 NOTE — Progress Notes (Signed)
CSW spoke to [place] at [place] [facility] who states that there is no update from corporate at this time.  CSW reached out to [employer], CSW left messages again for admissions. CSW also spoke with the SW at [facility] who states that he would send CSW information over to see if he could assist in getting someone to call CSW back. TOC to follow.

## 2020-08-17 NOTE — ED Notes (Signed)
Patient bathed by CNA's, teeth brushed, linens changed, patient up to chair.

## 2020-08-18 MED ORDER — COVID-19 MRNA VAC-TRIS(PFIZER) 30 MCG/0.3ML IM SUSP
0.3000 mL | Freq: Once | INTRAMUSCULAR | Status: AC
  Administered 2020-08-18: 0.3 mL via INTRAMUSCULAR
  Filled 2020-08-18: qty 0.3

## 2020-08-18 NOTE — Progress Notes (Addendum)
CSW made call to Gavin Pound [phone] with [employer] about sending pt referral over. Referral has been sent and CSW will await answer.   CSW made call to Cala Bradford [phone] with [employer] [facility] about sending pt referral over. Referral has been sent and CSW will await answer.   CSW spoke with pts mother/legal guardian to inform that pt will be getting the first dose of the COVID vaccine today. CSW also explained that finding a bed for pt has been difficult and made her aware that CSW has been reaching out to facilities across [facility], she is understanding that pt will have to go to where there is a bed offer.

## 2020-08-18 NOTE — ED Provider Notes (Signed)
Pt has been waiting for placement since early April.  He is calm and cooperative now and is watching TV.  No complaints.  Guardian has agreed to the Covid vaccine, so I have ordered his first dose of [employer] vaccine.   Jacalyn Lefevre, MD 08/18/20 [phone]

## 2020-08-18 NOTE — ED Notes (Signed)
Provided patient with crackers as requested. Sitting up in recliner watching Tv. Denies any further needs

## 2020-08-18 NOTE — ED Notes (Signed)
Patient in geri chair was incontinent of bowel and bladder. Patient cleaned and in bed at this time.

## 2020-08-18 NOTE — Progress Notes (Signed)
Physical Therapy Treatment Patient Details Name: Isaac [name] MRN: [id number] DOB: 12-08-1980 Today's Date: 08/18/2020    History of Present Illness Isaac [name] is a 40 y.o. male brought in by EMS for fever and change in mental status.  Apparently he has been more combative at home, lives with mother. PMH: organic brain syndrome.    PT Comments    Patient presents in bed and agreeable for therapy.  Patient continues to pull up on RW instead of pushing down during sit to stands requiring therapist to hold walker to prevent falling backwards, once on feet patient demonstrates fair/good standing balance, able to ambulate in room and hallway without loss of balance, but limited mostly due to attempting to go out side of ED.  Patient became slightly agitated and tolerated staying up in chair after therapy - nursing staff aware.  Patient will benefit from continued physical therapy in hospital and recommended venue below to increase strength, balance, endurance for safe ADLs and gait.     Follow Up Recommendations  SNF;Supervision for mobility/OOB;Supervision/Assistance - 24 hour     Equipment Recommendations  None recommended by PT    Recommendations for Other Services       Precautions / Restrictions Precautions Precautions: Fall Restrictions Weight Bearing Restrictions: No    Mobility  Bed Mobility Overal bed mobility: Needs Assistance Bed Mobility: Sit to Supine     Supine to sit: Min assist;Min guard     General bed mobility comments: slow labored movement    Transfers Overall transfer level: Needs assistance Equipment used: Rolling walker (2 wheeled) Transfers: Sit to/from [employer] Sit to Stand: Mod assist Stand pivot transfers: Mod assist       General transfer comment: slow labored movement, tends to pull self up with RW instead of pushing down requiring therapist to hold RW to avoid falling backwards  Ambulation/Gait Ambulation/Gait  assistance: Min assist Gait Distance (Feet): 80 Feet Assistive device: Rolling walker (2 wheeled) Gait Pattern/deviations: Step-through pattern;Decreased stride length;Drifts right/left;Trunk flexed Gait velocity: decreased   General Gait Details: slow labored cadence requiring repeated verbal/tactile cueing for safety due to impulsive behavior and agitation   Stairs             Wheelchair Mobility    Modified Rankin (Stroke Patients Only)       Balance Overall balance assessment: Needs assistance Sitting-balance support: Feet supported;No upper extremity supported Sitting balance-Leahy Scale: Good     Standing balance support: During functional activity;Bilateral upper extremity supported Standing balance-Leahy Scale: Fair Standing balance comment: using RW                            Cognition Arousal/Alertness: Awake/alert Behavior During Therapy: WFL for tasks assessed/performed;Agitated;Impulsive Overall Cognitive Status: History of cognitive impairments - at baseline                                        Exercises      General Comments        Pertinent Vitals/Pain Pain Assessment: No/denies pain    Home Living                      Prior Function            PT Goals (current goals can now be found in the care plan section) Acute Rehab PT  Goals Patient Stated Goal: agreeable to walk PT Goal Formulation: With patient Time For Goal Achievement: 09/01/20 Potential to Achieve Goals: Fair Progress towards PT goals: Progressing toward goals    Frequency    Min 2X/week      PT Plan Current plan remains appropriate    Co-evaluation              AM-PAC PT "6 Clicks" Mobility   Outcome Measure  Help needed turning from your back to your side while in a flat bed without using bedrails?: A Little Help needed moving from lying on your back to sitting on the side of a flat bed without using bedrails?: A  Little Help needed moving to and from a bed to a chair (including a wheelchair)?: A Little Help needed standing up from a chair using your arms (e.g., wheelchair or bedside chair)?: A Little Help needed to walk in hospital room?: A Little Help needed climbing 3-5 steps with a railing? : A Lot 6 Click Score: 17    End of Session   Activity Tolerance: Patient tolerated treatment well Patient left: in chair;with call bell/phone within reach;with chair alarm set Nurse Communication: Mobility status PT Visit Diagnosis: Unsteadiness on feet (R26.81);Other abnormalities of gait and mobility (R26.89);Muscle weakness (generalized) (M62.81)     Time: 0160-1093 PT Time Calculation (min) (ACUTE ONLY): 25 min  Charges:  $Gait Training: 8-22 mins $Therapeutic Activity: 8-22 mins                    3:42 PM, 08/18/20 Ocie Bob, MPT Physical Therapist with [facility] 336 [phone] office [phone] mobile phone

## 2020-08-19 NOTE — ED Notes (Signed)
Carried pt a snack and sprite until lunch arrives

## 2020-08-19 NOTE — ED Notes (Signed)
Changed out urine canister ?

## 2020-08-19 NOTE — ED Notes (Addendum)
Pt with incontinence  of urine and stool, pt cleaned and bed pad changed.

## 2020-08-19 NOTE — ED Notes (Signed)
NT LM changed out urine canister

## 2020-08-20 NOTE — ED Notes (Signed)
Me , nt Efraim Kaufmann and jazz help him pt and changed all linen and applied new malewick

## 2020-08-20 NOTE — Progress Notes (Signed)
Physical Therapy Treatment Patient Details Name: Isaac [name] MRN: [id number] DOB: 10-10-1980 Today's Date: 08/20/2020    History of Present Illness Isaac [name] is a 40 y.o. male brought in by EMS for fever and change in mental status.  Apparently he has been more combative at home, lives with mother. PMH: organic brain syndrome.    PT Comments    Patient presents in bed and agreeable for therapy.  Patient demonstrates slow labored movement for sitting up at bedside with frequent rest breaks and requiring Min/mod assist for pulling self to sitting, poor carryover for attempting BLE exercises with verbal cueing and demonstration, requires RW held during sit to stands due to patient pulling self up with RW and ambulated in hallway without loss of balance.  Patient tolerated sitting up in chair after therapy - RN aware.  Patient will benefit from continued physical therapy in hospital and recommended venue below to increase strength, balance, endurance for safe ADLs and gait.      Follow Up Recommendations  SNF;Supervision for mobility/OOB;Supervision/Assistance - 24 hour     Equipment Recommendations  None recommended by PT    Recommendations for Other Services       Precautions / Restrictions Precautions Precautions: Fall Restrictions Weight Bearing Restrictions: No    Mobility  Bed Mobility Overal bed mobility: Needs Assistance Bed Mobility: Supine to Sit     Supine to sit: Min assist;Mod assist     General bed mobility comments: increased time, labored movement, required Min/mod assist to to pull self to sitting with frequent rest breaks    Transfers Overall transfer level: Needs assistance Equipment used: Rolling walker (2 wheeled) Transfers: Sit to/from [employer] Sit to Stand: Mod assist Stand pivot transfers: Mod assist       General transfer comment: slow labored movement, tends to pull self up with RW instead of pushing down requiring  therapist to hold RW to avoid falling backwards  Ambulation/Gait Ambulation/Gait assistance: Min assist Gait Distance (Feet): 90 Feet Assistive device: Rolling walker (2 wheeled) Gait Pattern/deviations: Step-through pattern;Decreased stride length;Drifts right/left;Trunk flexed Gait velocity: decreased   General Gait Details: slow labored cadence without loss of balance, occasional bumping into nearby objects   Stairs             Wheelchair Mobility    Modified Rankin (Stroke Patients Only)       Balance Overall balance assessment: Needs assistance Sitting-balance support: Feet supported;No upper extremity supported Sitting balance-Leahy Scale: Good Sitting balance - Comments: seated at EOB   Standing balance support: During functional activity;Bilateral upper extremity supported Standing balance-Leahy Scale: Fair Standing balance comment: using RW                            Cognition Arousal/Alertness: Awake/alert Behavior During Therapy: WFL for tasks assessed/performed;Agitated;Impulsive Overall Cognitive Status: History of cognitive impairments - at baseline                                        Exercises      General Comments        Pertinent Vitals/Pain Pain Assessment: No/denies pain    Home Living                      Prior Function            PT Goals (  current goals can now be found in the care plan section) Acute Rehab PT Goals Patient Stated Goal: agreeable to walk PT Goal Formulation: With patient Time For Goal Achievement: 09/01/20 Potential to Achieve Goals: Fair Progress towards PT goals: Progressing toward goals    Frequency    Min 2X/week      PT Plan Current plan remains appropriate    Co-evaluation              AM-PAC PT "6 Clicks" Mobility   Outcome Measure  Help needed turning from your back to your side while in a flat bed without using bedrails?: A Little Help needed  moving from lying on your back to sitting on the side of a flat bed without using bedrails?: A Little Help needed moving to and from a bed to a chair (including a wheelchair)?: A Little Help needed standing up from a chair using your arms (e.g., wheelchair or bedside chair)?: A Little Help needed to walk in hospital room?: A Little Help needed climbing 3-5 steps with a railing? : A Lot 6 Click Score: 17    End of Session   Activity Tolerance: Patient tolerated treatment well Patient left: in chair;with call bell/phone within reach;with chair alarm set Nurse Communication: Mobility status PT Visit Diagnosis: Unsteadiness on feet (R26.81);Other abnormalities of gait and mobility (R26.89);Muscle weakness (generalized) (M62.81)     Time: 3419-3790 PT Time Calculation (min) (ACUTE ONLY): 24 min  Charges:  $Gait Training: 8-22 mins $Therapeutic Activity: 8-22 mins                     11:09 AM, 08/20/20 Ocie Bob, MPT Physical Therapist with [facility] [phone] office [phone] mobile phone

## 2020-08-20 NOTE — ED Notes (Signed)
Pt transferred from chair to bed with walker and 1-staff assist. Pt provided warm blanket and made comfortable. Male purewick in place at this time.

## 2020-08-20 NOTE — ED Notes (Signed)
Pt walking in the hall with PT

## 2020-08-20 NOTE — ED Notes (Signed)
Pt up eating breakfast  

## 2020-08-21 NOTE — ED Notes (Signed)
Offered to get pt up to wheelchair and go outside; pt refused and did not want to get up

## 2020-08-21 NOTE — ED Notes (Signed)
Pt cleaned, new purewick in place. Bed linens changed and new gown provided. Pt agreeable to getting up to chair. Visitor at bedside.

## 2020-08-21 NOTE — ED Notes (Signed)
Pt allowed vital signs to be taken at this time, however, still refusing to get out of bed.

## 2020-08-21 NOTE — ED Notes (Signed)
Pt is resting comfortably with even respirations.

## 2020-08-21 NOTE — ED Notes (Signed)
Pt refused vital signs.

## 2020-08-22 NOTE — ED Notes (Signed)
Pt is sitting up in the chair comfortably.  

## 2020-08-23 NOTE — ED Notes (Signed)
Mother visiting, pt up to chair. Pleasant and cooperative with care. Has wanted to remain in bed most of the day until mother arrived.

## 2020-08-23 NOTE — Progress Notes (Signed)
CSW reached out to [place] at [place], she is still waiting for an update from corporate. CSW made call to corporate number, left vm for corporate business offices. CSW spoke with Marylene Land in the corporate office for [facility] facilities. She states she is still awaiting some needed documents for review. CSW provided Marylene Land with pts mothers name and number so she could reach out for further information about financials for pt.   CSW spoke to Gavin Pound with [employer], she will review referral sent last weekend and let CSW know.   CSW spoke to Clydie Braun with [facility] who states that she will review pts referral when she gets to the facility. Clydie Braun states that she is unable to make a bed offer for pt.   CSW made referral for pt to [facility] to see if they would be able to assist in finding services for pt.

## 2020-08-23 NOTE — ED Notes (Signed)
Pt checked. Male-wick in place with dry brief. Vitals obtained

## 2020-08-24 LAB — URINALYSIS, ROUTINE W REFLEX MICROSCOPIC
Bilirubin Urine: NEGATIVE
Glucose, UA: NEGATIVE mg/dL
Hgb urine dipstick: NEGATIVE
Ketones, ur: NEGATIVE mg/dL
Leukocytes,Ua: NEGATIVE
Nitrite: NEGATIVE
Protein, ur: NEGATIVE mg/dL
Specific Gravity, Urine: 1.008 (ref 1.005–1.030)
pH: 6 (ref 5.0–8.0)

## 2020-08-24 MED ORDER — ACETAMINOPHEN 325 MG PO TABS
650.0000 mg | ORAL_TABLET | Freq: Once | ORAL | Status: AC
  Administered 2020-08-24: 650 mg via ORAL

## 2020-08-24 MED ORDER — TUBERCULIN PPD 5 UNIT/0.1ML ID SOLN
5.0000 [IU] | INTRADERMAL | Status: AC
  Administered 2020-08-24: 5 [IU] via INTRADERMAL
  Filled 2020-08-24: qty 0.1

## 2020-08-24 NOTE — ED Notes (Signed)
EDP notified of change in HR. Orders for EKG given.

## 2020-08-24 NOTE — ED Notes (Addendum)
TB skin test given at this time with mom and staff assistance. Pt tolerated well.   TB test placed in R forearm and to be read at 1645 on 6/23

## 2020-08-24 NOTE — ED Notes (Signed)
Pt got up with walker with little assistance and got into bed with directions.

## 2020-08-24 NOTE — ED Notes (Signed)
Pt checked and changed clean brief applied.Malewick in place

## 2020-08-24 NOTE — ED Provider Notes (Signed)
Pt continues to wait for placement.  He has been calm today.  SW requests TB test, so that will be placed today.  He will need it read in 2 days (6/23).  1st Covid vaccine administered on 6/15.  He will need the 2nd dose on 7/6.    Jacalyn Lefevre, MD 08/24/20 1459

## 2020-08-24 NOTE — ED Notes (Signed)
Pt refusing TB skin test at this time.

## 2020-08-24 NOTE — Progress Notes (Signed)
CSW spoke with Isaac [name] and provided him with pts referral packet. Mr. Isaac [name] states he has a male bed available and would make a bed offer on pt. CSW was informed that pt would need a TB skin test completed and resulted before pt could come to facility. CSW asked that attending place order for TB skin test to be completed. CSW provided Mr. Isaac [name] with pts medication list. Mr. Isaac [name] states pts mother will have to sign over payee rights to him so he can handle the financial aspects. CSW spoke with [name] about possibly having a bed offer for pt. CSW also informed her of the need to sign over payee rights, Ms. Isaac [name] is understanding and agreeable to this. CSW to follow for TB skin test results and work on finalizing everything for possible transition to [facility] at the end of this week.

## 2020-08-25 LAB — RESP PANEL BY RT-PCR (FLU A&B, COVID) ARPGX2
Influenza A by PCR: NEGATIVE
Influenza B by PCR: NEGATIVE
SARS Coronavirus 2 by RT PCR: NEGATIVE

## 2020-08-25 NOTE — ED Notes (Signed)
Pt sitting in chair watching TV 

## 2020-08-25 NOTE — ED Notes (Signed)
Patient is resting comfortably.  Sitting in chair

## 2020-08-25 NOTE — ED Notes (Signed)
Patient is resting comfortably. Watching baseball not wanting to go to bed at this time.  Will continue to monitor.

## 2020-08-25 NOTE — ED Notes (Signed)
Pt in chair sleeping at this time.

## 2020-08-25 NOTE — Progress Notes (Addendum)
PT Cancellation Note  Patient Details Name: Isaac [name] MRN: [id number] DOB: 1980-09-29   Cancelled Treatment:    Reason Eval/Treat Not Completed: Patient declined, no reason specified.  Patient declined therapy in AM due to eating snacks, will check back if time permits.  Patient also declined in PM.   12:35 PM, 08/25/20 Ocie Bob, MPT Physical Therapist with [facility] [phone] office [phone] mobile phone

## 2020-08-25 NOTE — ED Provider Notes (Signed)
Patient awake and calm.  Patient is awaiting transfer to [facility] the end of the week.  TB test will be read on June 23   Bethann Berkshire, MD 08/25/20 [phone]

## 2020-08-25 NOTE — ED Notes (Signed)
Pt moved from bed to chair at this time.

## 2020-08-26 MED ORDER — QUETIAPINE FUMARATE 300 MG PO TABS: 300.0000 mg | ORAL_TABLET | Freq: Every morning | ORAL | 0 refills | Status: AC

## 2020-08-26 MED ORDER — METOPROLOL TARTRATE 37.5 MG PO TABS
37.5000 mg | ORAL_TABLET | Freq: Two times a day (BID) | ORAL | 0 refills | Status: AC
Start: 2020-08-26 — End: ?

## 2020-08-26 MED ORDER — FOLIC ACID 1 MG PO TABS: 1.0000 mg | ORAL_TABLET | Freq: Every day | ORAL | 0 refills | Status: AC

## 2020-08-26 MED ORDER — THIAMINE HCL 100 MG PO TABS
100.0000 mg | ORAL_TABLET | Freq: Every day | ORAL | 0 refills | Status: AC
Start: 2020-08-26 — End: ?

## 2020-08-26 MED ORDER — QUETIAPINE FUMARATE 400 MG PO TABS: 400.0000 mg | ORAL_TABLET | Freq: Every day | ORAL | 0 refills | Status: AC

## 2020-08-26 MED ORDER — LAMOTRIGINE 100 MG PO TABS: 100.0000 mg | ORAL_TABLET | Freq: Every day | ORAL | 0 refills | Status: AC

## 2020-08-26 MED ORDER — FERROUS SULFATE 325 (65 FE) MG PO TABS: 325.0000 mg | ORAL_TABLET | Freq: Every day | ORAL | 0 refills | Status: AC

## 2020-08-26 MED ORDER — OXCARBAZEPINE 150 MG PO TABS: 150.0000 mg | ORAL_TABLET | Freq: Two times a day (BID) | ORAL | 0 refills | Status: AC

## 2020-08-26 MED ORDER — DIAZEPAM 5 MG PO TABS: 5.0000 mg | ORAL_TABLET | Freq: Two times a day (BID) | ORAL | 0 refills | Status: AC

## 2020-08-26 MED ORDER — SENNOSIDES-DOCUSATE SODIUM 8.6-50 MG PO TABS: 2.0000 | ORAL_TABLET | Freq: Every day | ORAL | 0 refills | Status: AC

## 2020-08-26 NOTE — ED Notes (Signed)
Pt has fallen back to sleep since breakfast.

## 2020-08-26 NOTE — ED Notes (Signed)
Pt alert and smiling at nurse, v/s checked and room straightened up.

## 2020-08-26 NOTE — ED Notes (Signed)
Pt resting quietly in bed, VS updated and stable. Pt brief and linens dry. Urine output documented. Pt denies further needs at this time. Will continue to monitor.

## 2020-08-26 NOTE — ED Notes (Signed)
Right Forearm read for TB skin test. Negative and 0 mm.

## 2020-08-26 NOTE — Progress Notes (Addendum)
Physical Therapy Treatment Patient Details Name: Isaac [name] MRN: [id number] DOB: [birth year] Today's Date: 08/26/2020    History of Present Illness Isaac [name] is a 40 y.o. male brought in by EMS for fever and change in mental status.  Apparently he has been more combative at home, lives with mother. PMH: organic brain syndrome.    PT Comments    Patient presents in bed and agreeable to therapy. Patient was min assist for bed mobility with verbal cues. Patient demonstrated good seated balance at the EOB. Patient required mod assist for sit to stand and was able to ambulate using a RW for 80 feet. Patient demonstrated good return transferring to bed and chair after therapy. Patient will benefit from continued physical therapy in hospital and recommended venue below to increase strength, balance, endurance for safe ADLs and gait.     Follow Up Recommendations  SNF;Supervision for mobility/OOB;Supervision/Assistance - 24 hour     Equipment Recommendations  None recommended by PT    Recommendations for Other Services       Precautions / Restrictions Precautions Precautions: Fall Restrictions Weight Bearing Restrictions: No    Mobility  Bed Mobility Overal bed mobility: Needs Assistance Bed Mobility: Supine to Sit     Supine to sit: Min assist;Min guard     General bed mobility comments: increased time, labored movements and verbal cues    Transfers Overall transfer level: Needs assistance Equipment used: Rolling walker (2 wheeled) Transfers: Sit to/from Stand Sit to Stand: Mod assist Stand pivot transfers: Mod assist       General transfer comment: slow labored movement, tends to pull self up with RW instead of pushing down requiring therapist to hold RW to avoid falling backwards  Ambulation/Gait Ambulation/Gait assistance: Min assist Gait Distance (Feet): 80 Feet Assistive device: Rolling walker (2 wheeled) Gait Pattern/deviations: Step-through  pattern;Decreased stride length;Drifts right/left;Trunk flexed Gait velocity: decreased   General Gait Details: slow labored cadence without loss of balance, occasional bumping into nearby objects   Stairs             Wheelchair Mobility    Modified Rankin (Stroke Patients Only)       Balance Overall balance assessment: Needs assistance Sitting-balance support: Feet supported;No upper extremity supported Sitting balance-Leahy Scale: Good Sitting balance - Comments: seated at EOB   Standing balance support: During functional activity;Bilateral upper extremity supported Standing balance-Leahy Scale: Fair Standing balance comment: using RW                            Cognition Arousal/Alertness: Awake/alert Behavior During Therapy: WFL for tasks assessed/performed;Agitated;Impulsive Overall Cognitive Status: History of cognitive impairments - at baseline                                        Exercises      General Comments        Pertinent Vitals/Pain Pain Assessment: No/denies pain    Home Living                      Prior Function            PT Goals (current goals can now be found in the care plan section) Acute Rehab PT Goals Patient Stated Goal: agreeable to walk PT Goal Formulation: With patient Time For Goal Achievement: 09/01/20 Potential to Achieve Goals: Fair  Progress towards PT goals: Progressing toward goals    Frequency    Min 2X/week      PT Plan Current plan remains appropriate    Co-evaluation              AM-PAC PT "6 Clicks" Mobility   Outcome Measure  Help needed turning from your back to your side while in a flat bed without using bedrails?: A Little Help needed moving from lying on your back to sitting on the side of a flat bed without using bedrails?: A Little Help needed moving to and from a bed to a chair (including a wheelchair)?: A Little Help needed standing up from a  chair using your arms (e.g., wheelchair or bedside chair)?: A Little Help needed to walk in hospital room?: A Little Help needed climbing 3-5 steps with a railing? : A Lot 6 Click Score: 17    End of Session   Activity Tolerance: Patient tolerated treatment well Patient left: in chair;with call bell/phone within reach;with chair alarm set Nurse Communication: Mobility status PT Visit Diagnosis: Unsteadiness on feet (R26.81);Other abnormalities of gait and mobility (R26.89);Muscle weakness (generalized) (M62.81)     Time: 9702-6378 PT Time Calculation (min) (ACUTE ONLY): 22 min  Charges:  $Gait Training: 8-22 mins $Therapeutic Activity: 8-22 mins                    3:47 PM, 08/26/20 Fayrene Fearing Cousler SPT  3:47 PM, 08/26/20 Ocie Bob, MPT Physical Therapist with [facility] [phone] office [phone] mobile phone

## 2020-08-26 NOTE — ED Notes (Signed)
Pt awake and feeding self breakfast. Took meds whole without any issues.

## 2020-08-26 NOTE — Progress Notes (Addendum)
CSW spoke with [name] to update that TB test will be read this afternoon. He requested CSW call tomorrow at 10am to make sure everything is lined up for placement at [facility]. Plan is tentatively for placement Friday afternoon. CSW updated pts mother that she will need to go to the facility to sign paperwork as she is the legal guardian. CSW spoke with Kristine Garbe who is agreeable and understanding that she will need to go to the facility. TOC to follow.    Addendum 3:54pm: CSW spoke with [name] to inquire what would make transition to his [facility] smoother. He would like for all of pts current medications to be sent to [facility] in [place]. He states this is the pharmacy they use, he asked that I speak with Lupita Leash at [phone]. CSW spoke with ED Attending and asked that escripts for all of pts meds be sent to [facility] to assist in a smoother transition to [facility] tomorrow. Also, CSW spoke with Lupita Leash, she is aware that meds will be sent over. CSW provided her with pts insurance info, name, DOB, and allergies so she could create pt file. She will be awaiting pts prescriptions so she can get his meds prepared for Mr. Wyline Mood.

## 2020-08-27 NOTE — ED Provider Notes (Signed)
Per Social Work, patient is ready for discharge to [facility]. FL2 has been completed. Patient remains at baseline. No new complaints today.    Pollyann Savoy, MD 08/27/20 1041

## 2020-08-27 NOTE — NC FL2 (Signed)
[place] MEDICAID FL2 LEVEL OF CARE SCREENING TOOL     IDENTIFICATION  Patient Name: Isaac [name] Birthdate: 1980/08/22 Sex: male Admission Date (Current Location): 06/09/2020  [place] and [place] Number:  [name] [id number] J Facility and Address:  [facility],  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   Cognitive deficit due to recent stroke 03/03/2020   Cocaine use disorder (HCC) 02/07/2020   SVT (supraventricular tachycardia) (HCC) 01/11/2020   Acute cystitis with hematuria    Agitation    Acute urinary tract infection 01/01/2020   History of bacterial meningitis 01/01/2020   Abnormal ECG 01/01/2020   Normocytic anemia 01/01/2020   Combative behavior 12/31/2019   Gastrostomy tube in place (HCC) 12/31/2019   Bacterial meningitis 10/04/2019   NSTEMI (non-ST elevated myocardial infarction) (HCC) 09/28/2019   Demand ischemia (HCC) 09/28/2019   Hypokalemia 09/28/2019    Orientation RESPIRATION BLADDER Height & Weight     Self  Normal Incontinent Weight: 236 lb 15.9 oz (107.5 kg) Height:  6\' 2"  (188 cm)  BEHAVIORAL SYMPTOMS/MOOD NEUROLOGICAL BOWEL NUTRITION STATUS      Incontinent Diet (Diet regular Room service appropriate? Yes; Fluid consistency: Thin / Does not tolerate lactose, Does not eat pork)  AMBULATORY STATUS COMMUNICATION OF NEEDS Skin   Extensive Assist Verbally Normal                       Personal Care Assistance Level of Assistance   Bathing, Feeding, Dressing Bathing Assistance: Maximum assistance Feeding assistance: Limited assistance Dressing Assistance: Limited assistance Total Care Assistance: Maximum assistance   Functional Limitations Info  Sight, Hearing, Speech Sight Info: Adequate Hearing Info: Adequate Speech Info: Impaired    SPECIAL CARE FACTORS FREQUENCY                       Contractures Contractures Info: Not present    Additional Factors Info  Code Status, Allergies, Psychotropic Code Status Info: FULL Allergies Info: Lactose Intolerance (gi) Not Specified Pork-derived Products Psychotropic Info: Valium, Lamictal, Trileptal, Seroquel         Current Medications (08/27/2020):  This is the current hospital active medication list Current Facility-Administered Medications  Medication Dose Route Frequency Provider Last Rate Last Admin   diazepam (VALIUM) tablet 5 mg  5 mg Oral Q12H 08/29/2020, MD   5 mg at 08/26/20 2122   ferrous sulfate tablet 325 mg  325 mg Oral Q breakfast 2123, MD   325 mg at 08/26/20 0912   folic acid (FOLVITE) tablet 1 mg  1 mg Oral Daily 08/28/20, MD   1 mg at 08/26/20 0911   lamoTRIgine (LAMICTAL) tablet 100 mg  100 mg Oral Daily 08/28/20, MD   100 mg at 08/26/20 0912   metoprolol tartrate (LOPRESSOR) tablet 37.5 mg  37.5 mg Oral BID 08/28/20, MD   37.5 mg at 08/26/20 2122   ondansetron (ZOFRAN-ODT) disintegrating tablet 8 mg  8 mg Oral Q8H PRN Mancel Bale, MD   8 mg at 08/07/20 1016   OXcarbazepine (TRILEPTAL) tablet 150 mg  150 mg Oral BID Terrilee Files, MD   150 mg at 08/26/20 2121   QUEtiapine (SEROQUEL) tablet 300 mg  300 mg Oral q AM Terrilee Files, MD   300 mg at 08/26/20 0911   QUEtiapine (SEROQUEL) tablet 400 mg  400 mg Oral QHS Terrilee Files, MD   400 mg at 08/26/20 2121   senna-docusate (Senokot-S) tablet 1 tablet  1 tablet Oral QHS Derwood Kaplan, MD   1 tablet at 08/26/20 2122   thiamine  tablet 100 mg  100 mg Oral Daily Terrilee Files, MD   100 mg at 08/26/20 0539   Current Outpatient Medications  Medication Sig Dispense Refill   polyethylene glycol (MIRALAX / GLYCOLAX) 17 g packet Take 17 g by mouth daily. Hold for diarrhea 30 each 1   diazepam (VALIUM) 5 MG tablet Take 1 tablet (5 mg total) by mouth every 12 (twelve) hours. 60 tablet 0   feeding supplement (ENSURE ENLIVE / ENSURE PLUS) LIQD Take 237 mLs by mouth 2 (two) times daily between meals. (Patient not taking: No sig reported)     ferrous sulfate 325 (65 FE) MG tablet Take 1 tablet (325 mg total) by mouth daily with breakfast. 30 tablet 0   folic acid (FOLVITE) 1 MG tablet Take 1 tablet (1 mg total) by mouth daily. 30 tablet 0   lamoTRIgine (LAMICTAL) 100 MG tablet Take 1 tablet (100 mg total) by mouth daily. 30 tablet 0   Metoprolol Tartrate 37.5 MG TABS Take 37.5 mg by mouth 2 (two) times daily. 60 tablet 0   OXcarbazepine (TRILEPTAL) 150 MG tablet Take 1 tablet (150 mg total) by mouth 2 (two) times daily. 60 tablet 0   QUEtiapine (SEROQUEL) 300 MG tablet Take 1 tablet (300 mg total) by mouth in the morning. 30 tablet 0   QUEtiapine (SEROQUEL) 400 MG tablet Take 1 tablet (400 mg total) by mouth at bedtime. 30 tablet 0   senna-docusate (SENOKOT-S) 8.6-50 MG tablet Take 2 tablets by mouth at bedtime. Hold for diarrhea 20 tablet 0   thiamine 100 MG tablet Take 1 tablet (100 mg total) by mouth daily. 30 tablet 0     Discharge Medications: Please see discharge summary for a list of discharge medications.  Relevant Imaging Results:  Relevant Lab Results:   Additional Information PT SSN: [id number]  [place], [place]

## 2020-08-27 NOTE — ED Notes (Signed)
Pt taken out by [facility] rescue on stretcher.

## 2020-08-27 NOTE — Progress Notes (Signed)
CSW spoke to [place] with [facility] who confirmed the prescriptions were received. CSW spoke with [name] to inform that TB and COVID were negative. CSW also informed him that the medications were sent to the pharmacy. Per Mr. Isaac [name] pt can be discharged to their facility at [facility] [address] [place] whenever we are ready. Mr. Isaac [name] requests CSW fax new fl2 with medications, CSW completed and faxed Mr. Isaac [name] signed and updated fl2, COVID results, and TB results. CSW provided pts RN with hard copies of pts fl2, covid and tb results to put into the d/c packet. CSW updated ED staff and they will call for transport.   CSW updated pts mother that pt will be discharging to [facility] today.

## 2020-08-27 NOTE — NC FL2 (Addendum)
[place] MEDICAID FL2 LEVEL OF CARE SCREENING TOOL     IDENTIFICATION  Patient Name: Isaac [name] Birthdate: 10-Apr-1980 Sex: male Admission Date (Current Location): 06/09/2020  [place] and [place] Number:  [name] [id number] J Facility and Address:  [facility],  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   Cognitive deficit due to recent stroke 03/03/2020   Cocaine use disorder (HCC) 02/07/2020   SVT (supraventricular tachycardia) (HCC) 01/11/2020   Acute cystitis with hematuria    Agitation    Acute urinary tract infection 01/01/2020   History of bacterial meningitis 01/01/2020   Abnormal ECG 01/01/2020   Normocytic anemia 01/01/2020   Combative behavior 12/31/2019   Gastrostomy tube in place (HCC) 12/31/2019   Bacterial meningitis 10/04/2019   NSTEMI (non-ST elevated myocardial infarction) (HCC) 09/28/2019   Demand ischemia (HCC) 09/28/2019   Hypokalemia 09/28/2019    Orientation RESPIRATION BLADDER Height & Weight     Self  Normal Incontinent Weight: 236 lb 15.9 oz (107.5 kg) Height:  6\' 2"  (188 cm)  BEHAVIORAL SYMPTOMS/MOOD NEUROLOGICAL BOWEL NUTRITION STATUS      Incontinent Diet (Diet regular Room service appropriate? Yes; Fluid consistency: Thin / Does not tolerate lactose, Does not eat pork)  AMBULATORY STATUS COMMUNICATION OF NEEDS Skin   Extensive Assist Verbally Normal                       Personal Care Assistance Level of Assistance   Bathing, Feeding, Dressing Bathing Assistance: Maximum assistance Feeding assistance: Limited assistance Dressing Assistance: Limited assistance Total Care Assistance: Maximum assistance   Functional Limitations Info  Sight, Hearing, Speech Sight Info: Adequate Hearing Info: Adequate Speech Info: Impaired    SPECIAL CARE FACTORS FREQUENCY                       Contractures Contractures Info: Not present    Additional Factors Info  Code Status, Allergies, Psychotropic Code Status Info: FULL Allergies Info: Lactose Intolerance (gi) Not Specified Pork-derived Products Psychotropic Info: Valium, Lamictal, Trileptal, Seroquel         Current Medications (08/27/2020):  This is the current hospital active medication list Current Facility-Administered Medications  Medication Dose Route Frequency Provider Last Rate Last Admin   diazepam (VALIUM) tablet 5 mg  5 mg Oral Q12H 08/29/2020, MD   5 mg at 08/26/20 2122   ferrous sulfate tablet 325 mg  325 mg Oral Q breakfast 2123, MD   325 mg at 08/26/20 0912   folic acid (FOLVITE) tablet 1 mg  1 mg Oral Daily 08/28/20, MD   1 mg at 08/26/20 0911   lamoTRIgine (LAMICTAL) tablet 100 mg  100 mg Oral Daily 08/28/20, MD   100 mg at 08/26/20 0912   metoprolol tartrate (LOPRESSOR) tablet 37.5 mg  37.5 mg Oral BID 08/28/20, MD   37.5 mg at 08/26/20 2122   ondansetron (ZOFRAN-ODT) disintegrating tablet 8 mg  8 mg Oral Q8H PRN Mancel Bale, MD   8 mg at 08/07/20 1016   OXcarbazepine (TRILEPTAL) tablet 150 mg  150 mg Oral BID Terrilee Files, MD   150 mg at 08/26/20 2121   QUEtiapine (SEROQUEL) tablet 300 mg  300 mg Oral q AM Terrilee Files, MD   300 mg at 08/26/20 0911   QUEtiapine (SEROQUEL) tablet 400 mg  400 mg Oral QHS Terrilee Files, MD   400 mg at 08/26/20 2121   senna-docusate (Senokot-S) tablet 1 tablet  1 tablet Oral QHS Derwood Kaplan, MD   1 tablet at 08/26/20 2122   thiamine  tablet 100 mg  100 mg Oral Daily Terrilee Files, MD   100 mg at 08/26/20 0867   Current Outpatient Medications  Medication Sig Dispense Refill   polyethylene glycol (MIRALAX / GLYCOLAX) 17 g packet Take 17 g by mouth daily. Hold for diarrhea 30 each 1   diazepam (VALIUM) 5 MG tablet Take 1 tablet (5 mg total) by mouth every 12 (twelve) hours. 60 tablet 0   feeding supplement (ENSURE ENLIVE / ENSURE PLUS) LIQD Take 237 mLs by mouth 2 (two) times daily between meals. (Patient not taking: No sig reported)     ferrous sulfate 325 (65 FE) MG tablet Take 1 tablet (325 mg total) by mouth daily with breakfast. 30 tablet 0   folic acid (FOLVITE) 1 MG tablet Take 1 tablet (1 mg total) by mouth daily. 30 tablet 0   lamoTRIgine (LAMICTAL) 100 MG tablet Take 1 tablet (100 mg total) by mouth daily. 30 tablet 0   Metoprolol Tartrate 37.5 MG TABS Take 37.5 mg by mouth 2 (two) times daily. 60 tablet 0   OXcarbazepine (TRILEPTAL) 150 MG tablet Take 1 tablet (150 mg total) by mouth 2 (two) times daily. 60 tablet 0   QUEtiapine (SEROQUEL) 300 MG tablet Take 1 tablet (300 mg total) by mouth in the morning. 30 tablet 0   QUEtiapine (SEROQUEL) 400 MG tablet Take 1 tablet (400 mg total) by mouth at bedtime. 30 tablet 0   senna-docusate (SENOKOT-S) 8.6-50 MG tablet Take 2 tablets by mouth at bedtime. Hold for diarrhea 20 tablet 0   thiamine 100 MG tablet Take 1 tablet (100 mg total) by mouth daily. 30 tablet 0     Discharge Medications: diazepam (VALIUM) tablet 5 mg Dose: 5 mg Freq: Every 12 hours Route: PO ferrous sulfate tablet 325 mg Dose: 325 mg Freq: Daily with breakfast Route: PO folic acid (FOLVITE) tablet 1 mg Dose: 1 mg Freq: Daily Route: PO lamoTRIgine (LAMICTAL) tablet 100 mg Dose: 100 mg Freq: Daily Route: PO metoprolol tartrate (LOPRESSOR) tablet 37.5 mg Dose: 37.5 mg Freq: 2 times daily Route: PO OXcarbazepine (TRILEPTAL) tablet 150 mg Dose: 150 mg Freq: 2 times daily Route:  PO Indications Comment: aggression QUEtiapine (SEROQUEL) tablet 300 mg Dose: 300 mg Freq: Every morning Route: PO QUEtiapine (SEROQUEL) tablet 400 mg Dose: 400 mg Freq: Daily at bedtime Route: PO senna-docusate (Senokot-S) tablet 1 tablet Dose: 1 tablet Freq: Daily at bedtime Route: PO thiamine tablet 100 mg Dose: 100 mg Freq: Daily Route: PO ondansetron (ZOFRAN-ODT) disintegrating tablet 8 mg Dose: 8 mg Freq: Every 8 hours PRN Route: PO PRN Reasons: nausea,vomiting  Relevant Imaging Results:  Relevant Lab Results:   Additional Information PT SSN: [id number]  [place], [place]

## 2020-11-22 NOTE — Progress Notes (Deleted)
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   UPDATE: Current medications:  Lamotrigine 100mg  daily, oxcarbazepine 150mg  BID, Seroquel 300mg /400mg , Valium 5mg  Q12h, metoprolol 37.5mg  BID, thiamine 100mg  daily     PEG tube was removed.  They were unable to find placement so he went home with his fiancee.  Overall, he has been doing well.  He is less agitated and more manageable.  Once in awhile he may have an outburst.  He is approved to get an aid to help him 8 hours 6 days a week.  He requires assistance with all ADLs.  He is incontinent.  He is more aware of his surroundings.  He is able to follow some commands such as brushing his teeth.  He is still participating in PT/OT/speech therapy.  [place]   08/25/2020 EKG:  QT/QTc 310/438 ms  09/09/2020 LABS:  CMP with Na 140, K 4.4, Cl 102, CO2 30.5, glucose 89, BUN 6, Cr 0.91, t bili 0.3, ALP 109, AST 18, ALT 27; CBC with WBC 5.7, HGB 13.5, HCT 41.3, PLT 291   HISTORY: He presented to the [facility] ED on 09/28/2019 with altered mental status and fever of 104 F, WBC 17.4 and lactate 4.7 after a day of generalized malaise, nausea, vomiting, weakness and headache.  He had elevated troponins over 5000.  Due to combativeness, he required sedation and subsequent intubation and transferred to [facility] where he underwent LP and diagnosed with pneumococcal meningitis.  He was started on ceftriaxone.  Due to  possible seizure, he was started on Keppra and placed on continuous EEG which was negative for seizure activity.  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  He received a 5 day course of SoluMedrol for cerebral edema.  He then developed exteensor posturing with tachycardia and hypertension concerning for elevated intracranial pressure.  He underwent Bolt ICP monitoring which demonstrated no elevated ICP.  Follow up CT head and subsequent repeat MRI of brain showed small subacute hemorrhage in the right suboccipital lobe, either hemorrhagic conversion of small ischemic stroke vs extension of previously seen microhemorrhage, as well as hypoxic injury involving the bilateral caudate and putamen.  He also sustained a NSTEMI.  He was extubated on 10/10/2019 and underwent slow taper of Dilaudid to minimize withdrawal symptoms.  A G-tube was inserted on 10/16/2019 and he was discharged on 10/18/2019 under the care of his mother who is a CNA.  EEG from 12/10/2019 showed generalized slowing but no epileptiform discharges.  We decided to continue AED but transitioned from Keppra to Lamictal for mood stabilization.  He was also started on Seroquel (black box warning explained with mother).  However, he continued to become increasingly agitated and had to be hospitalized on 01/01/2020 for agitation.  CT head personally reviewed showed generalized atrophy but no acute abnormalities.  He was evaluated by psychiatry.  Oxcarbazepine was started in addition to Lamictal and was prescribed Valium.   He has been going to  outpatient PT/OT and has been getting stronger.  He is still taking food and medication via G-tube.  He has not been able to establish care with a PCP yet.  He is easily angry and has kicked his mother.  He does not talk much and often when he speaks, it may be a curse word.  He is still incontinent.  He requires assistance with essential ADLs such as bathing,  dressing and using toilet.  He sleeps well.    Testing: 07/25-26/2021 SERUM LABS:  Lactic acid 4.7, ammonia 20, ethanol negative, troponins 5105, CK 682, CK-MB 5.92, HIV nonreactive, Syphilis screen negative, COVID-19 PCR negative, Cryptococcal antigen negative, C3 and C4 102 and 29.1,  09/28/2019 Urine:  UDS positive for cannabinoids and benzodiazepines 09/28/2019 CT HEAD/CERVICAL SPINE:  1. No acute intracranial abnormalities. 2. No evidence for cervical spine fracture or dislocation.  09/28/2019 ECHOCARDIOGRAM:    1. The left ventricular systolic function is borderline, LVEF is visually estimated at 50%.  2. Definity contrast would clarify LV function.  3. Right ventricle is not well visualized but probably normal.  4. No evidence of an interatrial communication or intrapulmonary shunt.  5. No significant valvular abnormalities.  6. Technically difficult study due to body habitus.  09/29/2019 CSF:  Cell count 3560, 93% neutrophil, protein 187, glucose 13, gram stain  And culture negative, acid fast stain negative, VDRL nonreactive, cryptococcal ag negative, fungal culture negative, VZV PCR negative, ACE negative, HSV 1 & 2 PCR negative, cytology with severe acute inflammation but negative for malignant cells. 09/29/2019 CT H EAD WO:  New multifocal cerebral edema with progressive mild effacement of the ventricles. Recommend correlation with forthcoming MRI.  09/29/2019 MRI BRAIN W WO:  1.Widespread acute microhemorrhagic leukoencephalopathy. Overall pattern is unusual, but given the perivascular distribution, is likely the result of infectious vasculitis.  2.Superimposed findings of meningitis, including leptomeningeal signal abnormality and enhancement.  09/29/2019 CTA/CTV HEAD W WO:  1. Unremarkable CTA of the head. Specifically, no CT angiogram evidence of small or medium vessel vasculitis.  2. No venous thromboses.  3. Extensive supratentorial white matter hypodensities are noted and correspond to  the extensive parenchymal abnormalities seen on recent brain MRI. 09/30/2019 VIDEO EEG:  Severe diffuse cerebral dysfunction, initially slightly worse over the left hemisphere, which is nonspecific for etiology but could be due in part to sedative effect in addition to known bilateral ischemic lesions   As the recording progressed, there is slight improvement in  background activity.  No seizures or epileptiform discharges.  There were occasional extensor posturing movements of both arms associated with up gaze lasting up to several minutes and no associated EEG change other than evidence of reactivity.  Most of these episodes occurred in response to stimulation  10/11/2019 CT HEAD WO:  1.26 x 18 x 30 mm acute hemorrhage in the right parietal lobe the hemorrhage extends to the ependyma of the posterior right lateral ventricle. No intraventricular hemorrhage or subarachnoid hemorrhage is seen.  2.Tiny acute hemorrhage in the right frontal cortex and the tiny acute hemorrhage in the cortex of the posterior superior left temporal lobe. 10/12/2019 MRI BRAIN W WO:  1. Acute parenchymal hemorrhages right parietal lobe and superficial right frontal lobe similar to recent CT scan. Increase in number of superficial supratentorial microhemorrhages.  2. Development of restricted diffusion and T2/FLAIR hyperintensity bilateral basal ganglia indicating acute to early subacute insult such as hypoxic injury or toxic/metabolic insult. Appearance is not suggestive of parenchymal changes of infection.  3.  Maturation of the extensive restricted diffusion throughout the supratentorial white matter now seen as regions of enhancement and T2/FLAIR hyperintensity.  4. Leptomeningeal enhancement posteriorly at the vertex consistent with known meningitis; no abscess or space-occupying infected fluid collection.  10/15/2019 CT HEAD WO:  1. Evolving right parietal lobe parenchymal hematoma. The previously seen right frontal lobe hemorrhage  is no longer definitively visualized. No new parenchymal hemorrhage.  2. Subtle areas of hypodensity within periventricular and subcortical white matter, corresponding to some of the areas of restricted diffusion and abnormal enhancement seen on prior MRI. However, these areas of white matter abnormality are much less conspicuous on CT compared to prior MR imaging.  3. No CT evidence of acute cortically based infarct.  10/15/2019 VIDEO EEG:  This EEG is abnormal secondary to moderate diffuse slowing.   PAST MEDICAL HISTORY: Past Medical History:  Diagnosis Date   History of bacterial meningitis 01/01/2020   NSTEMI (non-ST elevated myocardial infarction) (HCC) 09/28/2019    MEDICATIONS: Current Outpatient Medications on File Prior to Visit  Medication Sig Dispense Refill   diazepam (VALIUM) 5 MG tablet Take 1 tablet (5 mg total) by mouth every 12 (twelve) hours. 60 tablet 0   feeding supplement (ENSURE ENLIVE / ENSURE PLUS) LIQD Take 237 mLs by mouth 2 (two) times daily between meals. (Patient not taking: No sig reported)     ferrous sulfate 325 (65 FE) MG tablet Take 1 tablet (325 mg total) by mouth daily with breakfast. 30 tablet 0   folic acid (FOLVITE) 1 MG tablet Take 1 tablet (1 mg total) by mouth daily. 30 tablet 0   lamoTRIgine (LAMICTAL) 100 MG tablet Take 1 tablet (100 mg total) by mouth daily. 30 tablet 0   Metoprolol Tartrate 37.5 MG TABS Take 37.5 mg by mouth 2 (two) times daily. 60 tablet 0   OXcarbazepine (TRILEPTAL) 150 MG tablet Take 1 tablet (150 mg total) by mouth 2 (two) times daily. 60 tablet 0   polyethylene glycol (MIRALAX / GLYCOLAX) 17 g packet Take 17 g by mouth daily. Hold for diarrhea 30 each 1   QUEtiapine (SEROQUEL) 300 MG tablet Take 1 tablet (300 mg total) by mouth in the morning. 30 tablet 0   QUEtiapine (SEROQUEL) 400 MG tablet Take 1 tablet (400 mg total) by mouth at bedtime. 30 tablet 0   senna-docusate (SENOKOT-S) 8.6-50 MG tablet Take 2 tablets by mouth at  bedtime. Hold for diarrhea 20 tablet 0   thiamine 100 MG tablet Take 1 tablet (100 mg total) by mouth daily. 30 tablet 0   No current facility-administered medications on file prior to visit.    ALLERGIES: Allergies  Allergen Reactions   Lactose Intolerance (Gi)    Pork-Derived Products     FAMILY HISTORY: No family history on file.    Objective:  *** General: No acute distress.  Patient appears ***-groomed.   Head:  Normocephalic/atraumatic Eyes:  Fundi examined but not visualized Neck: supple, no paraspinal tenderness, full range of motion Heart:  Regular rate and rhythm Lungs:  Clear to auscultation bilaterally Back: No paraspinal tenderness Neurological Exam: alert and oriented to at least self.  He has mixed expressive and receptive aphasia. Unable to assess visual fields but overall CN II-XII intact. Bulk and tone normal, muscle strength 5/5 throughout.  Deep tendon reflexes 3+ right upper extremity, otherwise throughout.  Unable to assess finger to nose.  Stands from wheelchair with assistance.  Difficulty ambulating.   Shon Millet, DO  CC: Sundra Aland,  MD

## 2020-11-23 ENCOUNTER — Ambulatory Visit: Payer: Medicaid Other | Admitting: Neurology

## 2021-02-23 ENCOUNTER — Ambulatory Visit (INDEPENDENT_AMBULATORY_CARE_PROVIDER_SITE_OTHER): Payer: Medicaid Other | Admitting: Podiatry

## 2021-02-23 ENCOUNTER — Other Ambulatory Visit: Payer: Self-pay

## 2021-02-23 ENCOUNTER — Encounter: Payer: Self-pay | Admitting: Podiatry

## 2021-02-23 DIAGNOSIS — M79674 Pain in right toe(s): Secondary | ICD-10-CM | POA: Diagnosis not present

## 2021-02-23 DIAGNOSIS — M79675 Pain in left toe(s): Secondary | ICD-10-CM | POA: Diagnosis not present

## 2021-02-23 DIAGNOSIS — B351 Tinea unguium: Secondary | ICD-10-CM | POA: Diagnosis not present

## 2021-02-23 NOTE — Progress Notes (Signed)
This patient returns to the office for evaluation and treatment of long thick painful nails .  This patient is unable to trim his own nails since the patient cannot reach his feet.  Patient says the nails are painful walking and wearing his shoes. He presents to the office in a wheelchair accompanied by two caregivers. Patient has history of CVA. He returns for preventive foot care services.  General Appearance  Alert, conversant and in no acute stress.  Vascular  Dorsalis pedis and posterior tibial  pulses are palpable  bilaterally.  Capillary return is within normal limits  bilaterally. Temperature is within normal limits  bilaterally.  Neurologic  deferred since lacks motion due to CVA.  Nails Thick disfigured discolored nails with subungual debris  from hallux to fifth toes bilaterally. No evidence of bacterial infection or drainage bilaterally.  Orthopedic  No limitations of motion  feet .  No crepitus or effusions noted.  No bony pathology or digital deformities noted.  Skin  normotropic skin with no porokeratosis noted bilaterally.  No signs of infections or ulcers noted.     Onychomycosis  Pain in toes right foot  Pain in toes left foot  Debridement  of nails  1-5  B/L with a nail nipper.  Nails were then filed using a dremel tool with no incidents.    RTC  4 months    Helane Gunther DPM

## 2021-06-09 NOTE — Progress Notes (Signed)
? ?NEUROLOGY FOLLOW UP OFFICE NOTE ? ?[name] ?[id number] ? ?Assessment/Plan:  ? ?1.  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  ? As prescribed by PCP: ?1.  Lamotrigine 100mg  daily ?2.  Oxcarbazepine 150mg  twice daily ?3.  Seroquel 300mg  in AM and 400mg  at bedtime.  Discussed black box warning with fiancee.  Request repeat EKG to evaluate QT interval.  They would like it performed in [place] at his PCP's office. ?4.  Diazepam 5mg  every 12 hours ?5.  Follow up one year ?  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  He is accompanied by his mother who supplements history. ?  ?UPDATE: ?Current medications:  Lamotrigine 100mg  daily, oxcarbazepine 150mg  BID, Seroquel 300mg  in AM and 400mg  QHS, Valium 5mg  Q12h, metoprolol 37.5mg  BID, thiamine 100mg  daily ?  ?He is residing at .  He is in a wheelchair and unable to walk without assistance.  He requires assistance with all ADLs.  He is incontinent.  He is more aware of his surroundings.  He is able to follow some commands such as brushing his teeth.  No longer receiving PT/OT/ST.  He needs assistance with ambulation and will walk when somebody is with him, such as his mother.   ?  ?HISTORY: ?He presented to the [facility] ED on 09/28/2019 with altered mental status and fever of 104 F, WBC 17.4 and lactate 4.7 after a day of generalized malaise, nausea, vomiting, weakness and headache.  He had elevated troponins over 5000.  Due to combativeness, he required sedation and subsequent intubation and transferred to [facility] where he underwent LP and diagnosed with pneumococcal meningitis.  He was started on ceftriaxone.  Due to possible seizure, he was started on Keppra and placed on continuous EEG which was negative for  seizure activity.  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  He received a 5 day course of SoluMedrol for cerebral edema.  He then developed exteensor posturing with tachycardia and hypertension concerning for elevated intracranial pressure.  He underwent Bolt ICP monitoring which demonstrated no elevated ICP.  Follow up CT head and subsequent repeat MRI of brain showed small subacute hemorrhage in the right suboccipital lobe, either hemorrhagic conversion of small ischemic stroke vs extension of previously seen microhemorrhage, as well as hypoxic injury involving the bilateral caudate and putamen.  He also sustained a NSTEMI.  He was extubated on 10/10/2019 and underwent slow taper of Dilaudid to minimize withdrawal symptoms.  A G-tube was inserted on 10/16/2019 and he was discharged on 10/18/2019 under the care of his mother who is a CNA.  EEG from 12/10/2019 showed generalized slowing but no epileptiform discharges.  We decided to continue AED but transitioned from Keppra to Lamictal for mood stabilization.  He was also started on Seroquel (black box warning explained with mother).  However, he continued to become increasingly agitated and had to be hospitalized on 01/01/2020 for agitation.  CT head personally reviewed showed generalized atrophy but no acute abnormalities.  He was evaluated by psychiatry.  Oxcarbazepine was started in addition to Lamictal and was prescribed Valium ? ?Testing: ?07/25-26/2021 SERUM LABS:  Lactic acid 4.7, ammonia 20, ethanol negative, troponins 5105, CK 682, CK-MB 5.92, HIV nonreactive, Syphilis screen negative, COVID-19 PCR negative, Cryptococcal  antigen negative, C3 and C4 102 and 29.1,  ?09/28/2019 Urine:  UDS positive for cannabinoids and benzodiazepines ?09/28/2019 CT HEAD/CERVICAL SPINE:  1. No acute intracranial abnormalities. 2. No evidence for cervical spine fracture or dislocation.  ?09/28/2019 ECHOCARDIOGRAM:    1.  The left ventricular systolic function is borderline, LVEF is visually estimated at 50%.  2. Definity contrast would clarify LV function.  3. Right ventricle is not well visualized but probably normal.  4. No evidence of an interatrial communication or intrapulmonary shunt.  5. No significant valvular abnormalities.  6. Technically difficult study due to body habitus.  ?09/29/2019 CSF:  Cell count 3560, 93% neutrophil, protein 187, glucose 13, gram stain  And culture negative, acid fast stain negative, VDRL nonreactive, cryptococcal ag negative, fungal culture negative, VZV PCR negative, ACE negative, HSV 1 & 2 PCR negative, cytology with severe acute inflammation but negative for malignant cells. ?09/29/2019 CT H EAD WO:  New multifocal cerebral edema with progressive mild effacement of the ventricles. Recommend correlation with forthcoming MRI.  ?09/29/2019 MRI BRAIN W WO:  1.Widespread acute microhemorrhagic leukoencephalopathy. Overall pattern is unusual, but given the perivascular distribution, is likely the result of infectious vasculitis.  2.Superimposed findings of meningitis, including leptomeningeal signal abnormality and enhancement.  ?09/29/2019 CTA/CTV HEAD W WO:  1. Unremarkable CTA of the head. Specifically, no CT angiogram evidence of small or medium vessel vasculitis.  2. No venous thromboses.  3. Extensive supratentorial white matter hypodensities are noted and correspond to the extensive parenchymal abnormalities seen on recent brain MRI. ?09/30/2019 VIDEO EEG:  Severe diffuse cerebral dysfunction, initially slightly worse over the left hemisphere, which is nonspecific for etiology but could be due in part to sedative effect in addition to known bilateral ischemic lesions   As the recording progressed, there is slight improvement in  background activity.  No seizures or epileptiform discharges.  There were occasional extensor posturing movements of both arms associated with up gaze lasting up to  several minutes and no associated EEG change other than evidence of reactivity.  Most of these episodes occurred in response to stimulation  ?10/11/2019 CT HEAD WO:  1.26 x 18 x 30 mm acute hemorrhage in the right parietal lobe the hemorrhage extends to the ependyma of the posterior right lateral ventricle. No intraventricular hemorrhage or subarachnoid hemorrhage is seen.  2.Tiny acute hemorrhage in the right frontal cortex and the tiny acute hemorrhage in the cortex of the posterior superior left temporal lobe. ?10/12/2019 MRI BRAIN W WO:  1. Acute parenchymal hemorrhages right parietal lobe and superficial right frontal lobe similar to recent CT scan. Increase in number of superficial supratentorial microhemorrhages.  2. Development of restricted diffusion and T2/FLAIR hyperintensity bilateral basal ganglia indicating acute to early subacute insult such as hypoxic injury or toxic/metabolic insult. Appearance is not suggestive of parenchymal changes of infection.  3. Maturation of the extensive restricted diffusion throughout the supratentorial white matter now seen as regions of enhancement and T2/FLAIR hyperintensity.  4. Leptomeningeal enhancement posteriorly at the vertex consistent with known meningitis; no abscess or space-occupying infected fluid collection.  ?10/15/2019 CT HEAD WO:  1. Evolving right parietal lobe parenchymal hematoma. The previously seen right frontal lobe hemorrhage is no longer definitively visualized. No new parenchymal hemorrhage.  2. Subtle areas of hypodensity within periventricular and subcortical white matter, corresponding to some of the areas of restricted diffusion and abnormal enhancement seen on prior MRI. However, these areas of white matter abnormality are much less conspicuous on CT compared  to prior MR imaging.  3. No CT evidence of acute cortically based infarct.  ?10/15/2019 VIDEO EEG:  This EEG is abnormal secondary to moderate diffuse slowing.  ? ?PAST MEDICAL  HISTORY: ?Past Medical History:  ?Diagnosis Date  ? History of bacterial meningitis 01/01/2020  ? NSTEMI (non-ST elevated myocardial infarction) (HCC) 09/28/2019  ? ? ?MEDICATIONS: ?Current Outpatient Medica

## 2021-06-13 ENCOUNTER — Ambulatory Visit: Payer: Medicaid Other | Admitting: Neurology

## 2021-06-13 ENCOUNTER — Encounter: Payer: Self-pay | Admitting: Neurology

## 2021-06-13 VITALS — BP 114/77 | HR 74 | Resp 18 | Ht 71.0 in | Wt 260.0 lb

## 2021-06-13 DIAGNOSIS — G042 Bacterial meningoencephalitis and meningomyelitis, not elsewhere classified: Secondary | ICD-10-CM

## 2021-06-13 DIAGNOSIS — F09 Unspecified mental disorder due to known physiological condition: Secondary | ICD-10-CM | POA: Diagnosis not present

## 2021-06-13 DIAGNOSIS — R4701 Aphasia: Secondary | ICD-10-CM | POA: Diagnosis not present

## 2021-06-13 NOTE — Patient Instructions (Signed)
Continue current regimen as per Dr. Wyline Mood ?Follow up one year ?

## 2021-06-27 ENCOUNTER — Encounter: Payer: Self-pay | Admitting: Podiatry

## 2021-06-27 ENCOUNTER — Ambulatory Visit (INDEPENDENT_AMBULATORY_CARE_PROVIDER_SITE_OTHER): Payer: Medicaid Other | Admitting: Podiatry

## 2021-06-27 DIAGNOSIS — B351 Tinea unguium: Secondary | ICD-10-CM | POA: Diagnosis not present

## 2021-06-27 DIAGNOSIS — M79674 Pain in right toe(s): Secondary | ICD-10-CM

## 2021-06-27 DIAGNOSIS — M79675 Pain in left toe(s): Secondary | ICD-10-CM | POA: Diagnosis not present

## 2021-06-27 NOTE — Progress Notes (Signed)
This patient returns to the office for evaluation and treatment of long thick painful nails .  This patient is unable to trim his own nails since the patient cannot reach his feet.  Patient says the nails are painful walking and wearing his shoes. He presents to the office in a wheelchair accompanied by two caregivers. Patient has history of CVA. He returns for preventive foot care services. ° °General Appearance  Alert, conversant and in no acute stress. ° °Vascular  Dorsalis pedis and posterior tibial  pulses are palpable  bilaterally.  Capillary return is within normal limits  bilaterally. Temperature is within normal limits  bilaterally. ° °Neurologic  deferred since lacks motion due to CVA. ° °Nails Thick disfigured discolored nails with subungual debris  from hallux to fifth toes bilaterally. No evidence of bacterial infection or drainage bilaterally. ° °Orthopedic  No limitations of motion  feet .  No crepitus or effusions noted.  No bony pathology or digital deformities noted. ° °Skin  normotropic skin with no porokeratosis noted bilaterally.  No signs of infections or ulcers noted.    ° °Onychomycosis  Pain in toes right foot  Pain in toes left foot ° °Debridement  of nails  1-5  B/L with a nail nipper.  Nails were then filed using a dremel tool with no incidents.    RTC  4 months  ° ° °Humaira Sculley DPM   °

## 2021-10-31 ENCOUNTER — Ambulatory Visit: Payer: Medicaid Other | Admitting: Podiatry

## 2021-11-18 IMAGING — DX DG CHEST 1V PORT
1 series · 1 of 1 positions shown · non-contrast
Comparison: None.

CLINICAL DATA: Fever and change in mental status

EXAM:
PORTABLE CHEST 1 VIEW

[chest ap]
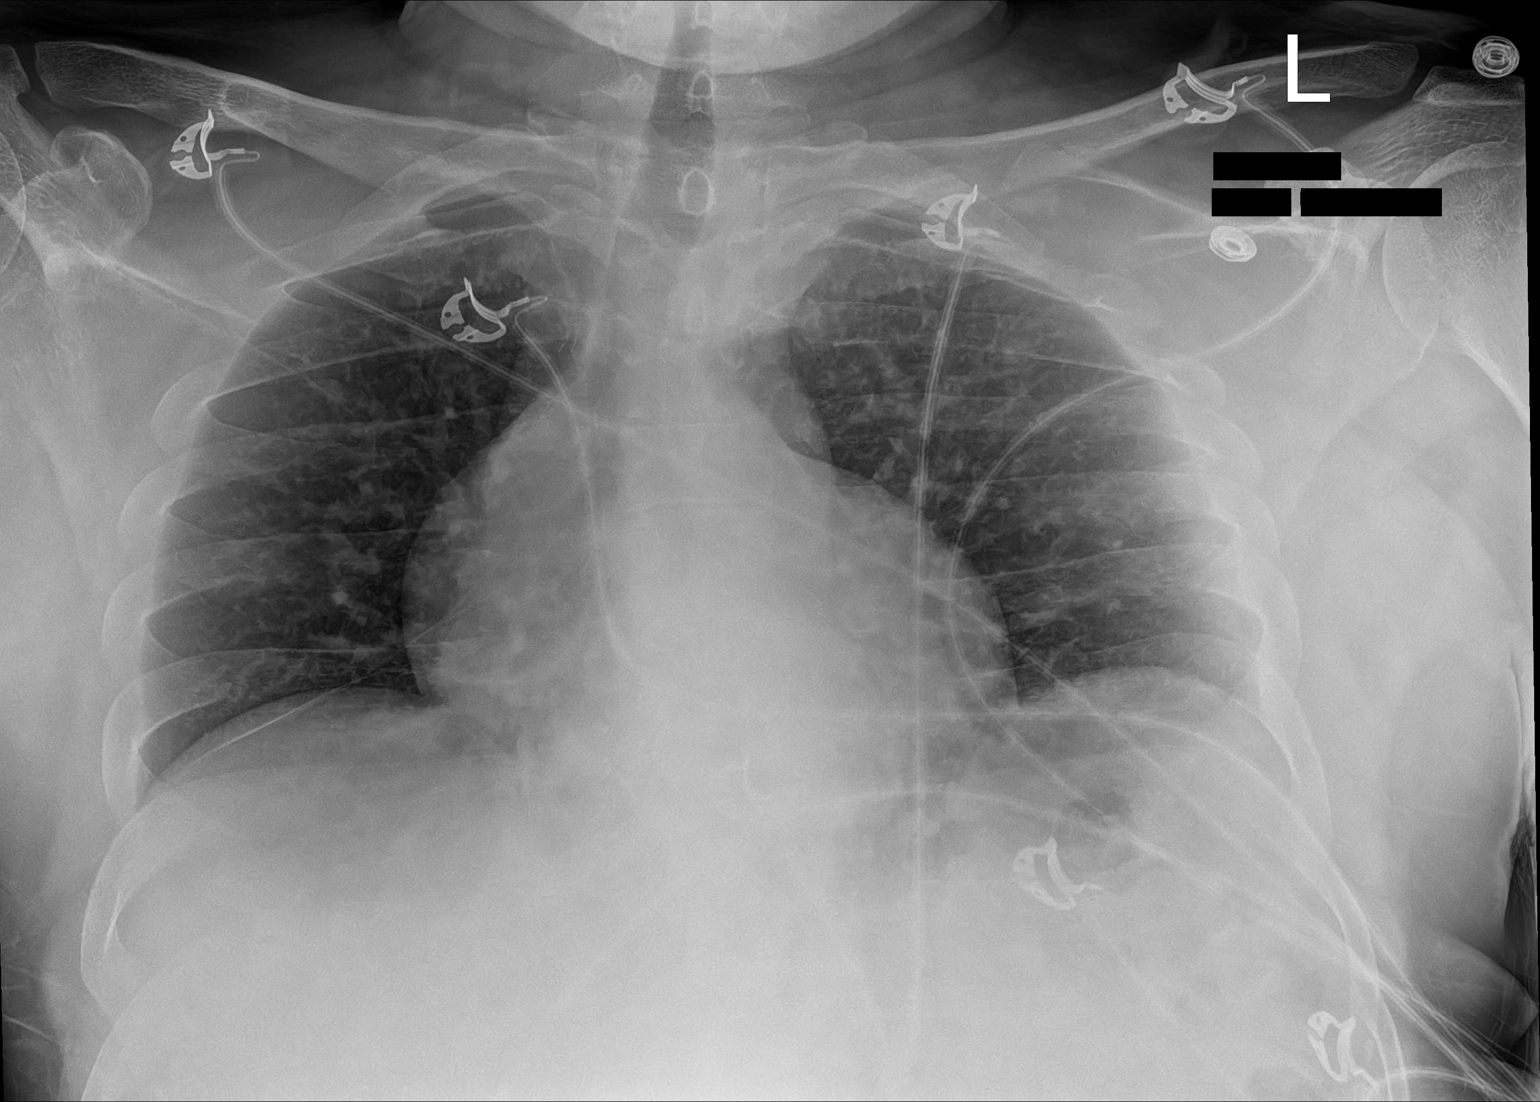

[1 of 1 positions shown; findings below may reference images not displayed]

FINDINGS: The heart size and mediastinal contours are within normal limits.
Both hazy airspace opacity seen at the periphery of the left lung.
The visualized skeletal structures are unremarkable.
IMPRESSION: Hazy airspace opacity at the periphery of the left lung which could
be due to atelectasis or infectious etiology.

## 2021-12-06 ENCOUNTER — Ambulatory Visit: Payer: Medicaid Other | Admitting: Podiatry

## 2022-01-17 ENCOUNTER — Ambulatory Visit: Payer: Medicaid Other | Admitting: Podiatry

## 2022-01-18 ENCOUNTER — Ambulatory Visit (INDEPENDENT_AMBULATORY_CARE_PROVIDER_SITE_OTHER): Payer: Medicaid Other | Admitting: Podiatry

## 2022-01-18 DIAGNOSIS — M79674 Pain in right toe(s): Secondary | ICD-10-CM

## 2022-01-18 DIAGNOSIS — B351 Tinea unguium: Secondary | ICD-10-CM

## 2022-01-18 DIAGNOSIS — M79675 Pain in left toe(s): Secondary | ICD-10-CM

## 2022-01-18 NOTE — Progress Notes (Signed)
This patient returns to the office for evaluation and treatment of long thick painful nails .  This patient is unable to trim his own nails since the patient cannot reach his feet.  Patient says the nails are painful walking and wearing his shoes. He presents to the office in a wheelchair accompanied by his mother.. Patient has history of CVA. He returns for preventive foot care services.  General Appearance  Alert, conversant and in no acute stress.  Vascular  Dorsalis pedis and posterior tibial  pulses are palpable  bilaterally.  Capillary return is within normal limits  bilaterally. Cold feet  bilaterally.  Neurologic  deferred since lacks motion due to CVA.  Nails Thick disfigured discolored nails with subungual debris  from hallux to fifth toes bilaterally. No evidence of bacterial infection or drainage bilaterally.  Orthopedic  No limitations of motion  feet .  No crepitus or effusions noted.  No bony pathology or digital deformities noted.  Skin  normotropic skin with no porokeratosis noted bilaterally.  No signs of infections or ulcers noted.     Onychomycosis  Pain in toes right foot  Pain in toes left foot  Debridement  of nails  1-5  B/L with a nail nipper.  Nails were then filed using a dremel tool with no incidents.    RTC  4 months    Helane Gunther DPM

## 2022-02-15 ENCOUNTER — Encounter: Payer: Self-pay | Admitting: Neurology

## 2022-05-01 ENCOUNTER — Ambulatory Visit: Payer: Medicare Other | Admitting: Podiatry

## 2022-05-17 NOTE — Progress Notes (Deleted)
NEUROLOGY FOLLOW UP OFFICE NOTE  Isaac [name] [id number]  Assessment/Plan:   1.  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  Lamotrigine '100mg'$  daily 2.  Oxcarbazepine '150mg'$  twice daily 3.  Seroquel '300mg'$  in AM and '400mg'$  at bedtime.   4.  Diazepam '5mg'$  every 12 hours 5.  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  He is accompanied by his mother who supplements history.   UPDATE: Current medications:  Lamotrigine '100mg'$  daily, oxcarbazepine '150mg'$  BID, Seroquel '300mg'$  in AM and '400mg'$  QHS, Valium '5mg'$  Q12h, metoprolol 37.'5mg'$  BID, thiamine '100mg'$  daily   He is residing at [facility].  He is in a wheelchair and unable to walk without assistance.  He requires assistance with all ADLs.  He is incontinent.  He is more aware of his surroundings.  He is able to follow some commands such as brushing his teeth.  No longer receiving PT/OT/ST.  He needs assistance with ambulation and will walk when somebody is with him, such as his mother.     HISTORY: He presented to the [facility] ED on 09/28/2019 with altered mental status and fever of 104 F, WBC 17.4 and lactate 4.7 after a day of generalized malaise, nausea, vomiting, weakness and headache.  He had elevated troponins over 5000.  Due to combativeness, he required sedation and subsequent intubation and transferred to [place] where he underwent LP and diagnosed with pneumococcal meningitis.  He was started on ceftriaxone.  Due to possible seizure, he was started on Keppra and placed on continuous EEG which was negative for seizure activity.  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  He received a  5 day course of SoluMedrol for cerebral edema.  He then developed exteensor posturing with tachycardia and hypertension concerning for elevated intracranial pressure.  He underwent Bolt ICP monitoring which demonstrated no elevated ICP.  Follow up CT head and subsequent repeat MRI of brain showed small subacute hemorrhage in the right suboccipital lobe, either hemorrhagic conversion of small ischemic stroke vs extension of previously seen microhemorrhage, as well as hypoxic injury involving the bilateral caudate and putamen.  He also sustained a NSTEMI.  He was extubated on 10/10/2019 and underwent slow taper of Dilaudid to minimize withdrawal symptoms.  A G-tube was inserted on 10/16/2019 and he was discharged on 10/18/2019 under the care of his mother who is a CNA.  EEG from 12/10/2019 showed generalized slowing but no epileptiform discharges.  We decided to continue AED but transitioned from [place] to Lamictal for mood stabilization.  He was also started on Seroquel (black box warning explained with mother).  However, he continued to become increasingly agitated and had to be hospitalized on 01/01/2020 for agitation.  CT head personally reviewed showed generalized atrophy but no acute abnormalities.  He was evaluated by psychiatry.  Oxcarbazepine was started in addition to Lamictal and was prescribed Valium  Testing: 07/25-26/2021 SERUM LABS:  Lactic acid 4.7, ammonia 20, ethanol negative, troponins 5105, CK 682, CK-MB 5.92, HIV nonreactive, Syphilis screen negative, COVID-19 PCR negative, Cryptococcal antigen negative, C3 and C4 102 and 29.1,  09/28/2019 Urine:  UDS positive for cannabinoids and benzodiazepines 09/28/2019 CT HEAD/CERVICAL SPINE:  1.  No acute intracranial abnormalities. 2. No evidence for cervical spine fracture or dislocation.  09/28/2019 ECHOCARDIOGRAM:    1. The left ventricular systolic function is borderline, LVEF is visually estimated at 50%.  2. Definity contrast would clarify LV  function.  3. Right ventricle is not well visualized but probably normal.  4. No evidence of an interatrial communication or intrapulmonary shunt.  5. No significant valvular abnormalities.  6. Technically difficult study due to body habitus.  09/29/2019 CSF:  Cell count 3560, 93% neutrophil, protein 187, glucose 13, gram stain  And culture negative, acid fast stain negative, VDRL nonreactive, cryptococcal ag negative, fungal culture negative, VZV PCR negative, ACE negative, HSV 1 & 2 PCR negative, cytology with severe acute inflammation but negative for malignant cells. 09/29/2019 CT H EAD WO:  New multifocal cerebral edema with progressive mild effacement of the ventricles. Recommend correlation with forthcoming MRI.  09/29/2019 MRI BRAIN W WO:  1.Widespread acute microhemorrhagic leukoencephalopathy. Overall pattern is unusual, but given the perivascular distribution, is likely the result of infectious vasculitis.  2.Superimposed findings of meningitis, including leptomeningeal signal abnormality and enhancement.  09/29/2019 CTA/CTV HEAD W WO:  1. Unremarkable CTA of the head. Specifically, no CT angiogram evidence of small or medium vessel vasculitis.  2. No venous thromboses.  3. Extensive supratentorial white matter hypodensities are noted and correspond to the extensive parenchymal abnormalities seen on recent brain MRI. 09/30/2019 VIDEO EEG:  Severe diffuse cerebral dysfunction, initially slightly worse over the left hemisphere, which is nonspecific for etiology but could be due in part to sedative effect in addition to known bilateral ischemic lesions   As the recording progressed, there is slight improvement in  background activity.  No seizures or epileptiform discharges.  There were occasional extensor posturing movements of both arms associated with up gaze lasting up to several minutes and no associated EEG change other than evidence of reactivity.  Most of these episodes occurred in response to  stimulation  10/11/2019 CT HEAD WO:  1.26 x 18 x 30 mm acute hemorrhage in the right parietal lobe the hemorrhage extends to the ependyma of the posterior right lateral ventricle. No intraventricular hemorrhage or subarachnoid hemorrhage is seen.  2.Tiny acute hemorrhage in the right frontal cortex and the tiny acute hemorrhage in the cortex of the posterior superior left temporal lobe. 10/12/2019 MRI BRAIN W WO:  1. Acute parenchymal hemorrhages right parietal lobe and superficial right frontal lobe similar to recent CT scan. Increase in number of superficial supratentorial microhemorrhages.  2. Development of restricted diffusion and T2/FLAIR hyperintensity bilateral basal ganglia indicating acute to early subacute insult such as hypoxic injury or toxic/metabolic insult. Appearance is not suggestive of parenchymal changes of infection.  3. Maturation of the extensive restricted diffusion throughout the supratentorial white matter now seen as regions of enhancement and T2/FLAIR hyperintensity.  4. Leptomeningeal enhancement posteriorly at the vertex consistent with known meningitis; no abscess or space-occupying infected fluid collection.  10/15/2019 CT HEAD WO:  1. Evolving right parietal lobe parenchymal hematoma. The previously seen right frontal lobe hemorrhage is no longer definitively visualized. No new parenchymal hemorrhage.  2. Subtle areas of hypodensity within periventricular and subcortical white matter, corresponding to some of the areas of restricted diffusion and abnormal enhancement seen on prior MRI. However, these areas of white matter abnormality are much less conspicuous on CT compared to prior MR imaging.  3. No CT evidence of acute cortically based infarct.  10/15/2019 VIDEO EEG:  This EEG is abnormal secondary  to moderate diffuse slowing.   PAST MEDICAL HISTORY: Past Medical History:  Diagnosis Date   History of bacterial meningitis 01/01/2020   NSTEMI (non-ST elevated myocardial  infarction) ([place]) 09/28/2019    MEDICATIONS: Current Outpatient Medications on File Prior to Visit  Medication Sig Dispense Refill   diazepam (VALIUM) 5 MG tablet Take 1 tablet (5 mg total) by mouth every 12 (twelve) hours. 60 tablet 0   feeding supplement (ENSURE ENLIVE / ENSURE PLUS) LIQD Take 237 mLs by mouth 2 (two) times daily between meals. (Patient not taking: Reported on 06/13/2021)     ferrous sulfate 325 (65 FE) MG tablet Take 1 tablet (325 mg total) by mouth daily with breakfast. 30 tablet 0   folic acid (FOLVITE) 1 MG tablet Take 1 tablet (1 mg total) by mouth daily. 30 tablet 0   lamoTRIgine (LAMICTAL) 100 MG tablet Take 1 tablet (100 mg total) by mouth daily. 30 tablet 0   Metoprolol Tartrate 37.5 MG TABS Take 37.5 mg by mouth 2 (two) times daily. 60 tablet 0   OXcarbazepine (TRILEPTAL) 150 MG tablet Take 1 tablet (150 mg total) by mouth 2 (two) times daily. 60 tablet 0   polyethylene glycol (MIRALAX / GLYCOLAX) 17 g packet Take 17 g by mouth daily. Hold for diarrhea 30 each 1   QUEtiapine (SEROQUEL) 300 MG tablet Take 1 tablet (300 mg total) by mouth in the morning. 30 tablet 0   QUEtiapine (SEROQUEL) 400 MG tablet Take 1 tablet (400 mg total) by mouth at bedtime. (Patient taking differently: Take 400 mg by mouth once.) 30 tablet 0   senna-docusate (SENOKOT-S) 8.6-50 MG tablet Take 2 tablets by mouth at bedtime. Hold for diarrhea 20 tablet 0   thiamine 100 MG tablet Take 1 tablet (100 mg total) by mouth daily. 30 tablet 0   No current facility-administered medications on file prior to visit.    ALLERGIES: Allergies  Allergen Reactions   Lactose Intolerance (Gi)    Pork-Derived Products     FAMILY HISTORY: No family history on file.    Objective:  ***. General: No acute distress.  Patient appears well-groomed.   Head:  Normocephalic/atraumatic Eyes:  Fundi examined but not visualized Neck: supple, no paraspinal tenderness, full range of motion Heart:  Regular rate  and rhythm Neurological Exam: Alert and oriented to at least self, otherwise unable to assess.  He has mixed expressive and receptive aphasia  Unable to assess visual fields and does not track my finger with his eyes due to aphasia but overall CN II-XII intact.  Bulk and tone normal.  Muscle strength 5/5 throughout.  Deep tendon reflexes 3+ right upper extremity, otherwise 2+ throughout.  Unable to assess finger to nose testing.  Stands from wheelchair with assistance.  Difficulty ambulating.    Metta Clines, DO  CC: Catalina Antigua, MD

## 2022-05-18 ENCOUNTER — Encounter: Payer: Self-pay | Admitting: Neurology

## 2022-05-18 ENCOUNTER — Ambulatory Visit: Payer: Medicare Other | Admitting: Neurology

## 2022-06-06 ENCOUNTER — Ambulatory Visit (INDEPENDENT_AMBULATORY_CARE_PROVIDER_SITE_OTHER): Payer: Medicare Other | Admitting: Podiatry

## 2022-06-06 ENCOUNTER — Encounter: Payer: Self-pay | Admitting: Podiatry

## 2022-06-06 DIAGNOSIS — M79674 Pain in right toe(s): Secondary | ICD-10-CM

## 2022-06-06 DIAGNOSIS — B351 Tinea unguium: Secondary | ICD-10-CM

## 2022-06-06 DIAGNOSIS — M79675 Pain in left toe(s): Secondary | ICD-10-CM

## 2022-06-06 NOTE — Progress Notes (Signed)
This patient returns to the office for evaluation and treatment of long thick painful nails .  This patient is unable to trim his own nails since the patient cannot reach his feet.  Patient says the nails are painful walking and wearing his shoes. He presents to the office in a wheelchair accompanied by his mother.. Patient has history of CVA. He returns for preventive foot care services.  General Appearance  Alert, conversant and in no acute stress.  Vascular  Dorsalis pedis and posterior tibial  pulses are palpable  bilaterally.  Capillary return is within normal limits  bilaterally. Cold feet  bilaterally.  Neurologic  deferred since lacks motion due to CVA.  Nails Thick disfigured discolored nails with subungual debris  from hallux to fifth toes bilaterally. No evidence of bacterial infection or drainage bilaterally.  Orthopedic  No limitations of motion  feet .  No crepitus or effusions noted.  No bony pathology or digital deformities noted.  Skin  normotropic skin with no porokeratosis noted bilaterally.  No signs of infections or ulcers noted.     Onychomycosis  Pain in toes right foot  Pain in toes left foot  Debridement  of nails  1-5  B/L with a nail nipper.  Nails were then filed using a dremel tool with no incidents.    RTC  4 months    Kyair Ditommaso DPM   

## 2022-06-14 ENCOUNTER — Ambulatory Visit: Payer: Medicaid Other | Admitting: Neurology

## 2022-10-10 ENCOUNTER — Ambulatory Visit: Payer: Medicare Other | Admitting: Podiatry

## 2022-11-10 ENCOUNTER — Ambulatory Visit (INDEPENDENT_AMBULATORY_CARE_PROVIDER_SITE_OTHER): Payer: Medicare Other | Admitting: Podiatry

## 2022-11-10 ENCOUNTER — Encounter: Payer: Self-pay | Admitting: Podiatry

## 2022-11-10 DIAGNOSIS — B351 Tinea unguium: Secondary | ICD-10-CM | POA: Diagnosis not present

## 2022-11-10 DIAGNOSIS — M79674 Pain in right toe(s): Secondary | ICD-10-CM | POA: Diagnosis not present

## 2022-11-10 DIAGNOSIS — M79675 Pain in left toe(s): Secondary | ICD-10-CM | POA: Diagnosis not present

## 2022-11-10 NOTE — Progress Notes (Signed)
This patient returns to the office for evaluation and treatment of long thick painful nails .  This patient is unable to trim his own nails since the patient cannot reach his feet.  Patient says the nails are painful walking and wearing his shoes. He presents to the office in a wheelchair accompanied by his mother.. Patient has history of CVA. He returns for preventive foot care services.  General Appearance  Alert, conversant and in no acute stress.  Vascular  Dorsalis pedis and posterior tibial  pulses are palpable  bilaterally.  Capillary return is within normal limits  bilaterally. Cold feet  bilaterally.  Neurologic  deferred since lacks motion due to CVA.  Nails Thick disfigured discolored nails with subungual debris  from hallux to fifth toes bilaterally. No evidence of bacterial infection or drainage bilaterally.  Orthopedic  No limitations of motion  feet .  No crepitus or effusions noted.  No bony pathology or digital deformities noted.  Skin  normotropic skin with no porokeratosis noted bilaterally.  No signs of infections or ulcers noted.     Onychomycosis  Pain in toes right foot  Pain in toes left foot  Debridement  of nails  1-5  B/L with a nail nipper.  Nails were then filed using a dremel tool with no incidents.    RTC  4 months    Gregory Mayer DPM   

## 2023-03-12 ENCOUNTER — Ambulatory Visit: Payer: Medicare Other | Admitting: Podiatry

## 2023-03-15 ENCOUNTER — Ambulatory Visit (INDEPENDENT_AMBULATORY_CARE_PROVIDER_SITE_OTHER): Payer: Medicare Other | Admitting: Podiatry

## 2023-03-15 ENCOUNTER — Encounter: Payer: Self-pay | Admitting: Podiatry

## 2023-03-15 DIAGNOSIS — M79675 Pain in left toe(s): Secondary | ICD-10-CM

## 2023-03-15 DIAGNOSIS — B351 Tinea unguium: Secondary | ICD-10-CM | POA: Diagnosis not present

## 2023-03-15 DIAGNOSIS — M79674 Pain in right toe(s): Secondary | ICD-10-CM

## 2023-03-15 NOTE — Progress Notes (Signed)
This patient returns to the office for evaluation and treatment of long thick painful nails .  This patient is unable to trim his own nails since the patient cannot reach his feet.  Patient says the nails are painful walking and wearing his shoes. He presents to the office in a wheelchair accompanied by his mother.. Patient has history of CVA. He returns for preventive foot care services.  General Appearance  Alert, conversant and in no acute stress.  Vascular  Dorsalis pedis and posterior tibial  pulses are palpable  bilaterally.  Capillary return is within normal limits  bilaterally. Cold feet  bilaterally.  Neurologic  deferred since lacks motion due to CVA.  Nails Thick disfigured discolored nails with subungual debris  from hallux to fifth toes bilaterally. No evidence of bacterial infection or drainage bilaterally.  Orthopedic  No limitations of motion  feet .  No crepitus or effusions noted.  No bony pathology or digital deformities noted.  Skin  normotropic skin with no porokeratosis noted bilaterally.  No signs of infections or ulcers noted.     Onychomycosis  Pain in toes right foot  Pain in toes left foot  Debridement  of nails  1-5  B/L with a nail nipper.  Nails were then filed using a dremel tool with no incidents.    RTC  4 months    Gregory Mayer DPM   

## 2023-06-12 ENCOUNTER — Encounter: Payer: Self-pay | Admitting: Podiatry

## 2023-07-13 ENCOUNTER — Ambulatory Visit: Payer: Medicare Other | Admitting: Podiatry

## 2023-07-23 ENCOUNTER — Encounter: Payer: Self-pay | Admitting: Podiatry

## 2023-07-23 ENCOUNTER — Ambulatory Visit: Admitting: Podiatry

## 2023-07-23 DIAGNOSIS — M79674 Pain in right toe(s): Secondary | ICD-10-CM | POA: Diagnosis not present

## 2023-07-23 DIAGNOSIS — B351 Tinea unguium: Secondary | ICD-10-CM | POA: Diagnosis not present

## 2023-07-23 DIAGNOSIS — M79675 Pain in left toe(s): Secondary | ICD-10-CM

## 2023-07-23 NOTE — Progress Notes (Signed)
This patient returns to the office for evaluation and treatment of long thick painful nails .  This patient is unable to trim his own nails since the patient cannot reach his feet.  Patient says the nails are painful walking and wearing his shoes. He presents to the office in a wheelchair accompanied by his mother.. Patient has history of CVA. He returns for preventive foot care services.  General Appearance  Alert, conversant and in no acute stress.  Vascular  Dorsalis pedis and posterior tibial  pulses are palpable  bilaterally.  Capillary return is within normal limits  bilaterally. Cold feet  bilaterally.  Neurologic  deferred since lacks motion due to CVA.  Nails Thick disfigured discolored nails with subungual debris  from hallux to fifth toes bilaterally. No evidence of bacterial infection or drainage bilaterally.  Orthopedic  No limitations of motion  feet .  No crepitus or effusions noted.  No bony pathology or digital deformities noted.  Skin  normotropic skin with no porokeratosis noted bilaterally.  No signs of infections or ulcers noted.     Onychomycosis  Pain in toes right foot  Pain in toes left foot  Debridement  of nails  1-5  B/L with a nail nipper.  Nails were then filed using a dremel tool with no incidents.    RTC  4 months    Gregory Mayer DPM   

## 2023-10-26 NOTE — Progress Notes (Signed)
 NEUROLOGY FOLLOW UP OFFICE NOTE  [name] [id number]  Assessment/Plan:   1.  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  Lamotrigine  100mg  daily 2.  Oxcarbazepine  150mg  twice daily 3.  Seroquel  300mg  in AM and 400mg  at bedtime.  Discussed black box warning with fiancee.  Request repeat EKG to evaluate QT interval.  They would like it performed in [place] at his PCP's office. 4.  Diazepam  5mg  every 12 hours 5.  Follow up one year  Total time spent on today's visit was 12 minutes dedicated to this patient today, preparing to see patient, examining the patient, counseling the patient, documenting clinical information in the EHR or other health record and coordinating care.     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  He is accompanied by his mother who supplements history.   UPDATE: Current medications:  Lamotrigine  100mg  daily, oxcarbazepine  150mg  BID, Seroquel  300mg  in AM and 400mg  QHS, Valium  5mg  Q12h, metoprolol  37.5mg  BID, thiamine  100mg  daily  Last seen in April 2023.   He is residing at [facility].  He is in a wheelchair and unable to walk without assistance.  He requires assistance with all ADLs.  He is incontinent.  He is more aware of his surroundings.  He is able to follow some commands such as brushing his teeth.  No longer receiving PT/OT/ST.  He needs assistance with ambulation and will walk when somebody is with him, such as his mother.     HISTORY: He presented to the [facility] ED on 09/28/2019 with altered mental status and fever of 104 F, WBC 17.4 and lactate 4.7 after a day of generalized malaise, nausea, vomiting, weakness and headache.  He had elevated troponins over 5000.  Due to  combativeness, he required sedation and subsequent intubation and transferred to [facility] where he underwent LP and diagnosed with pneumococcal meningitis.  He was started on ceftriaxone .  Due to possible seizure, he was started on Keppra  and placed on continuous EEG which was negative for seizure activity.  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  He received a 5 day course of SoluMedrol for cerebral edema.  He then developed exteensor posturing with tachycardia and hypertension concerning for elevated intracranial pressure.  He underwent Bolt ICP monitoring which demonstrated no elevated ICP.  Follow up CT head and subsequent repeat MRI of brain showed small subacute hemorrhage in the right suboccipital lobe, either hemorrhagic conversion of small ischemic stroke vs extension of previously seen microhemorrhage, as well as hypoxic injury involving the bilateral caudate and putamen.  He also sustained a NSTEMI.  He was extubated on 10/10/2019 and underwent slow taper of Dilaudid to minimize withdrawal symptoms.  A G-tube was inserted on 10/16/2019 and he was discharged on 10/18/2019 under the care of his mother who is a CNA.  EEG from 12/10/2019 showed generalized slowing but no epileptiform discharges.  We decided to continue AED but transitioned from Keppra  to Lamictal  for mood stabilization.  He was also started on Seroquel  (black box warning explained with mother).  However, he continued to become increasingly agitated and had to be hospitalized on 01/01/2020 for agitation.  CT head personally reviewed showed generalized atrophy but no acute abnormalities.  He was evaluated by psychiatry.  Oxcarbazepine  was started in addition to Lamictal  and was prescribed Valium   Testing: 07/25-26/2021 SERUM LABS:  Lactic acid 4.7, ammonia 20, ethanol negative, troponins 5105, CK 682, CK-MB 5.92, HIV nonreactive, Syphilis screen negative, COVID-19 PCR negative,  Cryptococcal antigen negative, C3 and C4 102 and 29.1,  09/28/2019 Urine:  UDS positive for cannabinoids and benzodiazepines 09/28/2019 CT HEAD/CERVICAL SPINE:  1. No acute intracranial abnormalities. 2. No evidence for cervical spine fracture or dislocation.  09/28/2019 ECHOCARDIOGRAM:    1. The left ventricular systolic function is borderline, LVEF is visually estimated at 50%.  2. Definity contrast would clarify LV function.  3. Right ventricle is not well visualized but probably normal.  4. No evidence of an interatrial communication or intrapulmonary shunt.  5. No significant valvular abnormalities.  6. Technically difficult study due to body habitus.  09/29/2019 CSF:  Cell count 3560, 93% neutrophil, protein 187, glucose 13, gram stain  And culture negative, acid fast stain negative, VDRL nonreactive, cryptococcal ag negative, fungal culture negative, VZV PCR negative, ACE negative, HSV 1 & 2 PCR negative, cytology with severe acute inflammation but negative for malignant cells. 09/29/2019 CT H EAD WO:  New multifocal cerebral edema with progressive mild effacement of the ventricles. Recommend correlation with forthcoming MRI.  09/29/2019 MRI BRAIN W WO:  1.Widespread acute microhemorrhagic leukoencephalopathy. Overall pattern is unusual, but given the perivascular distribution, is likely the result of infectious vasculitis.  2.Superimposed findings of meningitis, including leptomeningeal signal abnormality and enhancement.  09/29/2019 CTA/CTV HEAD W WO:  1. Unremarkable CTA of the head. Specifically, no CT angiogram evidence of small or medium vessel vasculitis.  2. No venous thromboses.  3. Extensive supratentorial white matter hypodensities are noted and correspond to the extensive parenchymal abnormalities seen on recent brain MRI. 09/30/2019 VIDEO EEG:  Severe diffuse cerebral dysfunction, initially slightly worse over the left hemisphere, which is nonspecific for etiology but could be due in part  to sedative effect in addition to known bilateral ischemic lesions   As the recording progressed, there is slight improvement in  background activity.  No seizures or epileptiform discharges.  There were occasional extensor posturing movements of both arms associated with up gaze lasting up to several minutes and no associated EEG change other than evidence of reactivity.  Most of these episodes occurred in response to stimulation  10/11/2019 CT HEAD WO:  1.26 x 18 x 30 mm acute hemorrhage in the right parietal lobe the hemorrhage extends to the ependyma of the posterior right lateral ventricle. No intraventricular hemorrhage or subarachnoid hemorrhage is seen.  2.Tiny acute hemorrhage in the right frontal cortex and the tiny acute hemorrhage in the cortex of the posterior superior left temporal lobe. 10/12/2019 MRI BRAIN W WO:  1. Acute parenchymal hemorrhages right parietal lobe and superficial right frontal lobe similar to recent CT scan. Increase in number of superficial supratentorial microhemorrhages.  2. Development of restricted diffusion and T2/FLAIR hyperintensity bilateral basal ganglia indicating acute to early subacute insult such as hypoxic injury or toxic/metabolic insult. Appearance is not suggestive of parenchymal changes of infection.  3. Maturation of the extensive restricted diffusion throughout the supratentorial white matter now seen as regions of enhancement and T2/FLAIR hyperintensity.  4. Leptomeningeal enhancement posteriorly at the vertex consistent with known meningitis; no abscess or space-occupying infected fluid collection.  10/15/2019 CT HEAD WO:  1. Evolving right parietal lobe parenchymal hematoma. The previously seen right frontal lobe hemorrhage is no longer definitively visualized. No  new parenchymal hemorrhage.  2. Subtle areas of hypodensity within periventricular and subcortical white matter, corresponding to some of the areas of restricted diffusion and abnormal  enhancement seen on prior MRI. However, these areas of white matter abnormality are much less conspicuous on CT compared to prior MR imaging.  3. No CT evidence of acute cortically based infarct.  10/15/2019 VIDEO EEG:  This EEG is abnormal secondary to moderate diffuse slowing.   PAST MEDICAL HISTORY: Past Medical History:  Diagnosis Date   History of bacterial meningitis 01/01/2020   NSTEMI (non-ST elevated myocardial infarction) ([facility]) 09/28/2019    MEDICATIONS: Current Outpatient Medications on File Prior to Visit  Medication Sig Dispense Refill   diazepam  (VALIUM ) 5 MG tablet Take 1 tablet (5 mg total) by mouth every 12 (twelve) hours. 60 tablet 0   feeding supplement (ENSURE ENLIVE / ENSURE PLUS) LIQD Take 237 mLs by mouth 2 (two) times daily between meals.     ferrous sulfate  325 (65 FE) MG tablet Take 1 tablet (325 mg total) by mouth daily with breakfast. 30 tablet 0   folic acid  (FOLVITE ) 1 MG tablet Take 1 tablet (1 mg total) by mouth daily. 30 tablet 0   lamoTRIgine  (LAMICTAL ) 100 MG tablet Take 1 tablet (100 mg total) by mouth daily. 30 tablet 0   Metoprolol  Tartrate 37.5 MG TABS Take 37.5 mg by mouth 2 (two) times daily. 60 tablet 0   OXcarbazepine  (TRILEPTAL ) 150 MG tablet Take 1 tablet (150 mg total) by mouth 2 (two) times daily. 60 tablet 0   polyethylene glycol (MIRALAX  / GLYCOLAX ) 17 g packet Take 17 g by mouth daily. Hold for diarrhea 30 each 1   QUEtiapine  (SEROQUEL ) 300 MG tablet Take 1 tablet (300 mg total) by mouth in the morning. 30 tablet 0   QUEtiapine  (SEROQUEL ) 400 MG tablet Take 1 tablet (400 mg total) by mouth at bedtime. (Patient taking differently: Take 400 mg by mouth once.) 30 tablet 0   senna-docusate (SENOKOT-S) 8.6-50 MG tablet Take 2 tablets by mouth at bedtime. Hold for diarrhea 20 tablet 0   thiamine  100 MG tablet Take 1 tablet (100 mg total) by mouth daily. 30 tablet 0   No current facility-administered medications on file prior to visit.     ALLERGIES: Allergies  Allergen Reactions   Lactose Intolerance (Gi)    Pork-Derived Products     FAMILY HISTORY: No family history on file.    Objective:  Blood pressure (!) 130/90, pulse 90. General: No acute distress.  Patient appears well-groomed.   Head:  Normocephalic/atraumatic Eyes:  Fundi examined but not visualized Neck: supple, full range of motion Heart:  Regular rate and rhythm Neurological Exam: alert and oriented to at least self.  Mixed expressive and receptive aphasia.  CN II-XII grossly intact intact.  Bulk and tone normal.  Muscle strength 5/5 throughout.  Deep tendon reflexes 3+ right upper extremity, otherwise 2+ throughout.  Unable to assess sensation or finger to nose.  Stands up from wheelchair without assistance.  Wide-based gait with short stride for few steps.  Juliene Dunnings, DO  CC: Travis Blanch, DO

## 2023-10-29 ENCOUNTER — Ambulatory Visit (INDEPENDENT_AMBULATORY_CARE_PROVIDER_SITE_OTHER): Admitting: Neurology

## 2023-10-29 ENCOUNTER — Encounter: Payer: Self-pay | Admitting: Neurology

## 2023-10-29 VITALS — BP 130/90 | HR 90

## 2023-10-29 DIAGNOSIS — G9349 Other encephalopathy: Secondary | ICD-10-CM

## 2023-11-26 ENCOUNTER — Ambulatory Visit: Admitting: Podiatry

## 2023-12-04 ENCOUNTER — Ambulatory Visit (INDEPENDENT_AMBULATORY_CARE_PROVIDER_SITE_OTHER): Admitting: Podiatry

## 2023-12-04 ENCOUNTER — Encounter: Payer: Self-pay | Admitting: Podiatry

## 2023-12-04 DIAGNOSIS — M79674 Pain in right toe(s): Secondary | ICD-10-CM

## 2023-12-04 DIAGNOSIS — M79675 Pain in left toe(s): Secondary | ICD-10-CM

## 2023-12-04 DIAGNOSIS — B351 Tinea unguium: Secondary | ICD-10-CM | POA: Diagnosis not present

## 2023-12-04 NOTE — Progress Notes (Signed)
This patient returns to the office for evaluation and treatment of long thick painful nails .  This patient is unable to trim his own nails since the patient cannot reach his feet.  Patient says the nails are painful walking and wearing his shoes. He presents to the office in a wheelchair accompanied by his mother.. Patient has history of CVA. He returns for preventive foot care services.  General Appearance  Alert, conversant and in no acute stress.  Vascular  Dorsalis pedis and posterior tibial  pulses are palpable  bilaterally.  Capillary return is within normal limits  bilaterally. Cold feet  bilaterally.  Neurologic  deferred since lacks motion due to CVA.  Nails Thick disfigured discolored nails with subungual debris  from hallux to fifth toes bilaterally. No evidence of bacterial infection or drainage bilaterally.  Orthopedic  No limitations of motion  feet .  No crepitus or effusions noted.  No bony pathology or digital deformities noted.  Skin  normotropic skin with no porokeratosis noted bilaterally.  No signs of infections or ulcers noted.     Onychomycosis  Pain in toes right foot  Pain in toes left foot  Debridement  of nails  1-5  B/L with a nail nipper.  Nails were then filed using a dremel tool with no incidents.    RTC  4 months    Gregory Mayer DPM   

## 2024-03-04 ENCOUNTER — Ambulatory Visit: Admitting: Podiatry

## 2024-05-02 ENCOUNTER — Ambulatory Visit: Admitting: Podiatry

## 2024-10-28 ENCOUNTER — Ambulatory Visit: Admitting: Neurology
